# Patient Record
Sex: Female | Born: 1945
Health system: Southern US, Community
[De-identification: ages and names within clinical notes are randomized; demographics above are authoritative.]

## PROBLEM LIST (undated history)

## (undated) DIAGNOSIS — M1990 Unspecified osteoarthritis, unspecified site: Secondary | ICD-10-CM

## (undated) DIAGNOSIS — D649 Anemia, unspecified: Secondary | ICD-10-CM

## (undated) DIAGNOSIS — R05 Cough: Secondary | ICD-10-CM

## (undated) DIAGNOSIS — G629 Polyneuropathy, unspecified: Secondary | ICD-10-CM

## (undated) DIAGNOSIS — R51 Headache: Secondary | ICD-10-CM

## (undated) DIAGNOSIS — E119 Type 2 diabetes mellitus without complications: Secondary | ICD-10-CM

## (undated) DIAGNOSIS — K589 Irritable bowel syndrome without diarrhea: Secondary | ICD-10-CM

## (undated) DIAGNOSIS — E1140 Type 2 diabetes mellitus with diabetic neuropathy, unspecified: Secondary | ICD-10-CM

## (undated) DIAGNOSIS — I10 Essential (primary) hypertension: Secondary | ICD-10-CM

## (undated) DIAGNOSIS — E785 Hyperlipidemia, unspecified: Secondary | ICD-10-CM

## (undated) DIAGNOSIS — R059 Cough, unspecified: Secondary | ICD-10-CM

## (undated) DIAGNOSIS — R569 Unspecified convulsions: Secondary | ICD-10-CM

## (undated) DIAGNOSIS — J45909 Unspecified asthma, uncomplicated: Secondary | ICD-10-CM

## (undated) DIAGNOSIS — E781 Pure hyperglyceridemia: Secondary | ICD-10-CM

## (undated) DIAGNOSIS — E7800 Pure hypercholesterolemia, unspecified: Secondary | ICD-10-CM

## (undated) HISTORY — DX: Unspecified osteoarthritis, unspecified site: M19.90

## (undated) HISTORY — PX: APPENDECTOMY: SHX54

## (undated) HISTORY — DX: Anemia, unspecified: D64.9

## (undated) HISTORY — DX: Cough: R05

## (undated) HISTORY — DX: Cough, unspecified: R05.9

## (undated) HISTORY — DX: Irritable bowel syndrome, unspecified: K58.9

## (undated) HISTORY — DX: Pure hypercholesterolemia, unspecified: E78.00

## (undated) HISTORY — DX: Pure hyperglyceridemia: E78.1

## (undated) HISTORY — DX: Unspecified asthma, uncomplicated: J45.909

## (undated) HISTORY — DX: Type 2 diabetes mellitus with diabetic neuropathy, unspecified: E11.40

## (undated) HISTORY — DX: Type 2 diabetes mellitus without complications: E11.9

---

## 1992-03-02 HISTORY — PX: COLON SURGERY: SHX602

## 1997-03-02 HISTORY — PX: BREAST SURGERY: SHX581

## 2000-01-30 ENCOUNTER — Other Ambulatory Visit: Admission: RE | Admit: 2000-01-30 | Discharge: 2000-01-30 | Payer: Self-pay | Admitting: Gastroenterology

## 2000-03-02 DIAGNOSIS — E781 Pure hyperglyceridemia: Secondary | ICD-10-CM

## 2000-03-02 HISTORY — DX: Pure hyperglyceridemia: E78.1

## 2000-07-02 ENCOUNTER — Other Ambulatory Visit: Admission: RE | Admit: 2000-07-02 | Discharge: 2000-07-02 | Payer: Self-pay | Admitting: Obstetrics and Gynecology

## 2000-07-09 ENCOUNTER — Ambulatory Visit (HOSPITAL_COMMUNITY): Admission: RE | Admit: 2000-07-09 | Discharge: 2000-07-09 | Payer: Self-pay | Admitting: Obstetrics and Gynecology

## 2000-07-09 ENCOUNTER — Encounter: Payer: Self-pay | Admitting: Obstetrics and Gynecology

## 2001-07-15 ENCOUNTER — Encounter: Payer: Self-pay | Admitting: Obstetrics and Gynecology

## 2001-07-15 ENCOUNTER — Ambulatory Visit (HOSPITAL_COMMUNITY): Admission: RE | Admit: 2001-07-15 | Discharge: 2001-07-15 | Payer: Self-pay | Admitting: Obstetrics and Gynecology

## 2001-08-26 ENCOUNTER — Ambulatory Visit (HOSPITAL_BASED_OUTPATIENT_CLINIC_OR_DEPARTMENT_OTHER): Admission: RE | Admit: 2001-08-26 | Discharge: 2001-08-26 | Payer: Self-pay | Admitting: Family Medicine

## 2001-12-05 ENCOUNTER — Encounter: Payer: Self-pay | Admitting: Family Medicine

## 2001-12-05 ENCOUNTER — Ambulatory Visit (HOSPITAL_COMMUNITY): Admission: RE | Admit: 2001-12-05 | Discharge: 2001-12-05 | Payer: Self-pay | Admitting: Family Medicine

## 2001-12-28 ENCOUNTER — Encounter: Payer: Self-pay | Admitting: Neurosurgery

## 2001-12-28 ENCOUNTER — Ambulatory Visit (HOSPITAL_COMMUNITY): Admission: RE | Admit: 2001-12-28 | Discharge: 2001-12-28 | Payer: Self-pay | Admitting: Neurosurgery

## 2002-01-25 ENCOUNTER — Encounter: Payer: Self-pay | Admitting: Neurosurgery

## 2002-01-30 ENCOUNTER — Inpatient Hospital Stay (HOSPITAL_COMMUNITY): Admission: RE | Admit: 2002-01-30 | Discharge: 2002-01-31 | Payer: Self-pay | Admitting: Neurosurgery

## 2002-01-30 ENCOUNTER — Encounter: Payer: Self-pay | Admitting: Neurosurgery

## 2002-01-30 HISTORY — PX: CERVICAL SPINE SURGERY: SHX589

## 2002-06-26 ENCOUNTER — Inpatient Hospital Stay (HOSPITAL_COMMUNITY): Admission: AD | Admit: 2002-06-26 | Discharge: 2002-07-05 | Payer: Self-pay | Admitting: Family Medicine

## 2002-06-26 ENCOUNTER — Encounter: Payer: Self-pay | Admitting: Family Medicine

## 2002-06-27 ENCOUNTER — Encounter: Payer: Self-pay | Admitting: Family Medicine

## 2002-06-28 ENCOUNTER — Encounter: Payer: Self-pay | Admitting: Family Medicine

## 2002-07-04 ENCOUNTER — Encounter: Payer: Self-pay | Admitting: Family Medicine

## 2002-10-06 ENCOUNTER — Encounter (INDEPENDENT_AMBULATORY_CARE_PROVIDER_SITE_OTHER): Payer: Self-pay | Admitting: Internal Medicine

## 2002-10-06 ENCOUNTER — Ambulatory Visit (HOSPITAL_COMMUNITY): Admission: RE | Admit: 2002-10-06 | Discharge: 2002-10-06 | Payer: Self-pay | Admitting: Internal Medicine

## 2002-11-28 ENCOUNTER — Encounter: Payer: Self-pay | Admitting: Family Medicine

## 2002-11-28 ENCOUNTER — Encounter (HOSPITAL_COMMUNITY): Admission: RE | Admit: 2002-11-28 | Discharge: 2002-12-28 | Payer: Self-pay | Admitting: Family Medicine

## 2003-05-04 ENCOUNTER — Ambulatory Visit (HOSPITAL_COMMUNITY): Admission: RE | Admit: 2003-05-04 | Discharge: 2003-05-04 | Payer: Self-pay | Admitting: Family Medicine

## 2010-03-22 ENCOUNTER — Encounter: Payer: Self-pay | Admitting: Family Medicine

## 2010-08-12 ENCOUNTER — Other Ambulatory Visit: Payer: Self-pay | Admitting: Family Medicine

## 2010-08-12 DIAGNOSIS — Z139 Encounter for screening, unspecified: Secondary | ICD-10-CM

## 2010-08-18 ENCOUNTER — Ambulatory Visit (HOSPITAL_COMMUNITY): Payer: Medicare Other

## 2010-08-21 ENCOUNTER — Ambulatory Visit (HOSPITAL_COMMUNITY)
Admission: RE | Admit: 2010-08-21 | Discharge: 2010-08-21 | Disposition: A | Discharge status: Home / Self Care | Payer: Medicare Other | Source: Ambulatory Visit | Attending: Family Medicine | Admitting: Family Medicine

## 2010-08-21 DIAGNOSIS — Z1231 Encounter for screening mammogram for malignant neoplasm of breast: Secondary | ICD-10-CM | POA: Insufficient documentation

## 2010-08-21 DIAGNOSIS — Z139 Encounter for screening, unspecified: Secondary | ICD-10-CM

## 2010-10-27 ENCOUNTER — Ambulatory Visit (INDEPENDENT_AMBULATORY_CARE_PROVIDER_SITE_OTHER): Payer: Medicare Other | Admitting: Internal Medicine

## 2010-10-27 ENCOUNTER — Encounter (INDEPENDENT_AMBULATORY_CARE_PROVIDER_SITE_OTHER): Payer: Self-pay | Admitting: Internal Medicine

## 2010-10-27 ENCOUNTER — Telehealth (INDEPENDENT_AMBULATORY_CARE_PROVIDER_SITE_OTHER): Payer: Self-pay | Admitting: *Deleted

## 2010-10-27 VITALS — BP 144/72 | HR 72 | Temp 97.0°F | Ht 68.25 in | Wt 219.5 lb

## 2010-10-27 DIAGNOSIS — K625 Hemorrhage of anus and rectum: Secondary | ICD-10-CM

## 2010-10-27 MED ORDER — PEG-KCL-NACL-NASULF-NA ASC-C 100 G PO SOLR: 1.0000 | Freq: Once | ORAL | Status: DC

## 2010-10-27 NOTE — Telephone Encounter (Signed)
TCS sch'd 12/04/10 @ 3:00 (2:00), split movi prep given  ASA 2 days, hold evening dose of Metformin & glyburide evening before

## 2010-10-27 NOTE — Progress Notes (Signed)
Subjective:     Patient ID: Maria Esparza, female   DOB: 18-Jul-1945, 65 y.o.   MRN: [id number]  HPI  She was referred by [facility] for rectal bleeding. Her rectal bleeding started July 8 and has continued. She is not seeing rectal on a daily basis. She did have a small amount of blood today.   She see the blood when she wipes.  She also c/o diarrhea since July.  Her stool are soft, formed and then she will have diarrhea.   No acid reflux. She is having about 3 stools a day since July.  She tells me her stools are smaller. Her stools are brown in color. No abdominal pain. She says her abdomen rumbles.  Appetite good. Weight loss of 9-10 pounds which was intentional.. No melena.   Her last colonoscopy ? 2002.   08/18/2010 NA 140, K 4.9, Chloride 102, Glucose 168, BUN 13, Creatinine 0.85, ALP 75, AST 31, ALT 29, Total protein 7.3, albumin 4.3. Total bili 0.3  Review of Systems see hpi  Current Outpatient Prescriptions  Medication Sig Dispense Refill  . aspirin 81 MG tablet Take 81 mg by mouth daily.        . calcium carbonate (OS-CAL) 600 MG TABS Take 600 mg by mouth 2 (two) times daily before a meal. With Vitamin D       . fish oil-omega-3 fatty acids 1000 MG capsule Take 1 g by mouth daily.        Marland Kitchen glyBURIDE (DIABETA) 5 MG tablet Take 10 mg by mouth 2 (two) times daily before a meal.       . metFORMIN (GLUCOPHAGE) 500 MG tablet Take 500 mg by mouth 3 (three) times daily. Two tablet in am, one tablet at lunch and two tablets at night       . pravastatin (PRAVACHOL) 20 MG tablet Take 20 mg by mouth daily.         Past Medical History  Diagnosis Date  . Diabetes mellitus type 2, uncomplicated     x 10 yrs  . Hypercholesteremia    Past Surgical History  Procedure Date  . Colon surgery     for unknown reason.  They thought she had a mass.             Physical Exam Blood pressure 144/72, pulse 72, temperature 97 F (36.1 C), height 5' 8.25" (1.734 m), weight 219 lb 8 oz (99.565  kg). Alert and oriented. Skin warm and dry. Oral mucosa is moist. Upper and lower dentures.. Sclera anicteric, conjunctivae is pink. Thyroid not enlarged. No cervical lymphadenopathy. Lungs clear. Heart regular rate and rhythm.  Abdomen is soft. Bowel sounds are positive. Rt lower abdominal scar from previous colon surgery for a possible mass.  No hepatomegaly. No abdominal masses felt. No tenderness.  No edema to lower extremities. Patient is alert and oriented.      Assessment:   Rectal bleeding since July. Colonic neoplasm, AVM, carcinoma needs to be ruled out.   (Patient has  had 10 inches of her colon removed for possible  Mass yrs ago)     Plan:     Colonoscopy will be scheduled.The risks and benefits such as perforation, bleeding, and infection were reviewed with the patient and is agreeable.

## 2010-11-11 ENCOUNTER — Ambulatory Visit (INDEPENDENT_AMBULATORY_CARE_PROVIDER_SITE_OTHER): Payer: Medicare Other | Admitting: Internal Medicine

## 2010-12-03 MED ORDER — SODIUM CHLORIDE 0.45 % IV SOLN
Freq: Once | INTRAVENOUS | Status: AC
  Administered 2010-12-04: 14:00:00 via INTRAVENOUS

## 2010-12-04 ENCOUNTER — Ambulatory Visit (HOSPITAL_COMMUNITY)
Admission: RE | Admit: 2010-12-04 | Discharge: 2010-12-04 | Disposition: A | Discharge status: Home / Self Care | Payer: Medicare Other | Source: Ambulatory Visit | Attending: Internal Medicine | Admitting: Internal Medicine

## 2010-12-04 ENCOUNTER — Encounter (HOSPITAL_COMMUNITY)
Admission: RE | Disposition: A | Discharge status: Home / Self Care | Payer: Self-pay | Source: Ambulatory Visit | Attending: Internal Medicine

## 2010-12-04 ENCOUNTER — Encounter (HOSPITAL_COMMUNITY): Payer: Self-pay | Admitting: *Deleted

## 2010-12-04 DIAGNOSIS — K644 Residual hemorrhoidal skin tags: Secondary | ICD-10-CM

## 2010-12-04 DIAGNOSIS — K921 Melena: Secondary | ICD-10-CM | POA: Insufficient documentation

## 2010-12-04 DIAGNOSIS — K625 Hemorrhage of anus and rectum: Secondary | ICD-10-CM

## 2010-12-04 DIAGNOSIS — R197 Diarrhea, unspecified: Secondary | ICD-10-CM

## 2010-12-04 DIAGNOSIS — E119 Type 2 diabetes mellitus without complications: Secondary | ICD-10-CM | POA: Insufficient documentation

## 2010-12-04 DIAGNOSIS — E785 Hyperlipidemia, unspecified: Secondary | ICD-10-CM | POA: Insufficient documentation

## 2010-12-04 DIAGNOSIS — I10 Essential (primary) hypertension: Secondary | ICD-10-CM | POA: Insufficient documentation

## 2010-12-04 DIAGNOSIS — Z980 Intestinal bypass and anastomosis status: Secondary | ICD-10-CM | POA: Insufficient documentation

## 2010-12-04 DIAGNOSIS — Z7982 Long term (current) use of aspirin: Secondary | ICD-10-CM | POA: Insufficient documentation

## 2010-12-04 DIAGNOSIS — Z79899 Other long term (current) drug therapy: Secondary | ICD-10-CM | POA: Insufficient documentation

## 2010-12-04 HISTORY — DX: Essential (primary) hypertension: I10

## 2010-12-04 HISTORY — DX: Headache: R51

## 2010-12-04 HISTORY — DX: Hyperlipidemia, unspecified: E78.5

## 2010-12-04 HISTORY — PX: COLONOSCOPY: SHX5424

## 2010-12-04 SURGERY — COLONOSCOPY
Anesthesia: Moderate Sedation

## 2010-12-04 MED ORDER — MEPERIDINE HCL 50 MG/ML IJ SOLN
INTRAMUSCULAR | Status: AC
  Filled 2010-12-04: qty 1

## 2010-12-04 MED ORDER — MIDAZOLAM HCL 5 MG/5ML IJ SOLN
INTRAMUSCULAR | Status: DC | PRN
  Administered 2010-12-04: 1 mg via INTRAVENOUS
  Administered 2010-12-04 (×2): 2 mg via INTRAVENOUS

## 2010-12-04 MED ORDER — MIDAZOLAM HCL 5 MG/5ML IJ SOLN
INTRAMUSCULAR | Status: AC
  Filled 2010-12-04: qty 10

## 2010-12-04 MED ORDER — MEPERIDINE HCL 50 MG/ML IJ SOLN
INTRAMUSCULAR | Status: DC | PRN
  Administered 2010-12-04 (×2): 25 mg

## 2010-12-04 NOTE — OR Nursing (Signed)
At anastomosis at  1419. Per Dr. Karilyn Cota

## 2010-12-04 NOTE — H&P (Signed)
Maria Esparza is an 65 y.o. female.   Chief Complaint: Patient is here for colonoscopy HPI: Patient is 65 year old Caucasian female with intermittent rectal bleeding. Symptoms started about 3 months ago. She's also noted diarrhea for the same duration bowel frequency of once stool per day to many. He passes blood with her bowel movements and other times it does seem somewhat and she is using a pad. While her abdomen sometimes rumbles that she has not had any cramps or pain. She denies nausea vomiting fever or chills. she has not lost any weight since the symptoms began. No history of recent antibiotic use. Patient's loss colonoscopy was about 10 years ago and she believes she had few polyps removed.  Past Medical History  Diagnosis Date  . Diabetes mellitus type 2, uncomplicated     x 10 yrs  . Hypercholesteremia   . Hyperlipidemia   . Hypertension   . Diabetes mellitus   . Headache     Past Surgical History  Procedure Date  . Colon surgery 1994    for unknown reason.  They thought she had a mass.    Family History  Problem Relation Age of Onset  . Adopted: Yes   Social History:  reports that she has quit smoking. She does not have any smokeless tobacco history on file. She reports that she drinks alcohol. She reports that she does not use illicit drugs.  Allergies:  Allergies  Allergen Reactions  . Penicillins Other (See Comments)    Vomiting    Medications Prior to Admission  Medication Dose Route Frequency Provider Last Rate Last Dose  . 0.45 % sodium chloride infusion   Intravenous Once Malissa Hippo, MD 20 mL/hr at 12/04/10 1338    . meperidine (DEMEROL) 50 MG/ML injection           . midazolam (VERSED) 5 MG/5ML injection            Medications Prior to Admission  Medication Sig Dispense Refill  . aspirin EC 81 MG tablet Take 81 mg by mouth daily.        . Calcium Carbonate-Vitamin D (CALCIUM + D) 600-200 MG-UNIT TABS Take 1 tablet by mouth daily.          No  results found for this or any previous visit (from the past 48 hour(s)). No results found.  Review of Systems  Constitutional: Negative for weight loss.  Gastrointestinal: Positive for diarrhea and blood in stool. Negative for nausea, vomiting, abdominal pain, constipation and melena.    Blood pressure 139/96, pulse 76, temperature 97.6 F (36.4 C), temperature source Oral, resp. rate 20, height 5\' 8"  (1.727 m), weight 219 lb (99.338 kg), SpO2 98.00%. Physical Exam  Constitutional: She appears well-developed and well-nourished.  HENT:  Mouth/Throat: Oropharynx is clear and moist.  Eyes: Conjunctivae are normal. No scleral icterus.  Neck: No thyromegaly present.  Cardiovascular: Normal rate, regular rhythm and normal heart sounds.   No murmur heard. Respiratory: Effort normal and breath sounds normal.  GI: Soft. She exhibits no distension and no mass. There is no tenderness.  Musculoskeletal: She exhibits no edema.  Lymphadenopathy:    She has no cervical adenopathy.  Neurological: She is alert.  Skin: Skin is dry.     Assessment/Plan Rectal bleeding and diarrhea. Diagnostic colonoscopy  Myldred Raju U 12/04/2010, 1:53 PM

## 2010-12-04 NOTE — Op Note (Signed)
COLONOSCOPY PROCEDURE REPORT  PATIENT:  Maria Esparza [facility]  MR#:  [id number] Birthdate:  28-Nov-1945, 65 y.o., female Endoscopist:  Dr. Malissa Hippo, MD Referred By:  Dr. Merlyn Albert, MD Procedure Date: 12/04/2010  Procedure:   Colonoscopy  Indications:  Patient is 65 year old Caucasian female who's been experiencing diarrhea and rectal bleeding for the last couple of months. She had colonic surgery in 1994 details of which are not available.  Informed Consent: Procedure risks were reviewed the patient and informed consent was obtained.  Medications:  Demerol 50 mg IV Versed 5 mg IV  Description of procedure:  After a digital rectal exam was performed, that colonoscope was advanced from the anus through the rectum and colon to the area of ileocolonic anastomosis located in the region of hepatic flexure or a sending colon. Short segment of ileum was. The mucosal surfaces were carefully surveyed utilizing scope tip to flexion to facilitate fold flattening as needed. The scope was pulled down into the rectum where a thorough exam including retroflexion was performed.  Findings:   Prep excellent. Patent ileocolonic anastomosis located either in the region of hepatic flexure or ascending colon. Normal colonic mucosa without polyps or other abnormalities. Normal rectal mucosa. Hemorrhoids  below the dentate line along with thickening to anoderm.  Therapeutic/Diagnostic Maneuvers Performed:  None  Complications:  None  Cecal Withdrawal Time:  9 minutes  Impression:  Examination performed to ileocolonic anastomosis located either in the region of hepatic flexure ascending colon. Normal neoterminal ileum. Normal colonic mucosa. External hemorrhoids  Recommendations:  Anusol-HC suppository 1 per rectum at bedtime daily for 2 weeks. Imodium OTC 2 mg every morning. Office visit in 4 weeks.  [name] U  12/04/2010 2:28 PM  CC: Dr. Harlow Asa, MD, MD & Dr. Bonnetta Barry ref. provider  found

## 2010-12-08 ENCOUNTER — Encounter (INDEPENDENT_AMBULATORY_CARE_PROVIDER_SITE_OTHER): Payer: Self-pay | Admitting: *Deleted

## 2010-12-15 ENCOUNTER — Encounter (HOSPITAL_COMMUNITY): Payer: Self-pay | Admitting: Internal Medicine

## 2010-12-22 ENCOUNTER — Ambulatory Visit (INDEPENDENT_AMBULATORY_CARE_PROVIDER_SITE_OTHER): Payer: Medicare Other | Admitting: Internal Medicine

## 2010-12-23 ENCOUNTER — Ambulatory Visit (INDEPENDENT_AMBULATORY_CARE_PROVIDER_SITE_OTHER): Payer: Medicare Other | Admitting: Internal Medicine

## 2011-01-06 ENCOUNTER — Encounter (INDEPENDENT_AMBULATORY_CARE_PROVIDER_SITE_OTHER): Payer: Self-pay | Admitting: Internal Medicine

## 2011-01-06 ENCOUNTER — Ambulatory Visit (HOSPITAL_COMMUNITY)
Admission: RE | Admit: 2011-01-06 | Discharge: 2011-01-06 | Disposition: A | Discharge status: Home / Self Care | Payer: Medicare Other | Source: Ambulatory Visit | Attending: Family Medicine | Admitting: Family Medicine

## 2011-01-06 ENCOUNTER — Other Ambulatory Visit: Payer: Self-pay | Admitting: Family Medicine

## 2011-01-06 ENCOUNTER — Ambulatory Visit (INDEPENDENT_AMBULATORY_CARE_PROVIDER_SITE_OTHER): Payer: Medicare Other | Admitting: Internal Medicine

## 2011-01-06 VITALS — BP 162/90 | Temp 97.6°F | Ht 68.0 in | Wt 219.0 lb

## 2011-01-06 DIAGNOSIS — E7800 Pure hypercholesterolemia, unspecified: Secondary | ICD-10-CM | POA: Insufficient documentation

## 2011-01-06 DIAGNOSIS — E119 Type 2 diabetes mellitus without complications: Secondary | ICD-10-CM | POA: Insufficient documentation

## 2011-01-06 DIAGNOSIS — I10 Essential (primary) hypertension: Secondary | ICD-10-CM | POA: Insufficient documentation

## 2011-01-06 DIAGNOSIS — K625 Hemorrhage of anus and rectum: Secondary | ICD-10-CM

## 2011-01-06 DIAGNOSIS — R519 Headache, unspecified: Secondary | ICD-10-CM | POA: Insufficient documentation

## 2011-01-06 DIAGNOSIS — R51 Headache: Secondary | ICD-10-CM | POA: Insufficient documentation

## 2011-01-06 DIAGNOSIS — R059 Cough, unspecified: Secondary | ICD-10-CM

## 2011-01-06 DIAGNOSIS — R0789 Other chest pain: Secondary | ICD-10-CM | POA: Insufficient documentation

## 2011-01-06 DIAGNOSIS — R05 Cough: Secondary | ICD-10-CM | POA: Insufficient documentation

## 2011-01-06 DIAGNOSIS — K644 Residual hemorrhoidal skin tags: Secondary | ICD-10-CM

## 2011-01-06 DIAGNOSIS — E785 Hyperlipidemia, unspecified: Secondary | ICD-10-CM | POA: Insufficient documentation

## 2011-01-06 NOTE — Progress Notes (Signed)
Subjective:     Patient ID: Maria Esparza, female   DOB: December 26, 1945, 65 y.o.   MRN: [id number]   HPI  Here today for f/u after undergoing a colonoscopy 10/12. She underwent the colonoscopy for rectal bleeding.  She says she has had some rectal bleeding. Her stools are hard sometimes. Some are soft.  First  Stool is hard then the rest are liquidity. Her appetite is good. No weight loss. She denies melena or rectal bleeding.  Colonoscopy: 12/2010  Impression:  Examination performed to ileocolonic anastomosis located either in the region of hepatic flexure ascending colon.  Normal neoterminal ileum.  Normal colonic mucosa.  External hemorrhoids  Recommendations:  Anusol-HC suppository 1 per rectum at bedtime daily for 2 weeks.  Imodium OTC 2 mg every morning.  Office visit in 4 weeks.  REHMAN,NAJEEB U 12/04/2010 2:28 PM  Review of Systems Current Outpatient Prescriptions  Medication Sig Dispense Refill  . aspirin 81 MG tablet Take 81 mg by mouth daily.        . calcium carbonate (OS-CAL) 600 MG TABS Take 600 mg by mouth 2 (two) times daily before a meal. With Vitamin D       . Calcium Carbonate-Vitamin D (CALCIUM + D) 600-200 MG-UNIT TABS Take 1 tablet by mouth daily.        Marland Kitchen doxycycline (VIBRA-TABS) 100 MG tablet Take 100 mg by mouth 2 (two) times daily.        . fish oil-omega-3 fatty acids 1000 MG capsule Take 1 g by mouth daily.        Marland Kitchen glyBURIDE (DIABETA) 5 MG tablet Take 10 mg by mouth 2 (two) times daily before a meal.       . metFORMIN (GLUCOPHAGE) 500 MG tablet Take 500 mg by mouth 3 (three) times daily. Two tablet in am, one tablet at lunch and two tablets at night       . peg 3350 powder (MOVIPREP) 100 G SOLR Take 1 kit (100 g total) by mouth once.  1 kit  0  . pravastatin (PRAVACHOL) 20 MG tablet Take 20 mg by mouth daily.        Marland Kitchen aspirin EC 81 MG tablet Take 81 mg by mouth daily.         Past Surgical History  Procedure Date  . Colon surgery 1994    for unknown  reason.  They thought she had a mass.  . Colonoscopy 12/04/2010    Procedure: COLONOSCOPY;  Surgeon: Malissa Hippo, MD;  Location: [facility];  Service: Endoscopy;  Laterality: N/A;  3:00   Past Medical History  Diagnosis Date  . Diabetes mellitus type 2, uncomplicated     x 10 yrs  . Hypercholesteremia   . Hyperlipidemia   . Hypertension   . Diabetes mellitus   . Headache    History   Social History  . Marital Status: Married    Spouse Name: N/A    Number of Children: N/A  . Years of Education: N/A   Occupational History  . Not on file.   Social History Main Topics  . Smoking status: Former Smoker -- 3.0 packs/day for 20 years  . Smokeless tobacco: Not on file   Comment: Quit smoking in early 80s after smoking 20 yrs,.  . Alcohol Use: Yes     very rare occasion on a holiday  . Drug Use: No  . Sexually Active: Not on file   Other Topics Concern  . Not  on file   Social History Narrative  . No narrative on file   . Family Status  Relation Status Death Age  . Mother      Patient is adopted       Objective:   Physical Exam Filed Vitals:   01/06/11 1049  BP: 162/90  Temp: 97.6 F (36.4 C)  Height: 5\' 8"  (1.727 m)  Weight: 219 lb (99.338 kg)    Alert and oriented. Skin warm and dry. Oral mucosa is moist. Natural teeth in good condition. Sclera anicteric, conjunctivae is pink. Thyroid not enlarged. No cervical lymphadenopathy. Lungs clear. Heart regular rate and rhythm.  Abdomen is soft. Bowel sounds are positive. No hepatomegaly. No abdominal masses felt. No tenderness.  No edema to lower extremities. Patient is alert and oriented.      Assessment:   Rectal bleeding, probably hemorrhoidal.  Normal colonoscopy.     Plan:     Increase fiber in diet.Stool softner twice a day. She will follow up in one year. No straining.

## 2011-05-13 DIAGNOSIS — H6120 Impacted cerumen, unspecified ear: Secondary | ICD-10-CM | POA: Diagnosis not present

## 2011-06-10 DIAGNOSIS — E785 Hyperlipidemia, unspecified: Secondary | ICD-10-CM | POA: Diagnosis not present

## 2011-06-10 DIAGNOSIS — E119 Type 2 diabetes mellitus without complications: Secondary | ICD-10-CM | POA: Diagnosis not present

## 2011-06-17 DIAGNOSIS — E782 Mixed hyperlipidemia: Secondary | ICD-10-CM | POA: Diagnosis not present

## 2011-06-17 DIAGNOSIS — Z79899 Other long term (current) drug therapy: Secondary | ICD-10-CM | POA: Diagnosis not present

## 2011-06-17 DIAGNOSIS — E119 Type 2 diabetes mellitus without complications: Secondary | ICD-10-CM | POA: Diagnosis not present

## 2011-06-30 ENCOUNTER — Encounter (INDEPENDENT_AMBULATORY_CARE_PROVIDER_SITE_OTHER): Payer: Self-pay

## 2011-08-26 ENCOUNTER — Other Ambulatory Visit: Payer: Self-pay | Admitting: Family Medicine

## 2011-08-26 DIAGNOSIS — Z139 Encounter for screening, unspecified: Secondary | ICD-10-CM

## 2011-08-31 ENCOUNTER — Ambulatory Visit (HOSPITAL_COMMUNITY): Payer: Medicare Other

## 2011-09-08 ENCOUNTER — Ambulatory Visit (HOSPITAL_COMMUNITY)
Admission: RE | Admit: 2011-09-08 | Discharge: 2011-09-08 | Disposition: A | Discharge status: Home / Self Care | Payer: Medicare Other | Source: Ambulatory Visit | Attending: Family Medicine | Admitting: Family Medicine

## 2011-09-08 DIAGNOSIS — Z1231 Encounter for screening mammogram for malignant neoplasm of breast: Secondary | ICD-10-CM | POA: Insufficient documentation

## 2011-09-08 DIAGNOSIS — Z139 Encounter for screening, unspecified: Secondary | ICD-10-CM

## 2011-09-25 DIAGNOSIS — Z0000 Encounter for general adult medical examination without abnormal findings: Secondary | ICD-10-CM | POA: Diagnosis not present

## 2011-09-25 DIAGNOSIS — E119 Type 2 diabetes mellitus without complications: Secondary | ICD-10-CM | POA: Diagnosis not present

## 2011-09-25 DIAGNOSIS — Z9189 Other specified personal risk factors, not elsewhere classified: Secondary | ICD-10-CM | POA: Diagnosis not present

## 2011-11-11 DIAGNOSIS — H2510 Age-related nuclear cataract, unspecified eye: Secondary | ICD-10-CM | POA: Diagnosis not present

## 2011-12-09 DIAGNOSIS — L851 Acquired keratosis [keratoderma] palmaris et plantaris: Secondary | ICD-10-CM | POA: Diagnosis not present

## 2011-12-09 DIAGNOSIS — E1149 Type 2 diabetes mellitus with other diabetic neurological complication: Secondary | ICD-10-CM | POA: Diagnosis not present

## 2011-12-09 DIAGNOSIS — B351 Tinea unguium: Secondary | ICD-10-CM | POA: Diagnosis not present

## 2011-12-09 DIAGNOSIS — M2040 Other hammer toe(s) (acquired), unspecified foot: Secondary | ICD-10-CM | POA: Diagnosis not present

## 2011-12-21 DIAGNOSIS — E785 Hyperlipidemia, unspecified: Secondary | ICD-10-CM | POA: Diagnosis not present

## 2011-12-21 DIAGNOSIS — E119 Type 2 diabetes mellitus without complications: Secondary | ICD-10-CM | POA: Diagnosis not present

## 2011-12-21 DIAGNOSIS — S8490XA Injury of unspecified nerve at lower leg level, unspecified leg, initial encounter: Secondary | ICD-10-CM | POA: Diagnosis not present

## 2011-12-25 DIAGNOSIS — E782 Mixed hyperlipidemia: Secondary | ICD-10-CM | POA: Diagnosis not present

## 2011-12-25 DIAGNOSIS — Z79899 Other long term (current) drug therapy: Secondary | ICD-10-CM | POA: Diagnosis not present

## 2012-01-06 ENCOUNTER — Encounter (INDEPENDENT_AMBULATORY_CARE_PROVIDER_SITE_OTHER): Payer: Self-pay | Admitting: *Deleted

## 2012-01-26 ENCOUNTER — Ambulatory Visit (INDEPENDENT_AMBULATORY_CARE_PROVIDER_SITE_OTHER): Payer: Medicare Other | Admitting: Internal Medicine

## 2012-01-26 DIAGNOSIS — J42 Unspecified chronic bronchitis: Secondary | ICD-10-CM | POA: Diagnosis not present

## 2012-02-17 DIAGNOSIS — E1149 Type 2 diabetes mellitus with other diabetic neurological complication: Secondary | ICD-10-CM | POA: Diagnosis not present

## 2012-02-17 DIAGNOSIS — M2040 Other hammer toe(s) (acquired), unspecified foot: Secondary | ICD-10-CM | POA: Diagnosis not present

## 2012-02-17 DIAGNOSIS — B351 Tinea unguium: Secondary | ICD-10-CM | POA: Diagnosis not present

## 2012-02-17 DIAGNOSIS — L851 Acquired keratosis [keratoderma] palmaris et plantaris: Secondary | ICD-10-CM | POA: Diagnosis not present

## 2012-03-11 DIAGNOSIS — J019 Acute sinusitis, unspecified: Secondary | ICD-10-CM | POA: Diagnosis not present

## 2012-03-11 DIAGNOSIS — J111 Influenza due to unidentified influenza virus with other respiratory manifestations: Secondary | ICD-10-CM | POA: Diagnosis not present

## 2012-03-31 ENCOUNTER — Encounter (INDEPENDENT_AMBULATORY_CARE_PROVIDER_SITE_OTHER): Payer: Self-pay | Admitting: Internal Medicine

## 2012-03-31 ENCOUNTER — Ambulatory Visit (INDEPENDENT_AMBULATORY_CARE_PROVIDER_SITE_OTHER): Payer: Medicare Other | Admitting: Internal Medicine

## 2012-03-31 VITALS — BP 150/64 | HR 60 | Temp 98.1°F | Ht 68.0 in | Wt 219.9 lb

## 2012-03-31 DIAGNOSIS — R197 Diarrhea, unspecified: Secondary | ICD-10-CM | POA: Diagnosis not present

## 2012-03-31 NOTE — Patient Instructions (Addendum)
Follow up on a prn basis 

## 2012-03-31 NOTE — Progress Notes (Signed)
Subjective:     Patient ID: Maria Esparza, female   DOB: [birth year], 67 y.o.   MRN: [id number]  HPI Here today for f/u. She tells me she is doing good. She had the flu a couple of weeks. She had diarrhea associated with her symptoms. She also became very weak. She was seen by her PCP and was given an antibiotic and a cough medication Appetite is good. No dysphagia. No acid reflux. No abdominal pain. BMs are good. She usually has a BM one every 1-2 days. No melena or bright red rectal bleeding. She did see some bleeding when she had a bout of diarrhea but this cleared.  She had some type of colon mass years ago underwent surgery at [place]. Maria Esparza in [place], [place] in 1988.  She says this mass was not cancer   12/2010 Colonoscopy: Cecal Withdrawal Time: 9 minutes  Impression:  Examination performed to ileocolonic anastomosis located either in the region of hepatic flexure ascending colon.  Normal neoterminal ileum.  Normal colonic mucosa.  External hemorrhoids  Review of Systems see hpi Current Outpatient Prescriptions  Medication Sig Dispense Refill  . aspirin 81 MG tablet Take 81 mg by mouth daily.        . calcium carbonate (OS-CAL) 600 MG TABS Take 600 mg by mouth 2 (two) times daily before a meal. With Vitamin D       . fish oil-omega-3 fatty acids 1000 MG capsule Take 1 g by mouth daily.        Marland Kitchen glyBURIDE (DIABETA) 5 MG tablet Take 10 mg by mouth 2 (two) times daily before a meal.       . metFORMIN (GLUCOPHAGE) 500 MG tablet Take 500 mg by mouth 3 (three) times daily. Two tablet in am, one tablet at lunch and two tablets at night       . pravastatin (PRAVACHOL) 20 MG tablet Take 20 mg by mouth daily.        Marland Kitchen aspirin EC 81 MG tablet Take 81 mg by mouth daily.        . Calcium Carbonate-Vitamin D (CALCIUM + D) 600-200 MG-UNIT TABS Take 1 tablet by mouth daily.        Marland Kitchen doxycycline (VIBRA-TABS) 100 MG tablet Take 100 mg by mouth 2 (two) times daily.         Past Medical  History  Diagnosis Date  . Hypercholesteremia   . Hypertension   . Diabetes mellitus   . Headache   . Diabetes mellitus type 2, uncomplicated     x 10 yrs  . Hyperlipidemia    Past Surgical History  Procedure Date  . Colon surgery 1994    for unknown reason.  They thought she had a mass.  . Colonoscopy 12/04/2010    Procedure: COLONOSCOPY;  Surgeon: Malissa Hippo, MD;  Location: [facility];  Service: Endoscopy;  Laterality: N/A;  3:00   Allergies  Allergen Reactions  . Penicillins Other (See Comments)    Vomiting         Objective:   Physical Exam Filed Vitals:   03/31/12 1352  BP: 150/64  Pulse: 60  Temp: 98.1 F (36.7 C)  Height: 5\' 8"  (1.727 m)  Weight: 219 lb 14.4 oz (99.746 kg)  Alert and oriented. Skin warm and dry. Oral mucosa is moist.   . Sclera anicteric, conjunctivae is pink. Thyroid not enlarged. No cervical lymphadenopathy. Lungs clear. Heart regular rate and rhythm.  Abdomen is soft. Bowel sounds  are positive. No hepatomegaly. No abdominal masses felt. No tenderness.  No edema to lower extremities.       Assessment:    Normal exam. Diarrhea has now resolved.    Plan:    Will follow up on a prn basis.

## 2012-04-06 ENCOUNTER — Other Ambulatory Visit: Payer: Self-pay | Admitting: Family Medicine

## 2012-04-06 ENCOUNTER — Ambulatory Visit (HOSPITAL_COMMUNITY)
Admission: RE | Admit: 2012-04-06 | Discharge: 2012-04-06 | Disposition: A | Discharge status: Home / Self Care | Payer: Medicare Other | Source: Ambulatory Visit | Attending: Family Medicine | Admitting: Family Medicine

## 2012-04-06 DIAGNOSIS — M25579 Pain in unspecified ankle and joints of unspecified foot: Secondary | ICD-10-CM | POA: Diagnosis not present

## 2012-04-06 DIAGNOSIS — M79671 Pain in right foot: Secondary | ICD-10-CM

## 2012-04-06 DIAGNOSIS — R209 Unspecified disturbances of skin sensation: Secondary | ICD-10-CM | POA: Diagnosis not present

## 2012-04-06 DIAGNOSIS — G5790 Unspecified mononeuropathy of unspecified lower limb: Secondary | ICD-10-CM | POA: Diagnosis not present

## 2012-04-06 DIAGNOSIS — M7730 Calcaneal spur, unspecified foot: Secondary | ICD-10-CM | POA: Insufficient documentation

## 2012-04-06 DIAGNOSIS — M779 Enthesopathy, unspecified: Secondary | ICD-10-CM | POA: Diagnosis not present

## 2012-04-27 DIAGNOSIS — B351 Tinea unguium: Secondary | ICD-10-CM | POA: Diagnosis not present

## 2012-04-27 DIAGNOSIS — L851 Acquired keratosis [keratoderma] palmaris et plantaris: Secondary | ICD-10-CM | POA: Diagnosis not present

## 2012-04-27 DIAGNOSIS — M2040 Other hammer toe(s) (acquired), unspecified foot: Secondary | ICD-10-CM | POA: Diagnosis not present

## 2012-04-27 DIAGNOSIS — E1149 Type 2 diabetes mellitus with other diabetic neurological complication: Secondary | ICD-10-CM | POA: Diagnosis not present

## 2012-05-11 ENCOUNTER — Encounter: Payer: Self-pay | Admitting: *Deleted

## 2012-05-14 ENCOUNTER — Encounter: Payer: Self-pay | Admitting: *Deleted

## 2012-05-20 ENCOUNTER — Ambulatory Visit (INDEPENDENT_AMBULATORY_CARE_PROVIDER_SITE_OTHER): Payer: Medicare Other | Admitting: Family Medicine

## 2012-05-20 ENCOUNTER — Encounter: Payer: Self-pay | Admitting: Family Medicine

## 2012-05-20 VITALS — BP 130/88 | HR 70 | Ht 68.0 in | Wt 223.0 lb

## 2012-05-20 DIAGNOSIS — E119 Type 2 diabetes mellitus without complications: Secondary | ICD-10-CM

## 2012-05-20 DIAGNOSIS — E1140 Type 2 diabetes mellitus with diabetic neuropathy, unspecified: Secondary | ICD-10-CM

## 2012-05-20 DIAGNOSIS — E1142 Type 2 diabetes mellitus with diabetic polyneuropathy: Secondary | ICD-10-CM

## 2012-05-20 DIAGNOSIS — I10 Essential (primary) hypertension: Secondary | ICD-10-CM

## 2012-05-20 DIAGNOSIS — E1149 Type 2 diabetes mellitus with other diabetic neurological complication: Secondary | ICD-10-CM

## 2012-05-20 DIAGNOSIS — E7800 Pure hypercholesterolemia, unspecified: Secondary | ICD-10-CM

## 2012-05-20 MED ORDER — METFORMIN HCL 500 MG PO TABS: ORAL_TABLET | ORAL | Status: DC

## 2012-05-20 MED ORDER — DICLOFENAC SODIUM 75 MG PO TBEC: 75.0000 mg | DELAYED_RELEASE_TABLET | Freq: Two times a day (BID) | ORAL | Status: DC

## 2012-05-20 MED ORDER — GLYBURIDE 5 MG PO TABS: 10.0000 mg | ORAL_TABLET | Freq: Two times a day (BID) | ORAL | Status: DC

## 2012-05-20 MED ORDER — PRAVASTATIN SODIUM 20 MG PO TABS: 20.0000 mg | ORAL_TABLET | Freq: Every day | ORAL | Status: DC

## 2012-05-20 NOTE — Patient Instructions (Signed)
Be sure to exercise regularly. Take all meds as directed.

## 2012-05-20 NOTE — Progress Notes (Signed)
  Subjective:    Patient ID: Maria Esparza, female    DOB: February 06, 1946, 67 y.o.   MRN: [id number]  Toe Pain   Diabetes She has type 2 diabetes mellitus. Her disease course has been improving. There are no hypoglycemic associated symptoms. Associated symptoms include fatigue. Pertinent negatives for diabetes include no blurred vision. Symptoms are stable. Her weight is decreasing steadily. She is following a diabetic, high fat/cholesterol and low fat/cholesterol diet. Meal planning includes avoidance of concentrated sweets. She has not had a previous visit with a dietician. She participates in exercise intermittently. Her home blood glucose trend is fluctuating minimally. Her breakfast blood glucose is taken between 7-8 am. Her breakfast blood glucose range is generally 130-140 mg/dl. Eye exam is current.   Results for orders placed in visit on 05/20/12  POCT GLYCOSYLATED HEMOGLOBIN (HGB A1C)      Result Value Range   Hemoglobin A1C 6.3     Patient also notes right foot pain. Sharp in nature. Primarily great toe. Worse with certain motions. Does see a podiatrist  Review of Systems  Constitutional: Positive for fatigue. Negative for activity change.  HENT: Negative.   Eyes: Negative for blurred vision.  Respiratory: Negative for apnea.        Objective:   Physical Exam  Vitals reviewed. Constitutional: She appears well-developed and well-nourished.  HENT:  Head: Normocephalic and atraumatic.  Eyes: Conjunctivae are normal. Pupils are equal, round, and reactive to light.  Neck: Normal range of motion.  Cardiovascular: Normal rate and regular rhythm.   Pulmonary/Chest: Effort normal.  Neurological: She is alert.  Diabetic foot exam. Slight diminished sensation distally. Right greater than left. Pulses good. No significant edema. Some dorsal toe tenderness to palpation.  Skin: Skin is warm.          Assessment & Plan:  Impression #1 hypertension decent control. #2 type 2  diabetes. Good control. #3 diabetic neuropathy discussed. #4 painful toe likely secondary to neuropathy. #5 hyperlipidemia discussed. #6 mild asthma. Plan diet exercise discussed. Maintain same medications. Add Voltaren 75 twice a day when necessary for foot. Be sure to talk to podiatrist about concerns.

## 2012-05-22 ENCOUNTER — Encounter: Payer: Self-pay | Admitting: Family Medicine

## 2012-05-22 DIAGNOSIS — E1140 Type 2 diabetes mellitus with diabetic neuropathy, unspecified: Secondary | ICD-10-CM | POA: Insufficient documentation

## 2012-05-30 DIAGNOSIS — I10 Essential (primary) hypertension: Secondary | ICD-10-CM | POA: Diagnosis not present

## 2012-05-30 DIAGNOSIS — E119 Type 2 diabetes mellitus without complications: Secondary | ICD-10-CM | POA: Diagnosis not present

## 2012-05-30 DIAGNOSIS — E7800 Pure hypercholesterolemia, unspecified: Secondary | ICD-10-CM | POA: Diagnosis not present

## 2012-05-30 LAB — BASIC METABOLIC PANEL
BUN: 14 mg/dL (ref 6–23)
Calcium: 9.5 mg/dL (ref 8.4–10.5)
Glucose, Bld: 174 mg/dL — ABNORMAL HIGH (ref 70–99)
Sodium: 139 mEq/L (ref 135–145)

## 2012-05-30 LAB — LIPID PANEL
Cholesterol: 198 mg/dL (ref 0–200)
Triglycerides: 314 mg/dL — ABNORMAL HIGH (ref <150)
VLDL: 63 mg/dL — ABNORMAL HIGH (ref 0–40)

## 2012-05-30 LAB — HEPATIC FUNCTION PANEL
ALT: 20 U/L (ref 0–35)
Indirect Bilirubin: 0.3 mg/dL (ref 0.0–0.9)
Total Protein: 6.4 g/dL (ref 6.0–8.3)

## 2012-06-05 ENCOUNTER — Encounter: Payer: Self-pay | Admitting: Family Medicine

## 2012-07-05 DIAGNOSIS — M2040 Other hammer toe(s) (acquired), unspecified foot: Secondary | ICD-10-CM | POA: Diagnosis not present

## 2012-07-05 DIAGNOSIS — L851 Acquired keratosis [keratoderma] palmaris et plantaris: Secondary | ICD-10-CM | POA: Diagnosis not present

## 2012-07-05 DIAGNOSIS — B351 Tinea unguium: Secondary | ICD-10-CM | POA: Diagnosis not present

## 2012-07-05 DIAGNOSIS — E1149 Type 2 diabetes mellitus with other diabetic neurological complication: Secondary | ICD-10-CM | POA: Diagnosis not present

## 2012-07-11 ENCOUNTER — Other Ambulatory Visit: Payer: Self-pay | Admitting: Family Medicine

## 2012-09-13 DIAGNOSIS — M2040 Other hammer toe(s) (acquired), unspecified foot: Secondary | ICD-10-CM | POA: Diagnosis not present

## 2012-09-13 DIAGNOSIS — B351 Tinea unguium: Secondary | ICD-10-CM | POA: Diagnosis not present

## 2012-09-13 DIAGNOSIS — L851 Acquired keratosis [keratoderma] palmaris et plantaris: Secondary | ICD-10-CM | POA: Diagnosis not present

## 2012-09-13 DIAGNOSIS — E1149 Type 2 diabetes mellitus with other diabetic neurological complication: Secondary | ICD-10-CM | POA: Diagnosis not present

## 2012-10-06 ENCOUNTER — Other Ambulatory Visit: Payer: Self-pay | Admitting: Family Medicine

## 2012-10-06 DIAGNOSIS — Z139 Encounter for screening, unspecified: Secondary | ICD-10-CM

## 2012-10-20 ENCOUNTER — Ambulatory Visit (HOSPITAL_COMMUNITY)
Admission: RE | Admit: 2012-10-20 | Discharge: 2012-10-20 | Disposition: A | Discharge status: Home / Self Care | Payer: Medicare Other | Source: Ambulatory Visit | Attending: Family Medicine | Admitting: Family Medicine

## 2012-10-20 ENCOUNTER — Ambulatory Visit (HOSPITAL_COMMUNITY): Payer: Medicare Other

## 2012-10-20 DIAGNOSIS — Z1231 Encounter for screening mammogram for malignant neoplasm of breast: Secondary | ICD-10-CM | POA: Insufficient documentation

## 2012-10-20 DIAGNOSIS — Z139 Encounter for screening, unspecified: Secondary | ICD-10-CM

## 2012-11-15 ENCOUNTER — Encounter: Payer: Self-pay | Admitting: Family Medicine

## 2012-11-15 ENCOUNTER — Ambulatory Visit (INDEPENDENT_AMBULATORY_CARE_PROVIDER_SITE_OTHER): Payer: Medicare Other | Admitting: Family Medicine

## 2012-11-15 VITALS — BP 140/78 | Ht 68.5 in | Wt 222.0 lb

## 2012-11-15 DIAGNOSIS — R21 Rash and other nonspecific skin eruption: Secondary | ICD-10-CM | POA: Diagnosis not present

## 2012-11-15 MED ORDER — MOMETASONE FUROATE 0.1 % EX CREA: TOPICAL_CREAM | CUTANEOUS | Status: AC

## 2012-11-15 NOTE — Progress Notes (Signed)
  Subjective:    Patient ID: Maria Esparza, female    DOB: [birth year], 67 y.o.   MRN: [id number]  Rash This is a new problem. The current episode started 1 to 4 weeks ago. The problem is unchanged. The affected locations include the left hip, left arm and right arm. The rash is characterized by redness, swelling and itchiness. She was exposed to nothing. Past treatments include anti-itch cream. The treatment provided no relief.   Bad itch y rash. Started up three to four wks ago,  Used poison ivy stuff as needed  No theories, itches real bad.  Some bruising   Review of Systems  Skin: Positive for rash.   no cough no fever no rash elsewhere ROS otherwise negative.     Objective:   Physical Exam  Alert no acute distress. Left lateral hip erythematous patch several regions no blistering arms bilateral minimal similar patches lungs clear. Heart regular rate and rhythm vitals stable      Assessment & Plan:  Contact dermatitis plan Elocon cream twice a day to affected area expect slow resolution. WSL

## 2012-11-29 DIAGNOSIS — E1149 Type 2 diabetes mellitus with other diabetic neurological complication: Secondary | ICD-10-CM | POA: Diagnosis not present

## 2012-11-29 DIAGNOSIS — B351 Tinea unguium: Secondary | ICD-10-CM | POA: Diagnosis not present

## 2012-11-29 DIAGNOSIS — L851 Acquired keratosis [keratoderma] palmaris et plantaris: Secondary | ICD-10-CM | POA: Diagnosis not present

## 2012-12-13 ENCOUNTER — Ambulatory Visit (INDEPENDENT_AMBULATORY_CARE_PROVIDER_SITE_OTHER): Payer: Medicare Other | Admitting: Family Medicine

## 2012-12-13 ENCOUNTER — Encounter: Payer: Self-pay | Admitting: Family Medicine

## 2012-12-13 VITALS — BP 148/92 | Ht 68.0 in | Wt 221.4 lb

## 2012-12-13 DIAGNOSIS — Z79899 Other long term (current) drug therapy: Secondary | ICD-10-CM

## 2012-12-13 DIAGNOSIS — Z23 Encounter for immunization: Secondary | ICD-10-CM

## 2012-12-13 DIAGNOSIS — E1149 Type 2 diabetes mellitus with other diabetic neurological complication: Secondary | ICD-10-CM

## 2012-12-13 DIAGNOSIS — I10 Essential (primary) hypertension: Secondary | ICD-10-CM

## 2012-12-13 DIAGNOSIS — E785 Hyperlipidemia, unspecified: Secondary | ICD-10-CM | POA: Diagnosis not present

## 2012-12-13 DIAGNOSIS — Z0000 Encounter for general adult medical examination without abnormal findings: Secondary | ICD-10-CM

## 2012-12-13 DIAGNOSIS — E119 Type 2 diabetes mellitus without complications: Secondary | ICD-10-CM

## 2012-12-13 DIAGNOSIS — E1140 Type 2 diabetes mellitus with diabetic neuropathy, unspecified: Secondary | ICD-10-CM

## 2012-12-13 DIAGNOSIS — E1142 Type 2 diabetes mellitus with diabetic polyneuropathy: Secondary | ICD-10-CM

## 2012-12-13 LAB — POCT GLYCOSYLATED HEMOGLOBIN (HGB A1C): Hemoglobin A1C: 5.1

## 2012-12-13 MED ORDER — GLYBURIDE 5 MG PO TABS: 5.0000 mg | ORAL_TABLET | Freq: Two times a day (BID) | ORAL | Status: DC

## 2012-12-13 NOTE — Addendum Note (Signed)
Addended by: Donna Bernard on: 12/13/2012 02:05 PM   Modules accepted: Orders

## 2012-12-13 NOTE — Progress Notes (Signed)
  Subjective:    Patient ID: Maria Esparza, female    DOB: 11/12/1945, 67 y.o.   MRN: [id number]  Diabetes She has type 2 diabetes mellitus. Her disease course has been improving. Pertinent negatives for hypoglycemia include no confusion or dizziness. Pertinent negatives for hypoglycemia complications include no hospitalization. Symptoms are worsening. Diabetic complications include peripheral neuropathy. There are no known risk factors for coronary artery disease. When asked about current treatments, none were reported. She is compliant with treatment most of the time. Her weight is fluctuating minimally. She is following a diabetic and generally healthy diet. When asked about meal planning, she reported none. She participates in exercise three times a week. Her home blood glucose trend is fluctuating minimally. Her breakfast blood glucose is taken between 7-8 am. Her breakfast blood glucose range is generally 110-130 mg/dl. An ACE inhibitor/angiotensin II receptor blocker is not being taken.   Patient is here for check up. Blood sugars have ranged from 238 to 71.   Would like to be more educated on when to check her blood sugars. Sometimes checking a fasting sometimes after a meal.  No concerns.   Compliant with bp meds. Trying to watch salt intake. Walking some but not as much as she hoped.  Compliant with cholesterol medicine. Trying to watch fats in her diet. Doesn't always succeed.  Results for orders placed in visit on 12/13/12  POCT GLYCOSYLATED HEMOGLOBIN (HGB A1C)      Result Value Range   Hemoglobin A1C 5.1       Review of Systems  Neurological: Negative for dizziness.  Psychiatric/Behavioral: Negative for confusion.   no headache no chest pain no back pain ROS otherwise negative     Objective:   Physical Exam  Alert talkative no apparent distress. HEENT normal. Lungs clear. Heart regular rate and rhythm.  Feet exam see elsewhere      Assessment & Plan:   Impression #1 hypertension good control. On diet alone at this time. #2 hyperlipidemia status uncertain. #3 type 2 diabetes control 2 type rationale discussed with patient. #4 rash resolved see prior note. Plan diet exercise discussed. Appropriate blood work. Decrease glyburide to 1 tablet twice a day rationale discussed. Recheck as scheduled in several months. WSL

## 2012-12-13 NOTE — Patient Instructions (Signed)
Goal for morning fasting sugars is between 70 and 130  Please decrease your glyburide to one tablet twice per day

## 2012-12-23 DIAGNOSIS — Z79899 Other long term (current) drug therapy: Secondary | ICD-10-CM | POA: Diagnosis not present

## 2012-12-23 DIAGNOSIS — E785 Hyperlipidemia, unspecified: Secondary | ICD-10-CM | POA: Diagnosis not present

## 2012-12-23 DIAGNOSIS — E119 Type 2 diabetes mellitus without complications: Secondary | ICD-10-CM | POA: Diagnosis not present

## 2012-12-23 LAB — HEPATIC FUNCTION PANEL
AST: 22 U/L (ref 0–37)
Alkaline Phosphatase: 58 U/L (ref 39–117)
Bilirubin, Direct: 0.1 mg/dL (ref 0.0–0.3)
Indirect Bilirubin: 0.2 mg/dL (ref 0.0–0.9)
Total Bilirubin: 0.3 mg/dL (ref 0.3–1.2)

## 2012-12-23 LAB — LIPID PANEL: HDL: 48 mg/dL (ref >39)

## 2013-01-02 ENCOUNTER — Encounter: Payer: Self-pay | Admitting: Family Medicine

## 2013-01-16 ENCOUNTER — Encounter: Payer: Self-pay | Admitting: Family Medicine

## 2013-01-16 ENCOUNTER — Ambulatory Visit (INDEPENDENT_AMBULATORY_CARE_PROVIDER_SITE_OTHER): Payer: Medicare Other | Admitting: Family Medicine

## 2013-01-16 VITALS — BP 140/80 | Temp 97.8°F | Ht 68.0 in | Wt 223.8 lb

## 2013-01-16 DIAGNOSIS — M940 Chondrocostal junction syndrome [Tietze]: Secondary | ICD-10-CM | POA: Diagnosis not present

## 2013-01-16 MED ORDER — ETODOLAC 400 MG PO TABS: 400.0000 mg | ORAL_TABLET | Freq: Two times a day (BID) | ORAL | Status: DC

## 2013-01-16 MED ORDER — CHLORZOXAZONE 500 MG PO TABS: 500.0000 mg | ORAL_TABLET | Freq: Three times a day (TID) | ORAL | Status: DC

## 2013-01-16 NOTE — Progress Notes (Signed)
  Subjective:    Patient ID: Maria Esparza, female    DOB: Jul 04, 1945, 67 y.o.   MRN: [id number]  HPI  Patient arrives with complaint of right side pain for several week off and on. Worse with movement.  Last wk in oct bilat lumb painm worse with moions  Painful with movement shifts around  Pain sharp at times ,  Does not wake up umless moves.  Recall no injury or overuse, slight cough at most Review of Systems  Constitutional: Negative for activity change, appetite change and fatigue.  HENT: Negative for congestion, ear discharge and rhinorrhea.   Eyes: Negative for discharge.  Respiratory: Negative for cough, chest tightness and wheezing.   Cardiovascular: Negative for chest pain.  Gastrointestinal: Negative for vomiting and abdominal pain.  Genitourinary: Negative for frequency and difficulty urinating.  Musculoskeletal: Negative for neck pain.  Allergic/Immunologic: Negative for environmental allergies and food allergies.  Neurological: Negative for weakness and headaches.  Psychiatric/Behavioral: Negative for behavioral problems and agitation.       Objective:   Physical Exam  Alert lungs clear. Heart regular in rhythm. No spinal tenderness. Negative straight leg raise. No CVA tenderness. Lateral chest wall tender to deep palpation      Assessment & Plan:   Impression costochondritis plan diclofenac twice a day. Chlorzoxazone 3 times a day. Local measures discussed. WSL

## 2013-01-16 NOTE — Patient Instructions (Signed)
This is a thoracic strain--inflammation of the deep muscles surrounding the rib cage. Often has spasm with it, which is why both meds, anti inflam plus muscle spasm

## 2013-02-07 DIAGNOSIS — B351 Tinea unguium: Secondary | ICD-10-CM | POA: Diagnosis not present

## 2013-02-07 DIAGNOSIS — L851 Acquired keratosis [keratoderma] palmaris et plantaris: Secondary | ICD-10-CM | POA: Diagnosis not present

## 2013-02-07 DIAGNOSIS — E1149 Type 2 diabetes mellitus with other diabetic neurological complication: Secondary | ICD-10-CM | POA: Diagnosis not present

## 2013-02-14 ENCOUNTER — Encounter: Payer: Self-pay | Admitting: Family Medicine

## 2013-02-14 ENCOUNTER — Ambulatory Visit (INDEPENDENT_AMBULATORY_CARE_PROVIDER_SITE_OTHER): Payer: Medicare Other | Admitting: Family Medicine

## 2013-02-14 VITALS — BP 150/82 | Temp 98.3°F | Ht 68.0 in | Wt 222.2 lb

## 2013-02-14 DIAGNOSIS — J209 Acute bronchitis, unspecified: Secondary | ICD-10-CM | POA: Diagnosis not present

## 2013-02-14 MED ORDER — BENZONATATE 100 MG PO CAPS: 100.0000 mg | ORAL_CAPSULE | Freq: Four times a day (QID) | ORAL | Status: DC | PRN

## 2013-02-14 MED ORDER — SULFAMETHOXAZOLE-TMP DS 800-160 MG PO TABS: 1.0000 | ORAL_TABLET | Freq: Two times a day (BID) | ORAL | Status: DC

## 2013-02-14 MED ORDER — ALBUTEROL SULFATE HFA 108 (90 BASE) MCG/ACT IN AERS: 2.0000 | INHALATION_SPRAY | Freq: Four times a day (QID) | RESPIRATORY_TRACT | Status: DC | PRN

## 2013-02-14 NOTE — Progress Notes (Signed)
   Subjective:    Patient ID: Maria Esparza, female    DOB: 12/09/1945, 67 y.o.   MRN: [id number]  Cough This is a new problem. The current episode started 1 to 4 weeks ago. Associated symptoms include nasal congestion.   Persistent congested cough and maybe wheezy   Side pain from bad coug, pain right side somewhat on left. Sharp in nature worse with a deep breath.  Cough is productive of phlegm at times. No high fevers fair appetite   Review of Systems  Respiratory: Positive for cough.    no fever no chills no change in bowel habits no blood in stool ROS otherwise negative     Objective:   Physical Exam Alert mild malaise. Intermittent cough during exam. HEENT moderate nasal congestion pharynx normal neck supple. Lungs no crackles no tachypnea bronchial cough during exam heart rare rhythm.       Assessment & Plan:  Impression 1 acute bronchitis discussed plan symptomatic care discussed. Antibiotics prescribed. Symptomatic care discussed warning signs discussed. WSL

## 2013-02-18 ENCOUNTER — Emergency Department (HOSPITAL_COMMUNITY)
Admission: EM | Admit: 2013-02-18 | Discharge: 2013-02-18 | Disposition: A | Discharge status: Home / Self Care | Payer: Medicare Other | Attending: Emergency Medicine | Admitting: Emergency Medicine

## 2013-02-18 ENCOUNTER — Encounter (HOSPITAL_COMMUNITY): Payer: Self-pay | Admitting: Emergency Medicine

## 2013-02-18 ENCOUNTER — Emergency Department (HOSPITAL_COMMUNITY): Payer: Medicare Other

## 2013-02-18 DIAGNOSIS — E785 Hyperlipidemia, unspecified: Secondary | ICD-10-CM | POA: Diagnosis not present

## 2013-02-18 DIAGNOSIS — Z8719 Personal history of other diseases of the digestive system: Secondary | ICD-10-CM | POA: Diagnosis not present

## 2013-02-18 DIAGNOSIS — R509 Fever, unspecified: Secondary | ICD-10-CM | POA: Diagnosis not present

## 2013-02-18 DIAGNOSIS — I10 Essential (primary) hypertension: Secondary | ICD-10-CM | POA: Diagnosis not present

## 2013-02-18 DIAGNOSIS — Z862 Personal history of diseases of the blood and blood-forming organs and certain disorders involving the immune mechanism: Secondary | ICD-10-CM | POA: Diagnosis not present

## 2013-02-18 DIAGNOSIS — J45901 Unspecified asthma with (acute) exacerbation: Secondary | ICD-10-CM | POA: Diagnosis not present

## 2013-02-18 DIAGNOSIS — E7800 Pure hypercholesterolemia, unspecified: Secondary | ICD-10-CM | POA: Insufficient documentation

## 2013-02-18 DIAGNOSIS — Z880 Allergy status to penicillin: Secondary | ICD-10-CM | POA: Insufficient documentation

## 2013-02-18 DIAGNOSIS — Z7982 Long term (current) use of aspirin: Secondary | ICD-10-CM | POA: Insufficient documentation

## 2013-02-18 DIAGNOSIS — E1142 Type 2 diabetes mellitus with diabetic polyneuropathy: Secondary | ICD-10-CM | POA: Insufficient documentation

## 2013-02-18 DIAGNOSIS — E1149 Type 2 diabetes mellitus with other diabetic neurological complication: Secondary | ICD-10-CM | POA: Insufficient documentation

## 2013-02-18 DIAGNOSIS — J159 Unspecified bacterial pneumonia: Secondary | ICD-10-CM | POA: Diagnosis not present

## 2013-02-18 DIAGNOSIS — Z79899 Other long term (current) drug therapy: Secondary | ICD-10-CM | POA: Insufficient documentation

## 2013-02-18 DIAGNOSIS — IMO0002 Reserved for concepts with insufficient information to code with codable children: Secondary | ICD-10-CM | POA: Insufficient documentation

## 2013-02-18 DIAGNOSIS — R05 Cough: Secondary | ICD-10-CM | POA: Diagnosis not present

## 2013-02-18 DIAGNOSIS — J189 Pneumonia, unspecified organism: Secondary | ICD-10-CM

## 2013-02-18 DIAGNOSIS — Z87891 Personal history of nicotine dependence: Secondary | ICD-10-CM | POA: Diagnosis not present

## 2013-02-18 DIAGNOSIS — R059 Cough, unspecified: Secondary | ICD-10-CM | POA: Diagnosis not present

## 2013-02-18 MED ORDER — AZITHROMYCIN 250 MG PO TABS: ORAL_TABLET | ORAL | Status: DC

## 2013-02-18 MED ORDER — HYDROCOD POLST-CHLORPHEN POLST 10-8 MG/5ML PO LQCR
5.0000 mL | Freq: Once | ORAL | Status: AC
  Administered 2013-02-18: 5 mL via ORAL
  Filled 2013-02-18: qty 5

## 2013-02-18 MED ORDER — IPRATROPIUM BROMIDE 0.02 % IN SOLN
0.5000 mg | Freq: Once | RESPIRATORY_TRACT | Status: AC
  Administered 2013-02-18: 0.5 mg via RESPIRATORY_TRACT
  Filled 2013-02-18: qty 2.5

## 2013-02-18 MED ORDER — ALBUTEROL SULFATE (5 MG/ML) 0.5% IN NEBU
5.0000 mg | INHALATION_SOLUTION | Freq: Once | RESPIRATORY_TRACT | Status: AC
  Administered 2013-02-18: 5 mg via RESPIRATORY_TRACT
  Filled 2013-02-18: qty 1

## 2013-02-18 MED ORDER — HYDROCOD POLST-CHLORPHEN POLST 10-8 MG/5ML PO LQCR: 5.0000 mL | Freq: Two times a day (BID) | ORAL | Status: DC

## 2013-02-18 MED ORDER — PREDNISONE 50 MG PO TABS
60.0000 mg | ORAL_TABLET | Freq: Once | ORAL | Status: AC
  Administered 2013-02-18: 60 mg via ORAL
  Filled 2013-02-18 (×2): qty 1

## 2013-02-18 MED ORDER — PREDNISONE 20 MG PO TABS: ORAL_TABLET | ORAL | Status: DC

## 2013-02-18 NOTE — ED Provider Notes (Signed)
CSN: [id number]     Arrival date & time 02/18/13  1257 History   This chart was scribed for Donnetta Hutching, MD by Bennett Scrape, ED Scribe. This patient was seen in room APA01/APA01 and the patient's care was started at 1:14 PM.     Chief Complaint  Patient presents with  . Cough    The history is provided by the patient. No language interpreter was used.   HPI Comments: Maria Esparza is a 67 y.o. female who presents to the Emergency Department complaining of 2 weeks of persistent SOB with associated NP cough and fever. Temperature is 98.9 in the ED. She states that she has been seen multiple times by her PMD multiple times for the symptoms with no improvement.  She has tried Septra, Location manager and an albuterol inahler with no improvement. She denies having a CXR done or being on antibiotics currently. She denies smoking. She denies having a h/o pulmonary conditions or using inhalers daily at home. She states that she has a long h/o PNA and pleurisy having at least one diagnosis every year.   PCP is Dr. Lubertha South  Past Medical History  Diagnosis Date  . Hypercholesteremia   . Hypertension   . Diabetes mellitus   . Headache(784.0)   . Diabetes mellitus type 2, uncomplicated     x 10 yrs  . Hyperlipidemia   . Diabetic neuropathy rt foot  . IBS (irritable bowel syndrome)   . Asthma     mild  . Hypertriglyceridemia 2002  . Cough   . Anemia    Past Surgical History  Procedure Laterality Date  . Colon surgery  1994    for unknown reason.  They thought she had a mass.  . Colonoscopy  12/04/2010    Procedure: COLONOSCOPY;  Surgeon: Malissa Hippo, MD;  Location: [facility];  Service: Endoscopy;  Laterality: N/A;  3:00  . Appendectomy    . Breast surgery  1999    left breast biopsy  . Cervical spine surgery  december 2003   Family History  Problem Relation Age of Onset  . Adopted: Yes   History  Substance Use Topics  . Smoking status: Former Smoker -- 3.00  packs/day for 20 years  . Smokeless tobacco: Not on file     Comment: Quit smoking in early 80s after smoking 20 yrs,.  . Alcohol Use: Yes     Comment: very rare occasion on a holiday   No OB history provided.  Review of Systems  A complete 10 system review of systems was obtained and all systems are negative except as noted in the HPI and PMH.   Allergies  Penicillins and Prinivil  Home Medications   Current Outpatient Rx  Name  Route  Sig  Dispense  Refill  . albuterol (PROVENTIL HFA;VENTOLIN HFA) 108 (90 BASE) MCG/ACT inhaler   Inhalation   Inhale 2 puffs into the lungs every 6 (six) hours as needed for wheezing or shortness of breath.   18 g   5   . aspirin EC 81 MG tablet   Oral   Take 81 mg by mouth daily.           . benzonatate (TESSALON) 100 MG capsule   Oral   Take 1 capsule (100 mg total) by mouth every 6 (six) hours as needed for cough.   36 capsule   0   . calcium carbonate (OS-CAL) 600 MG TABS   Oral  Take 600 mg by mouth 2 (two) times daily before a meal. With Vitamin D          . chlorzoxazone (PARAFON) 500 MG tablet   Oral   Take 1 tablet (500 mg total) by mouth 3 (three) times daily.   30 tablet   0   . etodolac (LODINE) 400 MG tablet   Oral   Take 1 tablet (400 mg total) by mouth 2 (two) times daily.   28 tablet   1   . fish oil-omega-3 fatty acids 1000 MG capsule   Oral   Take 1 g by mouth daily.           Marland Kitchen glyBURIDE (DIABETA) 5 MG tablet   Oral   Take 1 tablet (5 mg total) by mouth 2 (two) times daily before a meal.   180 tablet   3   . metFORMIN (GLUCOPHAGE) 500 MG tablet      Two tablet in am, one tablet at lunch and two tablets at night   450 tablet   3   . mometasone (ELOCON) 0.1 % cream      Apply to affected area daily   45 g   1   . pravastatin (PRAVACHOL) 20 MG tablet      TAKE ONE TABLET BY MOUTH EVERY DAY   90 tablet   0   . sulfamethoxazole-trimethoprim (BACTRIM DS) 800-160 MG per tablet   Oral    Take 1 tablet by mouth 2 (two) times daily.   20 tablet   0    Triage Vitals: BP 136/55  Pulse 101  Temp(Src) 98.9 F (37.2 C) (Oral)  Resp 18  Ht 5' 8.25" (1.734 m)  Wt 218 lb (98.884 kg)  BMI 32.89 kg/m2  SpO2 100%  Physical Exam  Nursing note and vitals reviewed. Constitutional: She is oriented to person, place, and time. She appears well-developed and well-nourished.  HENT:  Head: Normocephalic and atraumatic.  Eyes: Conjunctivae and EOM are normal. Pupils are equal, round, and reactive to light.  Neck: Normal range of motion. Neck supple.  Cardiovascular: Normal rate, regular rhythm and normal heart sounds.   Pulmonary/Chest: Effort normal and breath sounds normal.  Tachypnea noted, NP coughing during exam  Abdominal: Soft. Bowel sounds are normal.  Musculoskeletal: Normal range of motion.  Neurological: She is alert and oriented to person, place, and time.  Skin: Skin is warm and dry.  Psychiatric: She has a normal mood and affect. Her behavior is normal.    ED Course  Procedures (including critical care time)  DIAGNOSTIC STUDIES: Oxygen Saturation is 100% on RA, normal by my interpretation.    COORDINATION OF CARE: 1:18 PM-Advised pt that her symptoms seem to be a prolonged chest cold that has inflamed her lungs and caused a persistent cough. Discussed treatment plan which includes CXR, cough syrup and breathing treatment with pt at bedside and pt agreed to plan.   Labs Review Labs Reviewed - No data to display Imaging Review Dg Chest 2 View  02/18/2013   CLINICAL DATA:  Cough, congestion and fever. History of recent fall complaining of bilateral rib pain.  EXAM: CHEST  2 VIEW  COMPARISON:  Chest x-ray 01/06/2011.  FINDINGS: Ill-defined opacity noted in the left lower lobe, concerning for pneumonia. Lungs otherwise appear clear. No pleural effusions. No evidence of pulmonary edema. Heart size and mediastinal contours are within normal limits. Atherosclerosis in  the thoracic aorta. Orthopedic fixation hardware in the lower cervical spine.  IMPRESSION: 1. Findings, as above, concerning for left lower lobe pneumonia. Repeat radiographs in 2-3 weeks after appropriate trial of antimicrobial therapy is recommended to ensure resolution of these findings. 2. Atherosclerosis.   Electronically Signed   By: Trudie Reed M.D.   On: 02/18/2013 14:25    EKG Interpretation   None       MDM  No diagnosis found. Patient is nontoxic. Chest x-ray reveals a left lower lobe pneumonia. Rx Zithromax, Tussionex, prednisone.  Patient will use her inhaler.  I personally performed the services described in this documentation, which was scribed in my presence. The recorded information has been reviewed and is accurate.    Donnetta Hutching, MD 02/18/13 [id number]

## 2013-02-18 NOTE — ED Notes (Signed)
Pt alert & oriented x4, stable gait. Patient given discharge instructions, paperwork & prescription(s). Patient  instructed to stop at the registration desk to finish any additional paperwork. Patient verbalized understanding. Pt left department w/ no further questions. 

## 2013-02-18 NOTE — ED Notes (Signed)
Complain of cough and fever. States she has been to her doctor and the meds he gave her is making her dizzy and she has been falling

## 2013-02-20 ENCOUNTER — Encounter: Payer: Self-pay | Admitting: Family Medicine

## 2013-02-20 ENCOUNTER — Telehealth: Payer: Self-pay | Admitting: Family Medicine

## 2013-02-20 ENCOUNTER — Ambulatory Visit (INDEPENDENT_AMBULATORY_CARE_PROVIDER_SITE_OTHER): Payer: Medicare Other | Admitting: Family Medicine

## 2013-02-20 VITALS — BP 134/80 | Temp 98.1°F | Ht 68.0 in | Wt 223.0 lb

## 2013-02-20 DIAGNOSIS — J189 Pneumonia, unspecified organism: Secondary | ICD-10-CM

## 2013-02-20 NOTE — Telephone Encounter (Signed)
Pt is wanting you to know she was in the ED, called the nurse hot line to which they told her to go to the ED.   She has Pneumonia now as well as fell two times since here last   Just FYI  (she says she is really frustrated at this point with it all)

## 2013-02-20 NOTE — Telephone Encounter (Signed)
Ok, will be happy to see if pt desires

## 2013-02-20 NOTE — Telephone Encounter (Signed)
Patient to call back and schedule office visit. 

## 2013-02-20 NOTE — Progress Notes (Signed)
   Subjective:    Patient ID: Maria Esparza, female    DOB: 07-Feb-1946, 67 y.o.   MRN: [id number]  [facility]. Diagnosed with pneumonia. Patient arrives extremely protracted history. She was in the emergency room. Just this Saturday. Had an x-ray. It revealed left lobar pneumonia. I had seen her the week prior with rhonchi is. We started her on Bactrim.  Patient reports diminished energy. Still has fairly severe cough at times.  Start on Zithromax in emergency room. The radiologist recommended repeating an x-ray in several weeks to assure clearance. Patient is a remote smoker.  Patient has diabetes sugars have been not in the best control lately.  Patient states that she was in twice prior for similar, but I reminded her was right sided chest wall pain tender to surface touch, and unrelated to left lobar pneumonia.  Entire emergency room records reviewed in the room    Review of Systems No chest pain no back pain no nausea no diaphoresis no shortness of breath. Patient notes she did fall twice and feeling very weak from her illness. She wonders if it was 2 to her Bactrim antibiotic.    Objective:   Physical Exam Alert slight malaise. H&T normal. Lungs clear except for left basilar crackles heart regular rate and rhythm.       Assessment & Plan:  Impression 1 pneumonia a least 10 questions answered regarding management. Plan maintain Zithromax. If on Christmas Day when Zithromax finishes start Levaquin 500 daily for 10 days if symptomatology persists. Albuterol regularly. Chest x-ray in 3 weeks. Followup middle For chronic concerns easily 25 minutes spent most in discussion.. WSL

## 2013-04-28 DIAGNOSIS — E1149 Type 2 diabetes mellitus with other diabetic neurological complication: Secondary | ICD-10-CM | POA: Diagnosis not present

## 2013-04-28 DIAGNOSIS — L851 Acquired keratosis [keratoderma] palmaris et plantaris: Secondary | ICD-10-CM | POA: Diagnosis not present

## 2013-04-28 DIAGNOSIS — B351 Tinea unguium: Secondary | ICD-10-CM | POA: Diagnosis not present

## 2013-05-21 ENCOUNTER — Other Ambulatory Visit: Payer: Self-pay | Admitting: Family Medicine

## 2013-06-22 ENCOUNTER — Ambulatory Visit (INDEPENDENT_AMBULATORY_CARE_PROVIDER_SITE_OTHER): Payer: Medicare Other | Admitting: Family Medicine

## 2013-06-22 ENCOUNTER — Encounter: Payer: Self-pay | Admitting: Family Medicine

## 2013-06-22 VITALS — BP 164/84 | Ht 68.0 in | Wt 220.0 lb

## 2013-06-22 DIAGNOSIS — E119 Type 2 diabetes mellitus without complications: Secondary | ICD-10-CM

## 2013-06-22 LAB — POCT GLYCOSYLATED HEMOGLOBIN (HGB A1C): Hemoglobin A1C: 5.3

## 2013-06-22 MED ORDER — GLYBURIDE 5 MG PO TABS: 5.0000 mg | ORAL_TABLET | Freq: Two times a day (BID) | ORAL | Status: DC

## 2013-06-22 NOTE — Progress Notes (Signed)
   Subjective:    Patient ID: Maria Esparza, female    DOB: Feb 19, 1946, 68 y.o.   MRN: [id number]  Diabetes She presents for her follow-up diabetic visit. She has type 2 diabetes mellitus. Hypoglycemia symptoms include dizziness. There are no diabetic associated symptoms. Current diabetic treatment includes oral agent (dual therapy). She is compliant with treatment all of the time. She participates in exercise intermittently. Her lunch blood glucose range is generally 130-140 mg/dl.   Notes sugars are overall ok, and between 120 and 140  occas low spells generally able to locate skipping a meal    Dieting well, Results for orders placed in visit on 06/22/13  POCT GLYCOSYLATED HEMOGLOBIN (HGB A1C)      Result Value Ref Range   Hemoglobin A1C 5.3     Patient claims compliance with her lipid medication. Trying to watch diet. Exercising some. Watch his salt intake. Watching fat intake.   Trying to watch fats, chol, and sugar  Review of Systems  Neurological: Positive for dizziness.   no chest pain no headache no back pain no change in bowel habits no blood in stool    Pt also said she has 2 spots on her face that she would like for you to check out. Slightly irritating,   Objective:   Physical Exam  Alert no apparent distress face couple small seborrheic keratoses couple small actinic keratoses neck supple. Lungs clear. Heart rare rhythm. Ankles without edema. Feet sensation intact. Pulses good.      Assessment & Plan:  Impression type 2 diabetes control tight discussed. 2 type. #2 hyperlipidemia and discuss. #3 skin lesions generic discussed plan no need for dermatologist at this time. Decrease glyburide dose. Symptomatic care discussed. Diet exercise discussed. WSL

## 2013-06-30 ENCOUNTER — Other Ambulatory Visit: Payer: Self-pay | Admitting: Family Medicine

## 2013-07-15 ENCOUNTER — Other Ambulatory Visit: Payer: Self-pay | Admitting: Family Medicine

## 2013-07-19 DIAGNOSIS — E119 Type 2 diabetes mellitus without complications: Secondary | ICD-10-CM | POA: Diagnosis not present

## 2013-07-20 LAB — HEPATIC FUNCTION PANEL
ALBUMIN: 4 g/dL (ref 3.5–5.2)
ALK PHOS: 57 U/L (ref 39–117)
ALT: 20 U/L (ref 0–35)
AST: 22 U/L (ref 0–37)
Bilirubin, Direct: 0.1 mg/dL (ref 0.0–0.3)
Indirect Bilirubin: 0.3 mg/dL (ref 0.2–1.2)
TOTAL PROTEIN: 6.9 g/dL (ref 6.0–8.3)
Total Bilirubin: 0.4 mg/dL (ref 0.2–1.2)

## 2013-07-20 LAB — LIPID PANEL
CHOLESTEROL: 184 mg/dL (ref 0–200)
HDL: 47 mg/dL (ref >39)
LDL CALC: 82 mg/dL (ref 0–99)
TRIGLYCERIDES: 273 mg/dL — AB (ref <150)
Total CHOL/HDL Ratio: 3.9 Ratio
VLDL: 55 mg/dL — ABNORMAL HIGH (ref 0–40)

## 2013-07-28 ENCOUNTER — Other Ambulatory Visit: Payer: Self-pay | Admitting: Family Medicine

## 2013-07-30 ENCOUNTER — Encounter: Payer: Self-pay | Admitting: Family Medicine

## 2013-08-26 ENCOUNTER — Other Ambulatory Visit: Payer: Self-pay | Admitting: Family Medicine

## 2013-08-29 DIAGNOSIS — E1139 Type 2 diabetes mellitus with other diabetic ophthalmic complication: Secondary | ICD-10-CM | POA: Diagnosis not present

## 2013-09-04 ENCOUNTER — Ambulatory Visit: Payer: Medicare Other | Admitting: Family Medicine

## 2013-09-28 ENCOUNTER — Ambulatory Visit (INDEPENDENT_AMBULATORY_CARE_PROVIDER_SITE_OTHER): Payer: Medicare Other | Admitting: Family Medicine

## 2013-09-28 ENCOUNTER — Encounter: Payer: Self-pay | Admitting: Family Medicine

## 2013-09-28 VITALS — BP 138/86 | Ht 68.0 in | Wt 221.0 lb

## 2013-09-28 DIAGNOSIS — Z0000 Encounter for general adult medical examination without abnormal findings: Secondary | ICD-10-CM | POA: Diagnosis not present

## 2013-09-28 NOTE — Progress Notes (Signed)
   Subjective:    Patient ID: Maria Esparza, female    DOB: 04-07-1945, 68 y.o.   MRN: [id number]  HPI AWV- Annual Wellness Visit  The patient was seen for their annual wellness visit. The patient's past medical history, surgical history, and family history were reviewed. Pertinent vaccines were reviewed ( tetanus, pneumonia, shingles, flu) The patient's medication list was reviewed and updated.  The height and weight were entered. The patient's current BMI is:33.6  Cognitive screening was completed. Outcome of Mini - Cog: pass  Falls within the past 6 months:no  Current tobacco usage: none (All patients who use tobacco were given written and verbal information on quitting)  Recent listing of emergency department/hospitalizations over the past year were reviewed.  current specialist the patient sees on a regular basis: none   Medicare annual wellness visit patient questionnaire was reviewed.  A written screening schedule for the patient for the next 5-10 years was given. Appropriate discussion of followup regarding next visit was discussed.  mammo up to date per pt,  Exercise half and half , hot and sweating less energy and so not  Mostly watching diet.  Patient concerned with ongoing intermittent right side pain, flares up when turn over, often worse for a monent   and pain in both feet with numbness, usually worse at night  Saw an eye doc, overall good  Oct 2012 colonoscopy given     Review of Systems  Constitutional: Negative for activity change, appetite change and fatigue.  HENT: Negative for congestion, ear discharge and rhinorrhea.   Eyes: Negative for discharge.  Respiratory: Negative for cough, chest tightness and wheezing.   Cardiovascular: Negative for chest pain.  Gastrointestinal: Negative for vomiting and abdominal pain.  Genitourinary: Negative for frequency and difficulty urinating.  Musculoskeletal: Negative for neck pain.    Allergic/Immunologic: Negative for environmental allergies and food allergies.  Neurological: Negative for weakness and headaches.  Psychiatric/Behavioral: Negative for behavioral problems and agitation.  All other systems reviewed and are negative.      Objective:   Physical Exam  Vitals reviewed. Constitutional: She is oriented to person, place, and time. She appears well-developed and well-nourished.  HENT:  Head: Normocephalic.  Right Ear: External ear normal.  Left Ear: External ear normal.  Eyes: Pupils are equal, round, and reactive to light.  Neck: Normal range of motion. No thyromegaly present.  Cardiovascular: Normal rate, regular rhythm, normal heart sounds and intact distal pulses.   No murmur heard. Pulmonary/Chest: Effort normal and breath sounds normal. No respiratory distress. She has no wheezes.  Abdominal: Soft. Bowel sounds are normal. She exhibits no distension and no mass. There is no tenderness.  Musculoskeletal: Normal range of motion. She exhibits no edema and no tenderness.  Lymphadenopathy:    She has no cervical adenopathy.  Neurological: She is alert and oriented to person, place, and time. She exhibits normal muscle tone.  Skin: Skin is warm and dry.  Psychiatric: She has a normal mood and affect. Her behavior is normal.          Assessment & Plan:  Impression #1 wellness exam plan patient up-to-date on colonoscopy. Given Hemoccult cards. Diet discussed. Exercise discussed. Up-to-date on pneumonia vaccinations. Encourage flu shot this fall. Anticipatory guidance given. Followup in several months as scheduled for diabetes visit. WSL

## 2013-09-30 ENCOUNTER — Other Ambulatory Visit: Payer: Self-pay | Admitting: Family Medicine

## 2013-10-04 ENCOUNTER — Other Ambulatory Visit: Payer: Self-pay | Admitting: *Deleted

## 2013-10-04 DIAGNOSIS — Z0000 Encounter for general adult medical examination without abnormal findings: Secondary | ICD-10-CM

## 2013-10-04 LAB — POC HEMOCCULT BLD/STL (HOME/3-CARD/SCREEN)
Card #2 Fecal Occult Blod, POC: NEGATIVE
FECAL OCCULT BLD: NEGATIVE
Fecal Occult Blood, POC: NEGATIVE

## 2013-10-05 NOTE — Progress Notes (Signed)
Patient notified and verbalized understanding of the test results. No further questions. 

## 2013-10-09 ENCOUNTER — Other Ambulatory Visit: Payer: Self-pay | Admitting: Family Medicine

## 2013-10-09 DIAGNOSIS — Z1231 Encounter for screening mammogram for malignant neoplasm of breast: Secondary | ICD-10-CM

## 2013-10-26 ENCOUNTER — Ambulatory Visit (HOSPITAL_COMMUNITY)
Admission: RE | Admit: 2013-10-26 | Discharge: 2013-10-26 | Disposition: A | Discharge status: Home / Self Care | Payer: Medicare Other | Source: Ambulatory Visit | Attending: Family Medicine | Admitting: Family Medicine

## 2013-10-26 DIAGNOSIS — Z1231 Encounter for screening mammogram for malignant neoplasm of breast: Secondary | ICD-10-CM

## 2013-12-12 DIAGNOSIS — L851 Acquired keratosis [keratoderma] palmaris et plantaris: Secondary | ICD-10-CM | POA: Diagnosis not present

## 2013-12-12 DIAGNOSIS — B351 Tinea unguium: Secondary | ICD-10-CM | POA: Diagnosis not present

## 2013-12-12 DIAGNOSIS — E1142 Type 2 diabetes mellitus with diabetic polyneuropathy: Secondary | ICD-10-CM | POA: Diagnosis not present

## 2013-12-19 ENCOUNTER — Ambulatory Visit: Payer: Medicare Other | Admitting: Family Medicine

## 2013-12-20 ENCOUNTER — Ambulatory Visit: Payer: Medicare Other | Admitting: Family Medicine

## 2013-12-27 ENCOUNTER — Ambulatory Visit (INDEPENDENT_AMBULATORY_CARE_PROVIDER_SITE_OTHER): Payer: Medicare Other | Admitting: Family Medicine

## 2013-12-27 ENCOUNTER — Ambulatory Visit: Payer: Medicare Other | Admitting: Family Medicine

## 2013-12-27 ENCOUNTER — Encounter: Payer: Self-pay | Admitting: Family Medicine

## 2013-12-27 VITALS — BP 112/80 | Temp 97.8°F | Ht 68.0 in | Wt 218.5 lb

## 2013-12-27 DIAGNOSIS — Z029 Encounter for administrative examinations, unspecified: Secondary | ICD-10-CM

## 2013-12-27 DIAGNOSIS — J208 Acute bronchitis due to other specified organisms: Secondary | ICD-10-CM | POA: Diagnosis not present

## 2013-12-27 DIAGNOSIS — H6123 Impacted cerumen, bilateral: Secondary | ICD-10-CM

## 2013-12-27 DIAGNOSIS — H9201 Otalgia, right ear: Secondary | ICD-10-CM | POA: Diagnosis not present

## 2013-12-27 MED ORDER — AZITHROMYCIN 250 MG PO TABS: ORAL_TABLET | ORAL | Status: DC

## 2013-12-27 MED ORDER — ALBUTEROL SULFATE HFA 108 (90 BASE) MCG/ACT IN AERS: 2.0000 | INHALATION_SPRAY | Freq: Four times a day (QID) | RESPIRATORY_TRACT | Status: DC | PRN

## 2013-12-27 NOTE — Progress Notes (Signed)
   Subjective:    Patient ID: Maria Esparza, female    DOB: 09-21-1945, 68 y.o.   MRN: [id number]  Cough This is a new problem. The current episode started in the past 7 days. The problem has been unchanged. The cough is non-productive. Associated symptoms include ear pain, rhinorrhea, shortness of breath and wheezing. Pertinent negatives include no chest pain or fever. Nothing aggravates the symptoms. She has tried OTC cough suppressant for the symptoms. The treatment provided no relief.   Patient states she has no other concerns at this time.   Reactive airway-PMH Review of Systems  Constitutional: Negative for fever and activity change.  HENT: Positive for congestion, ear pain and rhinorrhea.   Eyes: Negative for discharge.  Respiratory: Positive for cough, shortness of breath and wheezing.   Cardiovascular: Negative for chest pain.       Objective:   Physical Exam Lungs are clear cough is noted in the eardrums right eardrum with cerumen left ear drum not as bad       Assessment & Plan:  Viral syndrome secondary sinusitis antibodies prescribe warning signs discussed follow-up if problems referral to ENT for cerumen impaction and ear pain

## 2013-12-30 ENCOUNTER — Other Ambulatory Visit: Payer: Self-pay | Admitting: Family Medicine

## 2014-01-09 ENCOUNTER — Ambulatory Visit (INDEPENDENT_AMBULATORY_CARE_PROVIDER_SITE_OTHER): Payer: Medicare Other | Admitting: Family Medicine

## 2014-01-09 ENCOUNTER — Encounter: Payer: Self-pay | Admitting: Family Medicine

## 2014-01-09 VITALS — BP 138/86 | Ht 68.0 in | Wt 217.2 lb

## 2014-01-09 DIAGNOSIS — E785 Hyperlipidemia, unspecified: Secondary | ICD-10-CM | POA: Diagnosis not present

## 2014-01-09 DIAGNOSIS — Z79899 Other long term (current) drug therapy: Secondary | ICD-10-CM

## 2014-01-09 DIAGNOSIS — E119 Type 2 diabetes mellitus without complications: Secondary | ICD-10-CM | POA: Diagnosis not present

## 2014-01-09 DIAGNOSIS — I10 Essential (primary) hypertension: Secondary | ICD-10-CM

## 2014-01-09 LAB — POCT GLYCOSYLATED HEMOGLOBIN (HGB A1C): Hemoglobin A1C: 5.6

## 2014-01-09 MED ORDER — GLIPIZIDE 5 MG PO TABS: 5.0000 mg | ORAL_TABLET | Freq: Two times a day (BID) | ORAL | Status: DC

## 2014-01-09 NOTE — Progress Notes (Signed)
   Subjective:    Patient ID: Maria Esparza, female    DOB: 12-Apr-1945, 68 y.o.   MRN: [id number]  Diabetes She presents for her follow-up diabetic visit. She has type 2 diabetes mellitus. Risk factors for coronary artery disease include diabetes mellitus, obesity, post-menopausal and dyslipidemia. Current diabetic treatment includes oral agent (dual therapy). She is compliant with treatment all of the time. She is following a diabetic diet. She rarely participates in exercise. She sees a podiatrist.Eye exam is current.   Patient states she has continued cough.  Results for orders placed or performed in visit on 01/09/14  POCT glycosylated hemoglobin (Hb A1C)  Result Value Ref Range   Hemoglobin A1C 5.6    Cataracts but no diabetes changes   Patient compliant with lipid medications. Tried to cut down fats in diet with some success. No obvious side effects.  Some persistent cough no longer productive. See prior notes.  Painful great toes right more than left. Burning at times. Associated with chronic numbness.   No significant low sugar spells.  Review of Systems No chest pain no headache no back pain no abdominal pain no change in bowel habits no blood in stool    Objective:   Physical Exam  Alert HEENT normal. Lungs clear heart regular in rhythm ankles without edema distal feet sensation diminished pulses intact      Assessment & Plan:  Impression 1 type 2 diabetes tight control discussed #2 hyperlipidemia status uncertain. #3 diabetic neuropathy with secondary pain discussed. Patient not ready to try medicines yet. Plan exercise discussed encourage. Likewise diet. Appropriate blood work. Switch from glyburide to glipizide rationale discussed. Check in 6 months. WSL

## 2014-01-13 ENCOUNTER — Other Ambulatory Visit: Payer: Self-pay | Admitting: Family Medicine

## 2014-01-17 DIAGNOSIS — Z79899 Other long term (current) drug therapy: Secondary | ICD-10-CM | POA: Diagnosis not present

## 2014-01-17 DIAGNOSIS — E119 Type 2 diabetes mellitus without complications: Secondary | ICD-10-CM | POA: Diagnosis not present

## 2014-01-17 DIAGNOSIS — E785 Hyperlipidemia, unspecified: Secondary | ICD-10-CM | POA: Diagnosis not present

## 2014-01-18 LAB — BASIC METABOLIC PANEL
BUN: 12 mg/dL (ref 6–23)
CALCIUM: 9.8 mg/dL (ref 8.4–10.5)
CO2: 27 meq/L (ref 19–32)
Chloride: 98 mEq/L (ref 96–112)
Creat: 0.8 mg/dL (ref 0.50–1.10)
Glucose, Bld: 135 mg/dL — ABNORMAL HIGH (ref 70–99)
Potassium: 4.1 mEq/L (ref 3.5–5.3)
SODIUM: 135 meq/L (ref 135–145)

## 2014-01-18 LAB — LIPID PANEL
CHOL/HDL RATIO: 3.7 ratio
Cholesterol: 183 mg/dL (ref 0–200)
HDL: 50 mg/dL (ref >39)
LDL Cholesterol: 92 mg/dL (ref 0–99)
Triglycerides: 204 mg/dL — ABNORMAL HIGH (ref <150)
VLDL: 41 mg/dL — ABNORMAL HIGH (ref 0–40)

## 2014-01-18 LAB — HEPATIC FUNCTION PANEL
ALBUMIN: 4.1 g/dL (ref 3.5–5.2)
ALK PHOS: 64 U/L (ref 39–117)
ALT: 18 U/L (ref 0–35)
AST: 23 U/L (ref 0–37)
BILIRUBIN TOTAL: 0.4 mg/dL (ref 0.2–1.2)
Bilirubin, Direct: 0.1 mg/dL (ref 0.0–0.3)
Indirect Bilirubin: 0.3 mg/dL (ref 0.2–1.2)
TOTAL PROTEIN: 6.7 g/dL (ref 6.0–8.3)

## 2014-01-18 LAB — MICROALBUMIN, URINE: Microalb, Ur: 2.6 mg/dL — ABNORMAL HIGH (ref <2.0)

## 2014-01-23 ENCOUNTER — Encounter: Payer: Self-pay | Admitting: Family Medicine

## 2014-02-02 ENCOUNTER — Other Ambulatory Visit: Payer: Self-pay | Admitting: Family Medicine

## 2014-03-07 ENCOUNTER — Encounter: Payer: Self-pay | Admitting: Family Medicine

## 2014-03-07 ENCOUNTER — Ambulatory Visit (INDEPENDENT_AMBULATORY_CARE_PROVIDER_SITE_OTHER): Payer: Medicare HMO | Admitting: Family Medicine

## 2014-03-07 VITALS — BP 150/82 | Ht 68.0 in | Wt 218.2 lb

## 2014-03-07 DIAGNOSIS — I10 Essential (primary) hypertension: Secondary | ICD-10-CM

## 2014-03-07 MED ORDER — LOSARTAN POTASSIUM 50 MG PO TABS: 50.0000 mg | ORAL_TABLET | Freq: Every day | ORAL | Status: DC

## 2014-03-07 NOTE — Progress Notes (Signed)
   Subjective:    Patient ID: Maria Esparza, female    DOB: 02-01-1946, 69 y.o.   MRN: [id number]  HPI Patient arrives for elevated blood pressure and headache. Accompanied by husband. Very worried about her high blood pressure. Tried to give blood the day before and systolic was 856. At home top numbers have generally been over 150.  History of hypertension. Has been on medication in the past. Does not exercise. Tries to watch salt intake.  Patient notes diffuse mild headache. Intermittent.   Review of Systems No chest pain no back pain abdominal pain no shortness of breath    Objective:   Physical Exam  Alert somewhat anxious appearing blood pressure 152/88 on repeat lungs clear. Heart regular in rhythm.      Assessment & Plan:  Impression 1 hypertension suboptimal control with high family anxiety. Plan initiate Cozaar 50 mg daily at bedtime. Symptomatic care discussed. Follow-up as scheduled. WSL

## 2014-04-05 ENCOUNTER — Telehealth: Payer: Self-pay | Admitting: Family Medicine

## 2014-04-05 NOTE — Telephone Encounter (Signed)
losartan (COZAAR) 50 MG tablet   Pt states she was recently put on this med an now she is  Having issues with rashes. They are located on her side an  Under an on her breast.   Pt would like to be advised or the med changed?  [facility]   (new pts there )

## 2014-04-05 NOTE — Telephone Encounter (Signed)
Transferred pt up front to schedule appt with Dr Richardson Landry.

## 2014-04-06 ENCOUNTER — Ambulatory Visit (INDEPENDENT_AMBULATORY_CARE_PROVIDER_SITE_OTHER): Payer: Medicare HMO | Admitting: Family Medicine

## 2014-04-06 ENCOUNTER — Encounter: Payer: Self-pay | Admitting: Family Medicine

## 2014-04-06 VITALS — BP 150/88 | Ht 68.0 in | Wt 217.0 lb

## 2014-04-06 DIAGNOSIS — R21 Rash and other nonspecific skin eruption: Secondary | ICD-10-CM

## 2014-04-06 MED ORDER — CLOTRIMAZOLE-BETAMETHASONE 1-0.05 % EX CREA: TOPICAL_CREAM | CUTANEOUS | Status: AC

## 2014-04-06 NOTE — Progress Notes (Signed)
   Subjective:    Patient ID: Maria Esparza, female    DOB: Aug 01, 1945, 69 y.o.   MRN: [id number]  HPI  Patient arrives with rash on side and under breast for 3 weeks- itches very bad.  Patient states overall sugars good.  Itching bothering patient a lot at nighttime scratching and aching and. Claims no increased anxiety.   Trying topical agents on a without it helping any. Review of Systems No headache no chest pain no abdominal pain no change in bowel habits no blood in stool    Objective:   Physical Exam  Alert no apparent distress vital stable lungs clear heart rare rhythm scan intertrigo erythematous rash beneath each breast.      Assessment & Plan:  Impression intertrigo trigone rash with potential yeast component plan Lotrisone twice a day to affected areas symptomatic care discussed. WSL

## 2014-05-02 ENCOUNTER — Ambulatory Visit (INDEPENDENT_AMBULATORY_CARE_PROVIDER_SITE_OTHER): Payer: Medicare HMO | Admitting: Family Medicine

## 2014-05-02 ENCOUNTER — Telehealth: Payer: Self-pay | Admitting: *Deleted

## 2014-05-02 ENCOUNTER — Telehealth: Payer: Self-pay | Admitting: Family Medicine

## 2014-05-02 ENCOUNTER — Encounter: Payer: Self-pay | Admitting: Family Medicine

## 2014-05-02 VITALS — BP 140/82 | Ht 68.0 in | Wt 218.0 lb

## 2014-05-02 DIAGNOSIS — E1149 Type 2 diabetes mellitus with other diabetic neurological complication: Secondary | ICD-10-CM

## 2014-05-02 DIAGNOSIS — E1140 Type 2 diabetes mellitus with diabetic neuropathy, unspecified: Secondary | ICD-10-CM

## 2014-05-02 DIAGNOSIS — I10 Essential (primary) hypertension: Secondary | ICD-10-CM

## 2014-05-02 DIAGNOSIS — E119 Type 2 diabetes mellitus without complications: Secondary | ICD-10-CM

## 2014-05-02 DIAGNOSIS — E785 Hyperlipidemia, unspecified: Secondary | ICD-10-CM

## 2014-05-02 LAB — POCT GLYCOSYLATED HEMOGLOBIN (HGB A1C): Hemoglobin A1C: 5.2

## 2014-05-02 NOTE — Telephone Encounter (Signed)
ERROR

## 2014-05-02 NOTE — Progress Notes (Signed)
   Subjective:    Patient ID: Maria Esparza, female    DOB: 06/14/1945, 69 y.o.   MRN: [id number]  Diabetes She presents for her follow-up diabetic visit. She has type 2 diabetes mellitus. Risk factors for coronary artery disease include diabetes mellitus, dyslipidemia, hypertension, obesity and post-menopausal. Current diabetic treatment includes oral agent (dual therapy). She is following a diabetic diet. She has not had a previous visit with a dietitian. She rarely participates in exercise. She does not see a podiatrist.Eye exam is not current.   Patient arrives office for multiple concerns.claims compliance with blood pressure medicine. No obvious side effects. Handling it well. Watching salt intake. Not exercising much.  Struck right great toe last week and notes some pain. Also has chronic numbness in both feet distal feet.  States sugars generally been running overall good.  Asthma is generally stable Results for orders placed or performed in visit on 05/02/14  POCT glycosylated hemoglobin (Hb A1C)  Result Value Ref Range   Hemoglobin A1C 5.2      Review of Systems No headache no chest pain no back pain no abdominal pain no change    Objective:   Physical Exam Alert no apparent distress. Vitals stable. HEENT normal. Lungs clear. Heart regular in rhythm. Ankles without edema  Right great toe slight swelling good range of motion     Assessment & Plan:  Impression 1 hypertension good control #2 type 2 diabetes controlled tight no significant hypoglycemic episodes #3 diabetic neuropathy discussed #4 right great toe contusion highly doubt fractureplan medication clarified. Maintain glipizide. Warning signs discussed. Diet exercise discussed maintain other meds follow-up as scheduled. WSL

## 2014-05-02 NOTE — Telephone Encounter (Signed)
Ok since that's working lets do it,cancel other rx's

## 2014-05-02 NOTE — Telephone Encounter (Signed)
Patient notified and verbalized understanding. Med removed from list.

## 2014-05-02 NOTE — Telephone Encounter (Signed)
Pt seen today and was told to call back and clarify how she was taking metformin. Med list states 500mg  2 qam one noon, and 2 q pm. Pt called back and said she has not been taking metformin. She thought she was suppose to stop it when she started the glipizide. She is only taking glipizide 5mg  one bid.

## 2014-06-30 LAB — HM DIABETES EYE EXAM

## 2014-07-04 ENCOUNTER — Other Ambulatory Visit: Payer: Self-pay | Admitting: Family Medicine

## 2014-07-22 ENCOUNTER — Other Ambulatory Visit: Payer: Self-pay | Admitting: Family Medicine

## 2014-08-04 ENCOUNTER — Other Ambulatory Visit: Payer: Self-pay | Admitting: Family Medicine

## 2014-08-28 ENCOUNTER — Other Ambulatory Visit: Payer: Self-pay | Admitting: Family Medicine

## 2014-10-09 ENCOUNTER — Other Ambulatory Visit: Payer: Self-pay | Admitting: Family Medicine

## 2014-10-31 ENCOUNTER — Encounter: Payer: Self-pay | Admitting: Family Medicine

## 2014-10-31 ENCOUNTER — Ambulatory Visit (INDEPENDENT_AMBULATORY_CARE_PROVIDER_SITE_OTHER): Payer: Medicare HMO | Admitting: Family Medicine

## 2014-10-31 ENCOUNTER — Other Ambulatory Visit: Payer: Self-pay | Admitting: Family Medicine

## 2014-10-31 VITALS — BP 130/88 | Ht 68.0 in | Wt 223.0 lb

## 2014-10-31 DIAGNOSIS — I10 Essential (primary) hypertension: Secondary | ICD-10-CM | POA: Diagnosis not present

## 2014-10-31 DIAGNOSIS — Z79899 Other long term (current) drug therapy: Secondary | ICD-10-CM | POA: Diagnosis not present

## 2014-10-31 DIAGNOSIS — Z1231 Encounter for screening mammogram for malignant neoplasm of breast: Secondary | ICD-10-CM

## 2014-10-31 DIAGNOSIS — E785 Hyperlipidemia, unspecified: Secondary | ICD-10-CM

## 2014-10-31 DIAGNOSIS — E119 Type 2 diabetes mellitus without complications: Secondary | ICD-10-CM

## 2014-10-31 LAB — POCT GLYCOSYLATED HEMOGLOBIN (HGB A1C): Hemoglobin A1C: 5.4

## 2014-10-31 MED ORDER — LOSARTAN POTASSIUM 50 MG PO TABS: 50.0000 mg | ORAL_TABLET | Freq: Every day | ORAL | Status: DC

## 2014-10-31 MED ORDER — PRAVASTATIN SODIUM 20 MG PO TABS: 20.0000 mg | ORAL_TABLET | Freq: Every day | ORAL | Status: DC

## 2014-10-31 MED ORDER — GLIPIZIDE 5 MG PO TABS: ORAL_TABLET | ORAL | Status: DC

## 2014-10-31 NOTE — Progress Notes (Signed)
   Subjective:    Patient ID: Maria Esparza, female    DOB: Aug 23, 1945, 69 y.o.   MRN: [id number]  Diabetes She presents for her follow-up diabetic visit. She has type 2 diabetes mellitus. She is compliant with treatment all of the time. She is following a diabetic diet. She rarely participates in exercise. Her overall blood glucose range is >200 mg/dl. She sees a podiatrist.Eye exam is current (4 months ago).    Results for orders placed or performed in visit on 10/31/14  POCT glycosylated hemoglobin (Hb A1C)  Result Value Ref Range   Hemoglobin A1C 5.4   HM DIABETES EYE EXAM  Result Value Ref Range   HM Diabetic Eye Exam No Retinopathy No Retinopathy   Exercise not so good, no a c in the house, house like a oven and not exrcising   Patient states compliance with blood pressure medication. No obvious side effects. Watching salt intake. Does not miss a dose. Next  Compliant with lipid medicine. Has worked hard on her diet but not exercising very much. Next  Overall breathing is stable chronic cough is improved considerably   Review of Systems No headache no chest pain no back pain no abdominal pain no change in bowel habits    Objective:   Physical Exam  Alert vitals stable blood pressure good on repeat H&T normal. Lungs clear. Heart regular in rhythm. Ankles trace edema.      Assessment & Plan:  Impression 1 type 2 diabetes good control patient congratulated #2 hypertension good control discussed #3 hyperlipidemia status uncertain #4 wheezing clinically stable plan diet exercise discussed. Medications refilled. Appropriate blood work. Further recommendations based results WSL

## 2014-11-03 ENCOUNTER — Other Ambulatory Visit: Payer: Self-pay | Admitting: Family Medicine

## 2014-11-09 ENCOUNTER — Ambulatory Visit (HOSPITAL_COMMUNITY)
Admission: RE | Admit: 2014-11-09 | Discharge: 2014-11-09 | Disposition: A | Discharge status: Home / Self Care | Payer: Medicare HMO | Source: Ambulatory Visit | Attending: Family Medicine | Admitting: Family Medicine

## 2014-11-09 DIAGNOSIS — Z1231 Encounter for screening mammogram for malignant neoplasm of breast: Secondary | ICD-10-CM | POA: Diagnosis present

## 2014-11-10 LAB — LIPID PANEL
Chol/HDL Ratio: 4.2 ratio units (ref 0.0–4.4)
Cholesterol, Total: 232 mg/dL — ABNORMAL HIGH (ref 100–199)
HDL: 55 mg/dL (ref >39)
LDL Calculated: 106 mg/dL — ABNORMAL HIGH (ref 0–99)
TRIGLYCERIDES: 356 mg/dL — AB (ref 0–149)
VLDL Cholesterol Cal: 71 mg/dL — ABNORMAL HIGH (ref 5–40)

## 2014-11-10 LAB — HEPATIC FUNCTION PANEL
ALT: 45 IU/L — AB (ref 0–32)
AST: 46 IU/L — AB (ref 0–40)
Albumin: 4.3 g/dL (ref 3.5–4.8)
Alkaline Phosphatase: 74 IU/L (ref 39–117)
BILIRUBIN, DIRECT: 0.12 mg/dL (ref 0.00–0.40)
Bilirubin Total: 0.4 mg/dL (ref 0.0–1.2)
Total Protein: 6.9 g/dL (ref 6.0–8.5)

## 2014-11-26 ENCOUNTER — Encounter: Payer: Self-pay | Admitting: Family Medicine

## 2014-12-05 DIAGNOSIS — R69 Illness, unspecified: Secondary | ICD-10-CM | POA: Diagnosis not present

## 2014-12-20 ENCOUNTER — Encounter: Payer: Self-pay | Admitting: Family Medicine

## 2014-12-20 ENCOUNTER — Ambulatory Visit (HOSPITAL_COMMUNITY)
Admission: RE | Admit: 2014-12-20 | Discharge: 2014-12-20 | Disposition: A | Discharge status: Home / Self Care | Payer: Medicare HMO | Source: Ambulatory Visit | Attending: Family Medicine | Admitting: Family Medicine

## 2014-12-20 ENCOUNTER — Ambulatory Visit (INDEPENDENT_AMBULATORY_CARE_PROVIDER_SITE_OTHER): Payer: Medicare HMO | Admitting: Family Medicine

## 2014-12-20 VITALS — BP 138/86 | Ht 68.0 in | Wt 222.0 lb

## 2014-12-20 DIAGNOSIS — K921 Melena: Secondary | ICD-10-CM | POA: Diagnosis not present

## 2014-12-20 DIAGNOSIS — M79645 Pain in left finger(s): Secondary | ICD-10-CM | POA: Diagnosis not present

## 2014-12-20 DIAGNOSIS — E7800 Pure hypercholesterolemia, unspecified: Secondary | ICD-10-CM | POA: Diagnosis not present

## 2014-12-20 DIAGNOSIS — M542 Cervicalgia: Secondary | ICD-10-CM | POA: Diagnosis not present

## 2014-12-20 DIAGNOSIS — E0811 Diabetes mellitus due to underlying condition with ketoacidosis with coma: Secondary | ICD-10-CM

## 2014-12-20 DIAGNOSIS — M7989 Other specified soft tissue disorders: Secondary | ICD-10-CM | POA: Diagnosis not present

## 2014-12-20 DIAGNOSIS — Z124 Encounter for screening for malignant neoplasm of cervix: Secondary | ICD-10-CM

## 2014-12-20 DIAGNOSIS — M79642 Pain in left hand: Secondary | ICD-10-CM | POA: Insufficient documentation

## 2014-12-20 DIAGNOSIS — Z0000 Encounter for general adult medical examination without abnormal findings: Secondary | ICD-10-CM | POA: Diagnosis not present

## 2014-12-20 DIAGNOSIS — M47892 Other spondylosis, cervical region: Secondary | ICD-10-CM | POA: Diagnosis not present

## 2014-12-20 DIAGNOSIS — M4322 Fusion of spine, cervical region: Secondary | ICD-10-CM | POA: Diagnosis not present

## 2014-12-20 DIAGNOSIS — I10 Essential (primary) hypertension: Secondary | ICD-10-CM

## 2014-12-20 DIAGNOSIS — M4602 Spinal enthesopathy, cervical region: Secondary | ICD-10-CM | POA: Diagnosis not present

## 2014-12-20 DIAGNOSIS — M4802 Spinal stenosis, cervical region: Secondary | ICD-10-CM | POA: Diagnosis not present

## 2014-12-20 LAB — POCT HEMOGLOBIN: HEMOGLOBIN: 14.5 g/dL (ref 12.2–16.2)

## 2014-12-20 NOTE — Progress Notes (Signed)
Subjective:    Patient ID: Maria Esparza, female    DOB: [birth year], 69 y.o.   MRN: [id number]  HPI Patient presents supposedly for a wellness exam, but very quickly this expanded into numerous other concerns. In addition to the wellness exam.   AWV- Annual Wellness Visit  The patient was seen for their annual wellness visit. The patient's past medical history, surgical history, and family history were reviewed. Pertinent vaccines were reviewed ( tetanus, pneumonia, shingles, flu) The patient's medication list was reviewed and updated.  The height and weight were entered. The patient's current BMI is: 33.75 in   Cognitive screening was completed. Outcome of Mini - Cog: pass  Falls within the past 6 months:none  Current tobacco usage: none (All patients who use tobacco were given written and verbal information on quitting)  Recent listing of emergency department/hospitalizations over the past year were reviewed.  Camping and alking and hiking at one point, but not now  Colonoscopy oct 2012   current specialist the patient sees on a regular basis: Dr Zachery Dakins annual wellness visit patient questionnaire was reviewed.  A written screening schedule for the patient for the next 5-10 years was given. Appropriate discussion of followup regarding next visit was discussed.   Patient concerned about swelling and pain in left wrist Painful at times.dorsal wrist pain and swelling, took otc cream to see if it would help. Recalls no injury  and pain and swelling in right shoulder and neck. Very concerned about perceived swelling and even perceived mass in right supraclavicular space. Also right lateral neck pain worse with certain motions. Has been taking more aspirin for this lately.  Remarkably when I was asking her about colonoscopy patient suddenly remembered that she is had several days of passing gross amount of blood. At one point even the commode was completely  read with blood. No major abdominal or rectal pain.  Last colonoscopy 12/04/2010  Results for orders placed or performed in visit on 12/20/14  POCT hemoglobin  Result Value Ref Range   Hemoglobin 14.5 12.2 - 16.2 g/dL     Review of Systems  Constitutional: Negative for activity change, appetite change and fatigue.  HENT: Negative for congestion, ear discharge and rhinorrhea.   Eyes: Negative for discharge.  Respiratory: Negative for cough, chest tightness and wheezing.   Cardiovascular: Negative for chest pain.  Gastrointestinal: Negative for vomiting and abdominal pain.  Genitourinary: Negative for frequency and difficulty urinating.  Musculoskeletal: Negative for neck pain.  Allergic/Immunologic: Negative for environmental allergies and food allergies.  Neurological: Negative for weakness and headaches.  Psychiatric/Behavioral: Negative for behavioral problems and agitation.  All other systems reviewed and are negative.      Objective:   Physical Exam  Constitutional: She is oriented to person, place, and time. She appears well-developed and well-nourished.  Substantial obesity  HENT:  Head: Normocephalic.  Right Ear: External ear normal.  Left Ear: External ear normal.  Eyes: Pupils are equal, round, and reactive to light.  Neck: Normal range of motion. No thyromegaly present.  Cardiovascular: Normal rate, regular rhythm, normal heart sounds and intact distal pulses.   No murmur heard. Pulmonary/Chest: Effort normal and breath sounds normal. No respiratory distress. She has no wheezes.  Right supraclavicular normal fat pad  Abdominal: Soft. Bowel sounds are normal. She exhibits no distension and no mass. There is no tenderness.  Genitourinary: Vagina normal.  Pap smear obtained heme-negative stool currently  Musculoskeletal: Normal range of motion. She  exhibits no edema or tenderness.  Right lateral neck pain to deep palpation. Some pain with rotation left mid and  swelling tenderness to deep palpation pulses sensation all intact  Lymphadenopathy:    She has no cervical adenopathy.  Neurological: She is alert and oriented to person, place, and time. She exhibits normal muscle tone.  Skin: Skin is warm and dry.  Psychiatric: She has a normal mood and affect. Her behavior is normal.  Vitals reviewed.         Assessment & Plan:  Impression 1 wellness exam up-to-date on mammogram and colonoscopy #2 GI bleed. Remarkably forgotten by the patient until he started talking see above. Hemoglobin decent. Will need a rereferral to GI folks discussed #3 left mid dorsal hand pain arthritis versus tendinitis discussed #4 normal supraclavicular fat pad patient reassured #5 muscle skeletal right lateral neck pain. Plan hold off on anti-inflammatory medicine for now. GI referral. Diet exercise discussed. Appropriate blood work. Vaccines discussed. Follow-up as scheduled. WSL

## 2014-12-20 NOTE — Patient Instructions (Signed)
Two regular strength tyleno 325 mg up to four times per dat

## 2014-12-21 DIAGNOSIS — E1142 Type 2 diabetes mellitus with diabetic polyneuropathy: Secondary | ICD-10-CM | POA: Diagnosis not present

## 2014-12-21 DIAGNOSIS — L851 Acquired keratosis [keratoderma] palmaris et plantaris: Secondary | ICD-10-CM | POA: Diagnosis not present

## 2014-12-21 DIAGNOSIS — B351 Tinea unguium: Secondary | ICD-10-CM | POA: Diagnosis not present

## 2014-12-25 LAB — PAP IG W/ RFLX HPV ASCU: PAP Smear Comment: 0

## 2015-01-07 ENCOUNTER — Encounter (INDEPENDENT_AMBULATORY_CARE_PROVIDER_SITE_OTHER): Payer: Self-pay | Admitting: Internal Medicine

## 2015-01-07 ENCOUNTER — Telehealth (INDEPENDENT_AMBULATORY_CARE_PROVIDER_SITE_OTHER): Payer: Self-pay | Admitting: *Deleted

## 2015-01-07 ENCOUNTER — Other Ambulatory Visit (INDEPENDENT_AMBULATORY_CARE_PROVIDER_SITE_OTHER): Payer: Self-pay | Admitting: Internal Medicine

## 2015-01-07 ENCOUNTER — Ambulatory Visit (INDEPENDENT_AMBULATORY_CARE_PROVIDER_SITE_OTHER): Payer: Medicare HMO | Admitting: Internal Medicine

## 2015-01-07 VITALS — BP 132/70 | HR 64 | Temp 97.6°F | Ht 68.0 in | Wt 218.2 lb

## 2015-01-07 DIAGNOSIS — K625 Hemorrhage of anus and rectum: Secondary | ICD-10-CM | POA: Diagnosis not present

## 2015-01-07 DIAGNOSIS — M545 Low back pain: Secondary | ICD-10-CM | POA: Diagnosis not present

## 2015-01-07 DIAGNOSIS — M9901 Segmental and somatic dysfunction of cervical region: Secondary | ICD-10-CM | POA: Diagnosis not present

## 2015-01-07 DIAGNOSIS — M9903 Segmental and somatic dysfunction of lumbar region: Secondary | ICD-10-CM | POA: Diagnosis not present

## 2015-01-07 DIAGNOSIS — Z1211 Encounter for screening for malignant neoplasm of colon: Secondary | ICD-10-CM

## 2015-01-07 DIAGNOSIS — M542 Cervicalgia: Secondary | ICD-10-CM | POA: Diagnosis not present

## 2015-01-07 NOTE — Progress Notes (Signed)
Subjective:    Patient ID: Maria Esparza, female    DOB: September 02, 1945, 69 y.o.   MRN: [id number]  HPIAdvised to follow up with our office by Dr. Lurena Joiner. She tells me she had rectal bleeding in October. She had rectal bleeding x 2. She describes as bright red in color. She says she saw blood in the toilet x 1 and then blood on the tissue. She says was not constipated when she saw the blood.  She saw Dr Wolfgang Phoenix 12/20/2014 and advised to follow up with our office. Hemoglobin in office was 14.5.  Her appetite is good. No weight. There is no abdominal pain. No dysphagia. She has a BM daily.   She had some type of colon mass years ago underwent surgery at [facility] in [place], [place] in 1988.  She says this mass was not cancer   12/2010 Colonoscopy: Cecal Withdrawal Time: 9 minutes  Impression:  Examination performed to ileocolonic anastomosis located either in the region of hepatic flexure ascending colon.  Normal neoterminal ileum.  Normal colonic mucosa.  External hemorrhoids  Review of Systems Past Medical History  Diagnosis Date  . Hypercholesteremia   . Hypertension   . Diabetes mellitus   . Headache(784.0)   . Diabetes mellitus type 2, uncomplicated (HCC)     x 10 yrs  . Hyperlipidemia   . Diabetic neuropathy ([place]) rt foot  . IBS (irritable bowel syndrome)   . Asthma     mild  . Hypertriglyceridemia 2002  . Cough   . Anemia     Past Surgical History  Procedure Laterality Date  . Colon surgery  1994    for unknown reason.  They thought she had a mass.  . Colonoscopy  12/04/2010    Procedure: COLONOSCOPY;  Surgeon: Rogene Houston, MD;  Location: [facility];  Service: Endoscopy;  Laterality: N/A;  3:00  . Appendectomy    . Breast surgery  1999    left breast biopsy  . Cervical spine surgery  december 2003    Allergies  Allergen Reactions  . Penicillins Other (See Comments)    Vomiting  . Prinivil [Lisinopril] Cough    Current Outpatient  Prescriptions on File Prior to Visit  Medication Sig Dispense Refill  . albuterol (PROVENTIL HFA;VENTOLIN HFA) 108 (90 BASE) MCG/ACT inhaler Inhale 2 puffs into the lungs every 6 (six) hours as needed for wheezing or shortness of breath. 1 Inhaler 6  . aspirin EC 81 MG tablet Take 81 mg by mouth daily.      . calcium carbonate (OS-CAL) 600 MG TABS Take 600 mg by mouth 2 (two) times daily before a meal. With Vitamin D     . clotrimazole-betamethasone (LOTRISONE) cream Apply to affected area 2 times daily (Patient taking differently: as needed. Apply to affected area 2 times daily) 45 g 3  . fish oil-omega-3 fatty acids 1000 MG capsule Take 1 g by mouth daily.      Marland Kitchen glipiZIDE (GLUCOTROL) 5 MG tablet TAKE 1 TABLET BY MOUTH TWICE DAILY BEFORE A MEAL 180 tablet 1  . losartan (COZAAR) 50 MG tablet Take 1 tablet (50 mg total) by mouth daily. 90 tablet 1  . pravastatin (PRAVACHOL) 20 MG tablet Take 1 tablet (20 mg total) by mouth daily. 90 tablet 1   No current facility-administered medications on file prior to visit.        Objective:   Physical Exam Blood pressure 132/70, pulse 64, temperature 97.6 F (36.4 C),  height 5\' 8"  (1.727 m), weight 218 lb 3.2 oz (98.975 kg). Alert and oriented. Skin warm and dry. Oral mucosa is moist.   . Sclera anicteric, conjunctivae is pink. Thyroid not enlarged. No cervical lymphadenopathy. Lungs clear. Heart regular rate and rhythm.  Abdomen is soft. Bowel sounds are positive. No hepatomegaly. No abdominal masses felt. No tenderness.  No edema to lower extremities.  Rectal exam negative for blood.     Lot [id number] Ex 9/17     Assessment & Plan:  Rectal bleeding. Colonic neoplasm needs to be ruled out. Colonic polyp, diveticular bleed in the differential. Colonoscopy.The risks and benefits such as perforation, bleeding, and infection were reviewed with the patient and is agreeable.

## 2015-01-07 NOTE — Patient Instructions (Signed)
Colonscopy. The risks and benefits such as perforation, bleeding, and infection were reviewed with the patient and is agreeable.

## 2015-01-07 NOTE — Telephone Encounter (Signed)
Patient needs trilyte 

## 2015-01-09 DIAGNOSIS — M542 Cervicalgia: Secondary | ICD-10-CM | POA: Diagnosis not present

## 2015-01-09 DIAGNOSIS — M9901 Segmental and somatic dysfunction of cervical region: Secondary | ICD-10-CM | POA: Diagnosis not present

## 2015-01-09 DIAGNOSIS — M9903 Segmental and somatic dysfunction of lumbar region: Secondary | ICD-10-CM | POA: Diagnosis not present

## 2015-01-09 DIAGNOSIS — M545 Low back pain: Secondary | ICD-10-CM | POA: Diagnosis not present

## 2015-01-11 DIAGNOSIS — M545 Low back pain: Secondary | ICD-10-CM | POA: Diagnosis not present

## 2015-01-11 DIAGNOSIS — M9903 Segmental and somatic dysfunction of lumbar region: Secondary | ICD-10-CM | POA: Diagnosis not present

## 2015-01-11 DIAGNOSIS — M9901 Segmental and somatic dysfunction of cervical region: Secondary | ICD-10-CM | POA: Diagnosis not present

## 2015-01-11 DIAGNOSIS — M542 Cervicalgia: Secondary | ICD-10-CM | POA: Diagnosis not present

## 2015-01-11 MED ORDER — PEG 3350-KCL-NA BICARB-NACL 420 G PO SOLR: 4000.0000 mL | Freq: Once | ORAL | Status: DC

## 2015-01-14 ENCOUNTER — Ambulatory Visit (INDEPENDENT_AMBULATORY_CARE_PROVIDER_SITE_OTHER): Payer: Medicare HMO | Admitting: Internal Medicine

## 2015-01-14 ENCOUNTER — Ambulatory Visit (INDEPENDENT_AMBULATORY_CARE_PROVIDER_SITE_OTHER): Payer: Medicare HMO | Admitting: Family Medicine

## 2015-01-14 ENCOUNTER — Encounter: Payer: Self-pay | Admitting: Family Medicine

## 2015-01-14 VITALS — Temp 97.5°F | Ht 68.0 in | Wt 218.0 lb

## 2015-01-14 DIAGNOSIS — R21 Rash and other nonspecific skin eruption: Secondary | ICD-10-CM | POA: Diagnosis not present

## 2015-01-14 MED ORDER — CEPHALEXIN 500 MG PO CAPS: ORAL_CAPSULE | ORAL | Status: DC

## 2015-01-14 MED ORDER — MUPIROCIN 2 % EX OINT: TOPICAL_OINTMENT | CUTANEOUS | Status: DC

## 2015-01-14 NOTE — Progress Notes (Signed)
   Subjective:    Patient ID: Maria Esparza, female    DOB: 1945/12/26, 69 y.o.   MRN: [id number]  HPI Patient notes right ear pain. Was wearing glasses with a sharp edge. Received an injury. Since and tenderness pain and swelling.  Also would like to further discuss cervical spine x-rays see prior notes   Review of Systems No headache no chest pain no cough no sore throat    Objective:   Physical Exam  Alert vitals stable also treated region full dose of right ear slight erythema some tenderness TMs normal lungs clear heart regular in rhythm. Normal      Assessment & Plan:  Impression rash with secondary cellulitis plan antibiotics prescribed. Symptom care discussed warning signs discussed Bactroban Keflex, x-rays discussed with chronic degenerative changes WSL

## 2015-01-16 DIAGNOSIS — M9903 Segmental and somatic dysfunction of lumbar region: Secondary | ICD-10-CM | POA: Diagnosis not present

## 2015-01-16 DIAGNOSIS — M545 Low back pain: Secondary | ICD-10-CM | POA: Diagnosis not present

## 2015-01-16 DIAGNOSIS — M542 Cervicalgia: Secondary | ICD-10-CM | POA: Diagnosis not present

## 2015-01-16 DIAGNOSIS — M9901 Segmental and somatic dysfunction of cervical region: Secondary | ICD-10-CM | POA: Diagnosis not present

## 2015-01-18 ENCOUNTER — Encounter (HOSPITAL_COMMUNITY): Payer: Self-pay | Admitting: *Deleted

## 2015-01-18 ENCOUNTER — Ambulatory Visit (HOSPITAL_COMMUNITY)
Admission: RE | Admit: 2015-01-18 | Discharge: 2015-01-18 | Disposition: A | Discharge status: Home / Self Care | Payer: Medicare HMO | Source: Ambulatory Visit | Attending: Internal Medicine | Admitting: Internal Medicine

## 2015-01-18 ENCOUNTER — Encounter (HOSPITAL_COMMUNITY)
Admission: RE | Disposition: A | Discharge status: Home / Self Care | Payer: Self-pay | Source: Ambulatory Visit | Attending: Internal Medicine

## 2015-01-18 DIAGNOSIS — K921 Melena: Secondary | ICD-10-CM | POA: Diagnosis not present

## 2015-01-18 DIAGNOSIS — K648 Other hemorrhoids: Secondary | ICD-10-CM | POA: Insufficient documentation

## 2015-01-18 DIAGNOSIS — E7800 Pure hypercholesterolemia, unspecified: Secondary | ICD-10-CM | POA: Insufficient documentation

## 2015-01-18 DIAGNOSIS — I10 Essential (primary) hypertension: Secondary | ICD-10-CM | POA: Insufficient documentation

## 2015-01-18 DIAGNOSIS — K644 Residual hemorrhoidal skin tags: Secondary | ICD-10-CM | POA: Diagnosis not present

## 2015-01-18 DIAGNOSIS — Z87891 Personal history of nicotine dependence: Secondary | ICD-10-CM | POA: Insufficient documentation

## 2015-01-18 DIAGNOSIS — Z7984 Long term (current) use of oral hypoglycemic drugs: Secondary | ICD-10-CM | POA: Diagnosis not present

## 2015-01-18 DIAGNOSIS — K625 Hemorrhage of anus and rectum: Secondary | ICD-10-CM | POA: Diagnosis not present

## 2015-01-18 DIAGNOSIS — E1140 Type 2 diabetes mellitus with diabetic neuropathy, unspecified: Secondary | ICD-10-CM | POA: Insufficient documentation

## 2015-01-18 DIAGNOSIS — Z79899 Other long term (current) drug therapy: Secondary | ICD-10-CM | POA: Insufficient documentation

## 2015-01-18 DIAGNOSIS — E785 Hyperlipidemia, unspecified: Secondary | ICD-10-CM | POA: Insufficient documentation

## 2015-01-18 DIAGNOSIS — D123 Benign neoplasm of transverse colon: Secondary | ICD-10-CM | POA: Diagnosis not present

## 2015-01-18 DIAGNOSIS — Z9049 Acquired absence of other specified parts of digestive tract: Secondary | ICD-10-CM | POA: Insufficient documentation

## 2015-01-18 DIAGNOSIS — J45909 Unspecified asthma, uncomplicated: Secondary | ICD-10-CM | POA: Insufficient documentation

## 2015-01-18 DIAGNOSIS — Z7982 Long term (current) use of aspirin: Secondary | ICD-10-CM | POA: Insufficient documentation

## 2015-01-18 HISTORY — PX: COLONOSCOPY: SHX5424

## 2015-01-18 LAB — GLUCOSE, CAPILLARY: Glucose-Capillary: 218 mg/dL — ABNORMAL HIGH (ref 65–99)

## 2015-01-18 SURGERY — COLONOSCOPY
Anesthesia: Moderate Sedation

## 2015-01-18 MED ORDER — MEPERIDINE HCL 50 MG/ML IJ SOLN
INTRAMUSCULAR | Status: DC | PRN
  Administered 2015-01-18 (×2): 25 mg via INTRAVENOUS

## 2015-01-18 MED ORDER — MEPERIDINE HCL 50 MG/ML IJ SOLN
INTRAMUSCULAR | Status: DC
Start: 2015-01-18 — End: 2015-01-18
  Filled 2015-01-18: qty 1

## 2015-01-18 MED ORDER — SODIUM CHLORIDE 0.9 % IV SOLN: INTRAVENOUS | Status: DC

## 2015-01-18 MED ORDER — MIDAZOLAM HCL 5 MG/5ML IJ SOLN
INTRAMUSCULAR | Status: AC
  Filled 2015-01-18: qty 10

## 2015-01-18 MED ORDER — MIDAZOLAM HCL 5 MG/5ML IJ SOLN
INTRAMUSCULAR | Status: DC | PRN
Start: 2015-01-18 — End: 2015-01-18
  Administered 2015-01-18: 1 mg via INTRAVENOUS
  Administered 2015-01-18 (×2): 2 mg via INTRAVENOUS

## 2015-01-18 MED ORDER — STERILE WATER FOR IRRIGATION IR SOLN
Status: DC | PRN
  Administered 2015-01-18: 13:00:00

## 2015-01-18 NOTE — Op Note (Signed)
COLONOSCOPY PROCEDURE REPORT  PATIENT:  Maria Esparza  MR#:  [id number] Birthdate:  1946-01-21, 69 y.o., female Endoscopist:  Dr. Rogene Houston, MD Referred By:  Dr. Kelle Darting, MD Procedure Date: 01/18/2015  Procedure:   Colonoscopy  Indications:  Patient is 69 year old Caucasian female who presents with history of rectal bleeding. She had one episode where she passed large blood. She's had a few more episodes and she passed small amount of blood. She had right hemicolectomy years ago for unknown diagnosis but was not malignancy. Last colonoscopy was in October 2012.  Informed Consent:  The procedure and risks were reviewed with the patient and informed consent was obtained.  Medications:  Demerol 50 mg IV Versed 5 mg IV  Description of procedure:  After a digital rectal exam was performed, that colonoscope was advanced from the anus through the rectum and colon to the area ileocolonic anastomosis. Anastomosis was felt to be proximal to hepatic flexure.These structures were well-seen and photographed for the record. From this level scope was slowly and cautiously withdrawn. The mucosal surfaces were carefully surveyed utilizing scope tip to flexion to facilitate fold flattening as needed. The scope was pulled down into the rectum where a thorough exam including retroflexion was performed.  Findings:   Prep excellent. Normal mucosa of distal small bowel. Normal appearing ileocolonic anastomosis. Small polyp ablated via cold biopsy from distal transverse colon. Normal rectal mucosa. Hemorrhoids noted above and below the dentate line.   Therapeutic/Diagnostic Maneuvers Performed:  See above  Complications:  None  EBL: None  Cecal Withdrawal Time:  17 minutes  Impression:  Examination performed to ileocolonic anastomosis located in the vicinity of ascending colon. Normal mucosa of distal small bowel. Small polyp ablated via cold biopsy from transverse  colon. Normal rectal mucosa. Internal and external hemorrhoids felt to be source of rectal bleeding.  Recommendations:  Standard instructions given. I will contact patient with biopsy results and further recommendations.  Nicci Vaughan U  01/18/2015 1:18 PM  CC: Dr. Mickie Hillier, MD & Dr. Rayne Du ref. provider found

## 2015-01-18 NOTE — Discharge Instructions (Signed)
Resume usual medications and diet. °No driving for 24 hours. °Physician will call with biopsy results. ° ° °Colonoscopy, Care After °These instructions give you information on caring for yourself after your procedure. Your doctor may also give you more specific instructions. Call your doctor if you have any problems or questions after your procedure. °HOME CARE °· Do not drive for 24 hours. °· Do not sign important papers or use machinery for 24 hours. °· You may shower. °· You may go back to your usual activities, but go slower for the first 24 hours. °· Take rest breaks often during the first 24 hours. °· Walk around or use warm packs on your belly (abdomen) if you have belly cramping or gas. °· Drink enough fluids to keep your pee (urine) clear or pale yellow. °· Resume your normal diet. Avoid heavy or fried foods. °· Avoid drinking alcohol for 24 hours or as told by your doctor. °· Only take medicines as told by your doctor. °If a tissue sample (biopsy) was taken during the procedure:  °· Do not take aspirin or blood thinners for 7 days, or as told by your doctor. °· Do not drink alcohol for 7 days, or as told by your doctor. °· Eat soft foods for the first 24 hours. °GET HELP IF: °You still have a small amount of blood in your poop (stool) 2-3 days after the procedure. °GET HELP RIGHT AWAY IF: °· You have more than a small amount of blood in your poop. °· You see clumps of tissue (blood clots) in your poop. °· Your belly is puffy (swollen). °· You feel sick to your stomach (nauseous) or throw up (vomit). °· You have a fever. °· You have belly pain that gets worse and medicine does not help. °MAKE SURE YOU: °· Understand these instructions. °· Will watch your condition. °· Will get help right away if you are not doing well or get worse. °  °This information is not intended to replace advice given to you by your health care provider. Make sure you discuss any questions you have with your health care provider. °    °Document Released: 03/21/2010 Document Revised: 02/21/2013 Document Reviewed: 10/24/2012 °Elsevier Interactive Patient Education ©2016 [employer] Inc[employer] ° °

## 2015-01-18 NOTE — H&P (Signed)
Maria Esparza is an 69 y.o. female.   Chief Complaint: Patient is here for colonoscopy. HPI: She was 69 year old Caucasian female who is here for diagnostic colonoscopy. She reports large-volume hematochezia few weeks ago. She does not remember that she was constipated or had diarrhea. She also has noted blood or tissue at other times. There is no history of abdominal pain anorexia weight loss. Last colonoscopy was in October 2012. She is status post right hemicolectomy several years ago for acute abdomen. Exact diagnosis is not known. She takes low-dose aspirin and has taken full dose aspirin in addition to her usual dose for headache or muscle skeletal pain. She is not taking edition aspirin anymore. Family history is negative for CRC.  Past Medical History  Diagnosis Date  . Hypercholesteremia   . Hypertension   . Diabetes mellitus   . Headache(784.0)   . Diabetes mellitus type 2, uncomplicated (HCC)     x 10 yrs  . Hyperlipidemia   . Diabetic neuropathy ([place]) rt foot  . IBS (irritable bowel syndrome)   . Asthma     mild  . Hypertriglyceridemia 2002  . Cough   . Anemia     Past Surgical History  Procedure Laterality Date  . Colon surgery  1994    for unknown reason.  They thought she had a mass.  . Colonoscopy  12/04/2010    Procedure: COLONOSCOPY;  Surgeon: Rogene Houston, MD;  Location: [facility];  Service: Endoscopy;  Laterality: N/A;  3:00  . Appendectomy    . Breast surgery  1999    left breast biopsy  . Cervical spine surgery  december 2003    Family History  Problem Relation Age of Onset  . Adopted: Yes   Social History:  reports that she has quit smoking. She does not have any smokeless tobacco history on file. She reports that she drinks alcohol. She reports that she does not use illicit drugs.  Allergies:  Allergies  Allergen Reactions  . Penicillins Other (See Comments)    Vomiting  . Prinivil [Lisinopril] Cough    Medications Prior to  Admission  Medication Sig Dispense Refill  . aspirin EC 81 MG tablet Take 81 mg by mouth daily.      . calcium carbonate (OS-CAL) 600 MG TABS Take 600 mg by mouth 2 (two) times daily before a meal. With Vitamin D     . clotrimazole-betamethasone (LOTRISONE) cream Apply to affected area 2 times daily (Patient taking differently: as needed. Apply to affected area 2 times daily) 45 g 3  . fish oil-omega-3 fatty acids 1000 MG capsule Take 1 g by mouth daily.      Marland Kitchen glipiZIDE (GLUCOTROL) 5 MG tablet TAKE 1 TABLET BY MOUTH TWICE DAILY BEFORE A MEAL 180 tablet 1  . losartan (COZAAR) 50 MG tablet Take 1 tablet (50 mg total) by mouth daily. 90 tablet 1  . polyethylene glycol-electrolytes (NULYTELY/GOLYTELY) 420 G solution Take 4,000 mLs by mouth once. 4000 mL 0  . pravastatin (PRAVACHOL) 20 MG tablet Take 1 tablet (20 mg total) by mouth daily. 90 tablet 1  . albuterol (PROVENTIL HFA;VENTOLIN HFA) 108 (90 BASE) MCG/ACT inhaler Inhale 2 puffs into the lungs every 6 (six) hours as needed for wheezing or shortness of breath. 1 Inhaler 6  . cephALEXin (KEFLEX) 500 MG capsule One tid for seven d 21 capsule 0  . mupirocin ointment (BACTROBAN) 2 % Apply to affected area 3 times daily 22 g 0  Results for orders placed or performed during the hospital encounter of 01/18/15 (from the past 48 hour(s))  Glucose, capillary     Status: Abnormal   Collection Time: 01/18/15 10:28 AM  Result Value Ref Range   Glucose-Capillary 218 (H) 65 - 99 mg/dL   No results found.  ROS  Blood pressure 158/74, pulse 79, temperature 97.9 F (36.6 C), temperature source Oral, resp. rate 13, height 5\' 8"  (1.727 m), weight 218 lb (98.884 kg), SpO2 98 %. Physical Exam  Constitutional: She appears well-developed and well-nourished.  HENT:  Mouth/Throat: Oropharynx is clear and moist.  Eyes: Conjunctivae are normal. No scleral icterus.  Neck: No thyromegaly present.  Cardiovascular: Normal rate, regular rhythm and normal heart  sounds.   No murmur heard. Respiratory: Effort normal and breath sounds normal.  GI: Soft. She exhibits no distension and no mass. There is no tenderness.  Musculoskeletal: She exhibits no edema.  Lymphadenopathy:    She has no cervical adenopathy.  Neurological: She is alert.  Skin: Skin is warm.     Assessment/Plan Rectal bleeding. Diagnostic colonoscopy.  REHMAN,NAJEEB U 01/18/2015, 12:43 PM

## 2015-01-18 NOTE — OR Nursing (Signed)
At anastomosis at 1305

## 2015-01-21 DIAGNOSIS — M9903 Segmental and somatic dysfunction of lumbar region: Secondary | ICD-10-CM | POA: Diagnosis not present

## 2015-01-21 DIAGNOSIS — M545 Low back pain: Secondary | ICD-10-CM | POA: Diagnosis not present

## 2015-01-21 DIAGNOSIS — M542 Cervicalgia: Secondary | ICD-10-CM | POA: Diagnosis not present

## 2015-01-21 DIAGNOSIS — M9901 Segmental and somatic dysfunction of cervical region: Secondary | ICD-10-CM | POA: Diagnosis not present

## 2015-01-23 ENCOUNTER — Encounter (HOSPITAL_COMMUNITY): Payer: Self-pay | Admitting: Internal Medicine

## 2015-01-23 DIAGNOSIS — M545 Low back pain: Secondary | ICD-10-CM | POA: Diagnosis not present

## 2015-01-23 DIAGNOSIS — M9903 Segmental and somatic dysfunction of lumbar region: Secondary | ICD-10-CM | POA: Diagnosis not present

## 2015-01-23 DIAGNOSIS — M9901 Segmental and somatic dysfunction of cervical region: Secondary | ICD-10-CM | POA: Diagnosis not present

## 2015-01-23 DIAGNOSIS — M542 Cervicalgia: Secondary | ICD-10-CM | POA: Diagnosis not present

## 2015-01-28 DIAGNOSIS — M545 Low back pain: Secondary | ICD-10-CM | POA: Diagnosis not present

## 2015-01-28 DIAGNOSIS — M542 Cervicalgia: Secondary | ICD-10-CM | POA: Diagnosis not present

## 2015-01-28 DIAGNOSIS — M9903 Segmental and somatic dysfunction of lumbar region: Secondary | ICD-10-CM | POA: Diagnosis not present

## 2015-01-28 DIAGNOSIS — M9901 Segmental and somatic dysfunction of cervical region: Secondary | ICD-10-CM | POA: Diagnosis not present

## 2015-02-01 DIAGNOSIS — M9901 Segmental and somatic dysfunction of cervical region: Secondary | ICD-10-CM | POA: Diagnosis not present

## 2015-02-01 DIAGNOSIS — M542 Cervicalgia: Secondary | ICD-10-CM | POA: Diagnosis not present

## 2015-02-01 DIAGNOSIS — M545 Low back pain: Secondary | ICD-10-CM | POA: Diagnosis not present

## 2015-02-01 DIAGNOSIS — M9903 Segmental and somatic dysfunction of lumbar region: Secondary | ICD-10-CM | POA: Diagnosis not present

## 2015-02-13 ENCOUNTER — Telehealth: Payer: Self-pay | Admitting: Family Medicine

## 2015-02-13 DIAGNOSIS — R69 Illness, unspecified: Secondary | ICD-10-CM | POA: Diagnosis not present

## 2015-02-13 NOTE — Telephone Encounter (Signed)
Received form from [place] for patient diabetic  testing supplies. See Form.

## 2015-03-25 ENCOUNTER — Encounter: Payer: Self-pay | Admitting: Family Medicine

## 2015-03-25 ENCOUNTER — Ambulatory Visit (INDEPENDENT_AMBULATORY_CARE_PROVIDER_SITE_OTHER): Payer: Medicare HMO | Admitting: Family Medicine

## 2015-03-25 VITALS — BP 126/72 | Ht 68.0 in | Wt 221.5 lb

## 2015-03-25 DIAGNOSIS — Z79899 Other long term (current) drug therapy: Secondary | ICD-10-CM | POA: Diagnosis not present

## 2015-03-25 DIAGNOSIS — E119 Type 2 diabetes mellitus without complications: Secondary | ICD-10-CM

## 2015-03-25 DIAGNOSIS — E785 Hyperlipidemia, unspecified: Secondary | ICD-10-CM

## 2015-03-25 DIAGNOSIS — I10 Essential (primary) hypertension: Secondary | ICD-10-CM

## 2015-03-25 LAB — POCT GLYCOSYLATED HEMOGLOBIN (HGB A1C): Hemoglobin A1C: 6.2

## 2015-03-25 NOTE — Progress Notes (Signed)
   Subjective:    Patient ID: Maria Esparza, female    DOB: 07-08-1945, 70 y.o.   MRN: [id number]  Diabetes She presents for her follow-up diabetic visit. She has type 2 diabetes mellitus. There are no hypoglycemic associated symptoms. There are no diabetic associated symptoms. There are no hypoglycemic complications. There are no diabetic complications. There are no known risk factors for coronary artery disease. Current diabetic treatment includes oral agent (monotherapy). She is compliant with treatment all of the time.   Patient has a knot her left hand she would like the doctor to take a look at. Pain is noted. Worse with certain motions recalls no injury.  Compliant with blood pressure medicine. No obvious side effects. Meds reviewed today. Watching salt intake.  Compliant with lipid medicine. Does not miss a dose. No obvious side effects. Working hard on her fat diet. Prior lipid numbers reviewed with patient today  Compliant with meds,  Not exerfcising much these days, but working on it  Results for orders placed or performed in visit on 03/25/15  POCT glycosylated hemoglobin (Hb A1C)  Result Value Ref Range   Hemoglobin A1C 6.2    BP meds mostly compliant.  Review of Systems No headache no chest pain no back pain abdominal pain no change in bowel habits no blood in stool ROS otherwise negative    Objective:   Physical Exam  Alert vitals stable. Blood pressure good on repeat HEENT normal. Lungs clear. Heart regular rhythm ankles without edema C diabetic foot exam left dorsal hand soft tissue region ganglion cyst versus lipoma x-ray reviewed with patient      Assessment & Plan:  Impression 1 type 2 diabetes good control. Patient brings in fasting sugars quite elevated. Needs to check her machine and consider getting a new one. Or compared to our's #2 hypertension good control measure due to maintain same #3 left dorsal hand ganglion ounces versus lipoma discuss #4  hyperlipidemia. Current status uncertain. Old numbers reviewed. Plan diet exercise discussed. Meds reviewed. Appropriate blood work. Other orders as noted above WSL

## 2015-03-26 DIAGNOSIS — B351 Tinea unguium: Secondary | ICD-10-CM | POA: Diagnosis not present

## 2015-03-26 DIAGNOSIS — E1142 Type 2 diabetes mellitus with diabetic polyneuropathy: Secondary | ICD-10-CM | POA: Diagnosis not present

## 2015-03-26 DIAGNOSIS — L851 Acquired keratosis [keratoderma] palmaris et plantaris: Secondary | ICD-10-CM | POA: Diagnosis not present

## 2015-04-23 DIAGNOSIS — R69 Illness, unspecified: Secondary | ICD-10-CM | POA: Diagnosis not present

## 2015-04-26 ENCOUNTER — Encounter: Payer: Self-pay | Admitting: Family Medicine

## 2015-04-26 ENCOUNTER — Ambulatory Visit (INDEPENDENT_AMBULATORY_CARE_PROVIDER_SITE_OTHER): Payer: Medicare HMO | Admitting: Family Medicine

## 2015-04-26 VITALS — BP 134/80 | Temp 97.9°F | Ht 68.0 in | Wt 216.5 lb

## 2015-04-26 DIAGNOSIS — R55 Syncope and collapse: Secondary | ICD-10-CM

## 2015-04-26 LAB — POCT HEMOGLOBIN: HEMOGLOBIN: 13.7 g/dL (ref 12.2–16.2)

## 2015-04-26 NOTE — Progress Notes (Signed)
   Subjective:    Patient ID: Maria Esparza, female    DOB: [birth year], 70 y.o.   MRN: [id number]  HPI Patient states that on Monday she started feeling dizzy and she passed out. Headache was noted also. Pt Happened only 1 time.   Pt blacked out, thought she could hear someone talking. Pt shook "kind f like an epileptic fit" according to granddaughter.     Since then patient has felt fine. No chest pain no palpitations. No headache no abdominal pain. No lightheadedness.   Pt blacked out into the chair, out of it, not sure for how long., But thinks that it was relatively brief  Pt feels unsteady at times, but has not completely passed out or fell before.     Patient has no other concerns at this time.      Review of Systems See above    Objective:   Physical Exam Alert vitals stable. HEENT normal. Lungs clear. Heart regular rate and rhythm neuro exam intact no significant orthostatic changes next  EKG normal sinus rhythm with partial bundle branch block       Assessment & Plan:  Impression syncopal event etiology unclear. Patient does have numerous cardiovascular risk factors. Discussed plan cardiology referral warning signs discussed WSL

## 2015-05-03 ENCOUNTER — Encounter: Payer: Self-pay | Admitting: Family Medicine

## 2015-05-06 ENCOUNTER — Telehealth: Payer: Self-pay | Admitting: Family Medicine

## 2015-05-06 NOTE — Telephone Encounter (Signed)
Called and informed patient per Dr.Steve Luking-Office visit  later this week with carolyn bring machine in at the time. Patient verbalized understanding and was transferred to the front desk to schedule appointment.

## 2015-05-06 NOTE — Telephone Encounter (Signed)
Pt took your advice and got a new glucose machine She states that since that time her sugars have been running  In the 300 close to 400, she states she has been light headed at times And sleepy/weak feeling at times but not all the time.   Wants to know if she needs to be seen or does the machine need to  Be looked at, she is unsure here sugars are really that high.  Please  Advise   Between 12-4 call the cell number [phone]  Other wise call the home number

## 2015-05-06 NOTE — Telephone Encounter (Signed)
Ov later this wk with carolyn bring [id number] in at the time

## 2015-05-08 ENCOUNTER — Ambulatory Visit (INDEPENDENT_AMBULATORY_CARE_PROVIDER_SITE_OTHER): Payer: Medicare HMO | Admitting: Family Medicine

## 2015-05-08 ENCOUNTER — Encounter: Payer: Self-pay | Admitting: Family Medicine

## 2015-05-08 VITALS — BP 124/80 | Temp 97.9°F | Ht 68.0 in | Wt 215.5 lb

## 2015-05-08 DIAGNOSIS — J209 Acute bronchitis, unspecified: Secondary | ICD-10-CM | POA: Diagnosis not present

## 2015-05-08 DIAGNOSIS — J019 Acute sinusitis, unspecified: Secondary | ICD-10-CM | POA: Diagnosis not present

## 2015-05-08 DIAGNOSIS — B9689 Other specified bacterial agents as the cause of diseases classified elsewhere: Secondary | ICD-10-CM

## 2015-05-08 MED ORDER — AZITHROMYCIN 250 MG PO TABS: ORAL_TABLET | ORAL | Status: DC

## 2015-05-08 NOTE — Progress Notes (Signed)
   Subjective:    Patient ID: Maria Esparza, female    DOB: 11-Apr-1945, 70 y.o.   MRN: [id number]  Cough This is a new problem. The current episode started in the past 7 days. The problem has been unchanged. Pertinent negatives include no chest pain, ear pain, fever, rhinorrhea, shortness of breath or wheezing. Associated symptoms comments: congestion. Nothing aggravates the symptoms. She has tried OTC cough suppressant for the symptoms. The treatment provided no relief.   Patient with some head congestion and drainage   Review of Systems  Constitutional: Negative for fever and activity change.  HENT: Positive for congestion. Negative for ear pain and rhinorrhea.   Eyes: Negative for discharge.  Respiratory: Positive for cough. Negative for shortness of breath and wheezing.   Cardiovascular: Negative for chest pain.       Objective:   Physical Exam  Constitutional: She appears well-developed.  HENT:  Head: Normocephalic.  Nose: Nose normal.  Mouth/Throat: Oropharynx is clear and moist. No oropharyngeal exudate.  Neck: Neck supple.  Cardiovascular: Normal rate and normal heart sounds.   No murmur heard. Pulmonary/Chest: Effort normal and breath sounds normal. She has no wheezes.  Bronchial breath/noises with coughing  Lymphadenopathy:    She has no cervical adenopathy.  Skin: Skin is warm and dry.  Nursing note and vitals reviewed.         Assessment & Plan:  Significant viral illness with secondary bacterial infection of sinuses and bronchioles. Should gradually get better with medications. Antibiotics prescribed warning signs discussed follow-up if problems

## 2015-05-10 ENCOUNTER — Encounter: Payer: Self-pay | Admitting: Nurse Practitioner

## 2015-05-10 ENCOUNTER — Ambulatory Visit (INDEPENDENT_AMBULATORY_CARE_PROVIDER_SITE_OTHER): Payer: Medicare HMO | Admitting: Nurse Practitioner

## 2015-05-10 VITALS — Ht 68.0 in | Wt 215.0 lb

## 2015-05-10 DIAGNOSIS — E119 Type 2 diabetes mellitus without complications: Secondary | ICD-10-CM | POA: Diagnosis not present

## 2015-05-10 NOTE — Patient Instructions (Signed)
Increase Glipizide 5 mg to 2 pills (total 10 mg) twice a day Check sugars twice a day over the weekend and call back results Monday

## 2015-05-10 NOTE — Progress Notes (Signed)
Subjective:  Presents for complaints of increased blood sugar. Has a recording with her today from 3/1-3/10. Numbers have ranged from 296-373. Began treatment for bronchitis on 3/8, began having symptoms around 3/1. Overall the symptoms have improved. Has had increased thirst and urination but states this is normal for her. Minimal headache. Generalized fatigue. Has an appointment with cardiology in a few days for workup see previous notes. No fevers. No abdominal pain. No changes in her diet. No changes in her stress level.  Objective:   Ht 5\' 8"  (1.727 m)  Wt 215 lb (97.523 kg)  BMI 32.70 kg/m2 NAD. Alert, oriented. Lungs clear. Heart regular rate rhythm. Occasional congested cough noted. Abdomen soft nondistended nontender. Random blood sugar 309.   Assessment:  Problem List Items Addressed This Visit      Endocrine   Diabetes ([place]) - Primary     Plan: Increase Glipizide 5 mg to 2 pills (total 10 mg) twice a day Check sugars twice a day over the weekend and call back results Monday Question whether the recent increase in blood sugars is related to her current illness. Further intervention based on blood sugar results this weekend.

## 2015-05-13 ENCOUNTER — Other Ambulatory Visit: Payer: Self-pay | Admitting: Nurse Practitioner

## 2015-05-13 ENCOUNTER — Telehealth: Payer: Self-pay | Admitting: Family Medicine

## 2015-05-13 MED ORDER — METFORMIN HCL 500 MG PO TABS: ORAL_TABLET | ORAL | Status: DC

## 2015-05-13 NOTE — Telephone Encounter (Signed)
Sugars are improving and hopefully will continue to improve; will send in Rx now for Metformin; start one with supper for at least 2 weeks then one twice a day with meals if tolerated. Most common side effects are gas, bloating and diarrhea. If any major problems, stop med and let us know.

## 2015-05-13 NOTE — Telephone Encounter (Signed)
Pt seen by Hoyle Sauer 05/10/15 was told to call back today to give Maria Esparza her sugar Readings for the time frame from that appt due to change in med        AM       PM   3/10  343 3/11 256 3/11  257 3/12 259 3/12  223 3/13 234

## 2015-05-13 NOTE — Telephone Encounter (Signed)
Discussed with pt. Pt verbalized understanding.  °

## 2015-05-14 ENCOUNTER — Other Ambulatory Visit: Payer: Self-pay | Admitting: Family Medicine

## 2015-05-15 ENCOUNTER — Encounter: Payer: Self-pay | Admitting: Cardiology

## 2015-05-15 ENCOUNTER — Ambulatory Visit (INDEPENDENT_AMBULATORY_CARE_PROVIDER_SITE_OTHER): Payer: Medicare HMO | Admitting: Cardiology

## 2015-05-15 VITALS — BP 141/80 | HR 62 | Ht 68.0 in | Wt 217.4 lb

## 2015-05-15 DIAGNOSIS — R55 Syncope and collapse: Secondary | ICD-10-CM | POA: Diagnosis not present

## 2015-05-15 NOTE — Progress Notes (Signed)
Patient ID: Lorrinda Mazer [facility], female   DOB: January 08, 1946, 70 y.o.   MRN: [id number]     Clinical Summary Ms. Aybar is a 70 y.o.female seen today as a new patient, she is referred by Dr Sallee Lange  1. Syncope - episode occurred about 3 weeks ago. Occurred after walking back from mailbox, sat down with magazine to read. Felt dizzy, mild headache. No palpitations, no chest pain. Fell back into chair. +LOC few seconds. Family reported body jerking. No bowel or bladder or incontience.  - can have some orthostatic dizziness. Most recently this AM after standing.  - reports blood sugars at homes have been elevated over the last 2 months.  - water bottles x 2-3, glass of mild at lunch. 1 pot of coffee throughout the day, no tea, no sodas, no energy drinks, no EtoH - DOE at about 200 feet which is stable.     Past Medical History  Diagnosis Date  . Hypercholesteremia   . Hypertension   . Diabetes mellitus   . Headache(784.0)   . Diabetes mellitus type 2, uncomplicated (HCC)     x 10 yrs  . Hyperlipidemia   . Diabetic neuropathy ([place]) rt foot  . IBS (irritable bowel syndrome)   . Asthma     mild  . Hypertriglyceridemia 2002  . Cough   . Anemia      Allergies  Allergen Reactions  . Penicillins Other (See Comments)    Vomiting Can take cephal   . Prinivil [Lisinopril] Cough     Current Outpatient Prescriptions  Medication Sig Dispense Refill  . albuterol (PROVENTIL HFA;VENTOLIN HFA) 108 (90 BASE) MCG/ACT inhaler Inhale 2 puffs into the lungs every 6 (six) hours as needed for wheezing or shortness of breath. 1 Inhaler 6  . aspirin EC 81 MG tablet Take 81 mg by mouth daily.      Marland Kitchen azithromycin (ZITHROMAX Z-PAK) 250 MG tablet Take 2 tablets (500 mg) on  Day 1,  followed by 1 tablet (250 mg) once daily on Days 2 through 5. 6 each 0  . calcium carbonate (OS-CAL) 600 MG TABS Take 600 mg by mouth 2 (two) times daily before a meal. With Vitamin D     . fish oil-omega-3 fatty  acids 1000 MG capsule Take 1 g by mouth daily.      Marland Kitchen glipiZIDE (GLUCOTROL) 5 MG tablet TAKE 1 TABLET BY MOUTH TWICE DAILY BEFORE A MEAL 180 tablet 1  . losartan (COZAAR) 50 MG tablet TAKE 1 TABLET(50 MG) BY MOUTH DAILY 90 tablet 1  . metFORMIN (GLUCOPHAGE) 500 MG tablet Start with one with supper for 2 weeks, then if tolerated one po BID with meals 60 tablet 2  . pravastatin (PRAVACHOL) 20 MG tablet Take 1 tablet (20 mg total) by mouth daily. 90 tablet 1   No current facility-administered medications for this visit.     Past Surgical History  Procedure Laterality Date  . Colon surgery  1994    for unknown reason.  They thought she had a mass.  . Colonoscopy  12/04/2010    Procedure: COLONOSCOPY;  Surgeon: Rogene Houston, MD;  [facility];  Service: Endoscopy;  Laterality: N/A;  3:00  . Appendectomy    . Breast surgery  1999    left breast biopsy  . Cervical spine surgery  december 2003  . Colonoscopy N/A 01/18/2015    Procedure: COLONOSCOPY;  Surgeon: Rogene Houston, MD;  [facility]: [facility];  Service: Endoscopy;  Laterality: N/A;  955 - moved to 10:40 - Ann notified pt     Allergies  Allergen Reactions  . Penicillins Other (See Comments)    Vomiting Can take cephal   . Prinivil [Lisinopril] Cough      Family History  Problem Relation Age of Onset  . Adopted: Yes     Social History Ms. Klaas reports that she has quit smoking. She does not have any smokeless tobacco history on file. Ms. Stetzel reports that she drinks alcohol.   Review of Systems CONSTITUTIONAL: No weight loss, fever, chills, weakness or fatigue.  HEENT: Eyes: No visual loss, blurred vision, double vision or yellow sclerae.No hearing loss, sneezing, congestion, runny nose or sore throat.  SKIN: No rash or itching.  CARDIOVASCULAR: no chest pain, no palpitations RESPIRATORY: No shortness of breath, cough or sputum.  GASTROINTESTINAL: No anorexia, nausea, vomiting or diarrhea.  No abdominal pain or blood.  GENITOURINARY: No burning on urination, no polyuria NEUROLOGICAL:+dizziness MUSCULOSKELETAL: No muscle, back pain, joint pain or stiffness.  LYMPHATICS: No enlarged nodes. No history of splenectomy.  PSYCHIATRIC: No history of depression or anxiety.  ENDOCRINOLOGIC: No reports of sweating, cold or heat intolerance. No polyuria or polydipsia.  Marland Kitchen   Physical Examination Filed Vitals:   05/15/15 0915  BP: 141/80  Pulse: 62   Filed Vitals:   05/15/15 0915  Height: 5\' 8"  (1.727 m)  Weight: 217 lb 6.4 oz (98.612 kg)    Gen: resting comfortably, no acute distress HEENT: no scleral icterus, pupils equal round and reactive, no palptable cervical adenopathy,  CV: RRR, no m/r/g, no jvd Resp: Clear to auscultation bilaterally GI: abdomen is soft, non-tender, non-distended, normal bowel sounds, no hepatosplenomegaly MSK: extremities are warm, no edema.  Skin: warm, no rash Neuro:  no focal deficits Psych: appropriate affect      Assessment and Plan  1. Syncope - unclear etiology. - EKG reviewed and shows SR with nonspecific conduction delayed - orthostatics in clinic are mixed. BP increases with sitting and decreases with standing, nonspecific pattern. Overall poor oral intake, have encouraged increased oral hydration - will obtain echo and 7 day event monitor   F/u 1 month    [name], M.D.

## 2015-05-15 NOTE — Patient Instructions (Signed)
Your physician wants you to follow-up in 4-6 [place]. [place]  Your physician recommends that you continue on your current medications as directed. Please refer to the Current Medication list given to you today.  Your physician has requested that you have an echocardiogram. Echocardiography is a painless test that uses sound waves to create images of your heart. It provides your doctor with information about the size and shape of your heart and how well your heart's chambers and valves are working. This procedure takes approximately one hour. There are no restrictions for this procedure.  Your physician has recommended that you wear an event monitor FOR 7 DAYS . Event monitors are medical devices that record the heart's electrical activity. Doctors most often [place] these monitors to diagnose arrhythmias. Arrhythmias are problems with the speed or rhythm of the heartbeat. The monitor is a small, portable device. You can wear one while you do your normal daily activities. This is usually used to diagnose what is causing palpitations/syncope (passing out).  Thank you for choosing [facility]!!

## 2015-05-17 ENCOUNTER — Ambulatory Visit: Payer: Medicare HMO

## 2015-05-17 DIAGNOSIS — R55 Syncope and collapse: Secondary | ICD-10-CM | POA: Diagnosis not present

## 2015-05-21 ENCOUNTER — Ambulatory Visit (HOSPITAL_COMMUNITY)
Admission: RE | Admit: 2015-05-21 | Discharge: 2015-05-21 | Disposition: A | Discharge status: Home / Self Care | Payer: Medicare HMO | Source: Ambulatory Visit | Attending: Cardiology | Admitting: Cardiology

## 2015-05-21 DIAGNOSIS — R55 Syncope and collapse: Secondary | ICD-10-CM | POA: Insufficient documentation

## 2015-05-21 DIAGNOSIS — I10 Essential (primary) hypertension: Secondary | ICD-10-CM | POA: Diagnosis not present

## 2015-05-21 DIAGNOSIS — E785 Hyperlipidemia, unspecified: Secondary | ICD-10-CM | POA: Diagnosis not present

## 2015-05-21 DIAGNOSIS — E119 Type 2 diabetes mellitus without complications: Secondary | ICD-10-CM | POA: Insufficient documentation

## 2015-05-27 ENCOUNTER — Telehealth: Payer: Self-pay | Admitting: Nurse Practitioner

## 2015-05-27 ENCOUNTER — Telehealth: Payer: Self-pay | Admitting: Family Medicine

## 2015-05-27 DIAGNOSIS — M25532 Pain in left wrist: Secondary | ICD-10-CM

## 2015-05-27 NOTE — Telephone Encounter (Signed)
Give sugar two more wks, ortho surg ref for cyst

## 2015-05-27 NOTE — Telephone Encounter (Signed)
05/10/15 Glipizide was increased to Glipizide 5 mg 2 tabs twice a day and then metformin 500 was added one tablet twice a day on 05/13/15

## 2015-05-27 NOTE — Telephone Encounter (Signed)
Pt calling back as directed that if her left  wrist cyst does not get any better  To call back and get a referral to have it evaluated with a specialist

## 2015-05-27 NOTE — Telephone Encounter (Signed)
Pt states she is calling back to let you know her sugar levels are in the 200's currently  And wants to know if you thought that was ok or does she need to adjust again?  219 - 250 readings   Please advise

## 2015-05-27 NOTE — Telephone Encounter (Signed)
Referral ordered in EPIC. Patient notified to give sugar a few more weeks-continue same meds and call with readings in 2 weeks. Patient verbalized understanding.

## 2015-05-29 ENCOUNTER — Encounter: Payer: Self-pay | Admitting: Family Medicine

## 2015-06-06 ENCOUNTER — Ambulatory Visit: Payer: Medicare HMO | Admitting: Orthopaedic Surgery

## 2015-06-11 ENCOUNTER — Telehealth: Payer: Self-pay | Admitting: *Deleted

## 2015-06-11 NOTE — Telephone Encounter (Signed)
Pt aware, EOS scanned into chart, pt confirmed appt 4/12 in [place]

## 2015-06-11 NOTE — Telephone Encounter (Signed)
-----   Message from [name], MD sent at 06/07/2015  1:51 PM EDT ----- Can we rescan this patient's monitor. While it appears as scanned into epic it never entered the list to generate a report on it. Let her know that no significant arrhythmias were noted. We will discus at f/u  Maria Abts MD

## 2015-06-12 ENCOUNTER — Ambulatory Visit (INDEPENDENT_AMBULATORY_CARE_PROVIDER_SITE_OTHER): Payer: Medicare HMO | Admitting: Cardiology

## 2015-06-12 ENCOUNTER — Ambulatory Visit: Payer: Medicare HMO | Admitting: Cardiology

## 2015-06-12 ENCOUNTER — Encounter: Payer: Self-pay | Admitting: Cardiology

## 2015-06-12 VITALS — BP 136/80 | HR 81 | Ht 68.0 in | Wt 216.0 lb

## 2015-06-12 DIAGNOSIS — R55 Syncope and collapse: Secondary | ICD-10-CM

## 2015-06-12 NOTE — Patient Instructions (Signed)

## 2015-06-12 NOTE — Progress Notes (Signed)
Patient ID: Maria Esparza [facility], female   DOB: [birth year], 70 y.o.   MRN: [id number]     Clinical Summary Maria Esparza is a 70 y.o.female seen today for follow up of the following medical problems.   1. Syncope - last visit she described an episode of syncopeOccurred after walking back from mailbox, sat down with magazine to read. Felt dizzy, mild headache. No palpitations, no chest pain. Fell back into chair. +LOC few seconds. Family reported body jerking. No bowel or bladder or incontience.  - can have some orthostatic dizziness. Most recently this AM after standing.  - water bottles x 2-3, glass of milk at lunch. 1 pot of coffee throughout the day, no tea, no sodas, no energy drinks, no EtoH - DOE at about 200 feet which is stable.   - since last visit she completed an echo and event monitor that were both benign.  - no recurrent symptoms since last visit. Orthostatic symptoms improved since cutting down on caffeine   Past Medical History  Diagnosis Date  . Hypercholesteremia   . Hypertension   . Diabetes mellitus   . Headache(784.0)   . Diabetes mellitus type 2, uncomplicated (HCC)     x 10 yrs  . Hyperlipidemia   . Diabetic neuropathy ([place]) rt foot  . IBS (irritable bowel syndrome)   . Asthma     mild  . Hypertriglyceridemia 2002  . Cough   . Anemia      Allergies  Allergen Reactions  . Penicillins Other (See Comments)    Vomiting Can take cephal   . Prinivil [Lisinopril] Cough     Current Outpatient Prescriptions  Medication Sig Dispense Refill  . aspirin EC 81 MG tablet Take 81 mg by mouth daily.      . calcium carbonate (OS-CAL) 600 MG TABS Take 600 mg by mouth 2 (two) times daily before a meal. With Vitamin D     . fish oil-omega-3 fatty acids 1000 MG capsule Take 1 g by mouth daily.      Marland Kitchen glipiZIDE (GLUCOTROL) 5 MG tablet Take 10 mg by mouth 2 (two) times daily.    Marland Kitchen losartan (COZAAR) 50 MG tablet TAKE 1 TABLET(50 MG) BY MOUTH DAILY 90 tablet 1  .  metFORMIN (GLUCOPHAGE) 500 MG tablet Start with one with supper for 2 weeks, then if tolerated one po BID with meals 60 tablet 2  . pravastatin (PRAVACHOL) 20 MG tablet Take 1 tablet (20 mg total) by mouth daily. 90 tablet 1   No current facility-administered medications for this visit.     Past Surgical History  Procedure Laterality Date  . Colon surgery  1994    for unknown reason.  They thought she had a mass.  . Colonoscopy  12/04/2010    Procedure: COLONOSCOPY;  Surgeon: Rogene Houston, MD;  [facility];  Service: Endoscopy;  Laterality: N/A;  3:00  . Appendectomy    . Breast surgery  1999    left breast biopsy  . Cervical spine surgery  december 2003  . Colonoscopy N/A 01/18/2015    Procedure: COLONOSCOPY;  Surgeon: Rogene Houston, MD;  [facility];  Service: Endoscopy;  Laterality: N/A;  955 - moved to 10:40 - Ann notified pt     Allergies  Allergen Reactions  . Penicillins Other (See Comments)    Vomiting Can take cephal   . Prinivil [Lisinopril] Cough      Family History  Problem Relation Age of Onset  .  Adopted: Yes     Social History Maria Esparza reports that she has quit smoking. She does not have any smokeless tobacco history on file. Maria Esparza reports that she drinks alcohol.   Review of Systems CONSTITUTIONAL: No weight loss, fever, chills, weakness or fatigue.  HEENT: Eyes: No visual loss, blurred vision, double vision or yellow sclerae.No hearing loss, sneezing, congestion, runny nose or sore throat.  SKIN: No rash or itching.  CARDIOVASCULAR: no chest pain, no palpitations.  RESPIRATORY: No shortness of breath, cough or sputum.  GASTROINTESTINAL: No anorexia, nausea, vomiting or diarrhea. No abdominal pain or blood.  GENITOURINARY: No burning on urination, no polyuria NEUROLOGICAL: No headache, dizziness, syncope, paralysis, ataxia, numbness or tingling in the extremities. No change in bowel or bladder control.    MUSCULOSKELETAL: No muscle, back pain, joint pain or stiffness.  LYMPHATICS: No enlarged nodes. No history of splenectomy.  PSYCHIATRIC: No history of depression or anxiety.  ENDOCRINOLOGIC: No reports of sweating, cold or heat intolerance. No polyuria or polydipsia.  Marland Kitchen   Physical Examination Filed Vitals:   06/12/15 1253  BP: 136/80  Pulse: 81   Filed Vitals:   06/12/15 1253  Height: 5\' 8"  (1.727 m)  Weight: 216 lb (97.977 kg)    Gen: resting comfortably, no acute distress HEENT: no scleral icterus, pupils equal round and reactive, no palptable cervical adenopathy,  CV: RRR, no m/r/g, no jvd Resp: Clear to auscultation bilaterally GI: abdomen is soft, non-tender, non-distended, normal bowel sounds, no hepatosplenomegaly MSK: extremities are warm, no edema.  Skin: warm, no rash Neuro:  no focal deficits Psych: appropriate affect   Diagnostic Studies 05/2015 echo Study Conclusions  - Left ventricle: The cavity size was normal. Wall thickness was  increased in a pattern of mild LVH. Systolic function was normal.  The estimated ejection fraction was in the range of 55% to 60%.  Doppler parameters are consistent with abnormal left ventricular  relaxation (grade 1 diastolic dysfunction). - Regional wall motion abnormality: Hypokinesis of the basal-mid  anteroseptal myocardium. - Aortic valve: Mildly calcified annulus. Trileaflet; mildly  thickened leaflets. There was mild regurgitation. Valve area  (VTI): 2.04 cm^2. Valve area (Vmax): 2.11 cm^2. Valve area  (Vmean): 2.12 cm^2. Regurgitation pressure half-time: 697 ms. - Mitral valve: Mildly calcified annulus. Mildly thickened leaflets  . - Technically adequate study.  05/2015 Event monitor - no arrhythmias  Assessment and Plan  1. Syncope - orthostatics were mixed at last visit, with increase in bp with sitting up but decrease with sitting. Orthostatics today show 18 point drop in SBP with standing.  -  event monitor and echo unremarkable. -no recurrent episodes, previous orthostatic symptoms improved with increased oral intake - probable orthostatic syncope, no further cardiac workup at this time. Continue to encourage increased oral hydration and less caffeine intake. If recurrent symptoms consider compression stockings and/or florinef, could have some component of autonomic insufficiency given her DM2 history.   F/u 6 months     [name], M.D.

## 2015-06-17 ENCOUNTER — Telehealth: Payer: Self-pay | Admitting: Family Medicine

## 2015-06-17 NOTE — Telephone Encounter (Signed)
Patient wants to know if she is supposed to fast when she has her blood work done?

## 2015-06-17 NOTE — Telephone Encounter (Signed)
Discussed with pt that she does need to fast since lipid was ordered. Pt verbalized understanding.

## 2015-06-20 ENCOUNTER — Encounter: Payer: Self-pay | Admitting: Orthopaedic Surgery

## 2015-06-20 ENCOUNTER — Ambulatory Visit (INDEPENDENT_AMBULATORY_CARE_PROVIDER_SITE_OTHER): Payer: Medicare HMO | Admitting: Orthopaedic Surgery

## 2015-06-20 VITALS — BP 176/74 | HR 64 | Temp 97.5°F | Ht 68.0 in | Wt 216.0 lb

## 2015-06-20 DIAGNOSIS — E1149 Type 2 diabetes mellitus with other diabetic neurological complication: Secondary | ICD-10-CM

## 2015-06-20 DIAGNOSIS — Z79899 Other long term (current) drug therapy: Secondary | ICD-10-CM | POA: Diagnosis not present

## 2015-06-20 DIAGNOSIS — M67432 Ganglion, left wrist: Secondary | ICD-10-CM

## 2015-06-20 DIAGNOSIS — E785 Hyperlipidemia, unspecified: Secondary | ICD-10-CM | POA: Diagnosis not present

## 2015-06-20 DIAGNOSIS — E119 Type 2 diabetes mellitus without complications: Secondary | ICD-10-CM | POA: Diagnosis not present

## 2015-06-20 MED ORDER — NAPROXEN 500 MG PO TABS: 500.0000 mg | ORAL_TABLET | Freq: Two times a day (BID) | ORAL | Status: DC

## 2015-06-20 NOTE — Progress Notes (Signed)
Subjective: I have a cyst on my left wrist    Patient ID: Maria Esparza, female    DOB: September 06, 1945, 70 y.o.   MRN: [id number]  Wrist Pain  The pain is present in the left wrist. This is a chronic problem. The current episode started more than 1 month ago. There has been no history of extremity trauma. The problem occurs daily. The problem has been gradually worsening. The quality of the pain is described as aching. The pain is at a severity of 3/10. The pain is mild. The symptoms are aggravated by activity. She has tried OTC pain meds for the symptoms. The treatment provided mild relief. Her past medical history is significant for diabetes and osteoarthritis.   She has had a cyst on the dorsum of the left wrist for several months.  She had x-rays done 12/20/14 of the left hand which was negative.  I have reviewed the x-rays and the report.  She said the cyst has not bothered her until about three weeks ago.  It is larger.  She has some pain of the fingers in the morning. She has no redness, no trauma, no numbness.   She is taking 2 Bayer aspirin three times a day with little help.  She has diabetes.  Review of Systems  HENT: Negative for congestion.   Respiratory: Negative for cough and shortness of breath.   Cardiovascular: Negative for chest pain and leg swelling.  Endocrine: Positive for cold intolerance.  Musculoskeletal: Positive for arthralgias.  Allergic/Immunologic: Positive for environmental allergies.   Past Medical History  Diagnosis Date  . Hypercholesteremia   . Hypertension   . Diabetes mellitus   . Headache(784.0)   . Diabetes mellitus type 2, uncomplicated (HCC)     x 10 yrs  . Hyperlipidemia   . Diabetic neuropathy ([place]) rt foot  . IBS (irritable bowel syndrome)   . Asthma     mild  . Hypertriglyceridemia 2002  . Cough   . Anemia     Past Surgical History  Procedure Laterality Date  . Colon surgery  1994    for unknown reason.  They thought she had a  mass.  . Colonoscopy  12/04/2010    Procedure: COLONOSCOPY;  Surgeon: Rogene Houston, MD;  [facility];  Service: Endoscopy;  Laterality: N/A;  3:00  . Appendectomy    . Breast surgery  1999    left breast biopsy  . Cervical spine surgery  december 2003  . Colonoscopy N/A 01/18/2015    Procedure: COLONOSCOPY;  Surgeon: Rogene Houston, MD;  [facility];  Service: Endoscopy;  Laterality: N/A;  955 - moved to 10:40 - Ann notified pt    Current Outpatient Prescriptions on File Prior to Visit  Medication Sig Dispense Refill  . aspirin EC 81 MG tablet Take 81 mg by mouth daily.      . calcium carbonate (OS-CAL) 600 MG TABS Take 600 mg by mouth 2 (two) times daily before a meal. With Vitamin D     . fish oil-omega-3 fatty acids 1000 MG capsule Take 1 g by mouth daily.      Marland Kitchen glipiZIDE (GLUCOTROL) 5 MG tablet Take 10 mg by mouth 2 (two) times daily.    Marland Kitchen losartan (COZAAR) 50 MG tablet TAKE 1 TABLET(50 MG) BY MOUTH DAILY 90 tablet 1  . metFORMIN (GLUCOPHAGE) 500 MG tablet Start with one with supper for 2 weeks, then if tolerated one po BID with  meals 60 tablet 2  . pravastatin (PRAVACHOL) 20 MG tablet Take 1 tablet (20 mg total) by mouth daily. 90 tablet 1   No current facility-administered medications on file prior to visit.    Social History   Social History  . Marital Status: Married    Spouse Name: N/A  . Number of Children: N/A  . Years of Education: N/A   Occupational History  . Not on file.   Social History Main Topics  . Smoking status: Former Smoker -- 3.00 packs/day for 20 years  . Smokeless tobacco: Not on file     Comment: Quit smoking in early 80s after smoking 20 yrs,.  . Alcohol Use: 0.0 oz/week    0 Standard drinks or equivalent per week     Comment: very rare occasion on a holiday  . Drug Use: No  . Sexual Activity: Not on file   Other Topics Concern  . Not on file   Social History Narrative    BP 176/74 mmHg  Pulse 64  Temp(Src)  97.5 F (36.4 C)  Ht 5\' 8"  (1.727 m)  Wt 216 lb (97.977 kg)  BMI 32.85 kg/m2     Objective:   Physical Exam  Constitutional: She is oriented to person, place, and time. She appears well-developed and well-nourished.  HENT:  Head: Normocephalic and atraumatic.  Eyes: Conjunctivae and EOM are normal. Pupils are equal, round, and reactive to light.  Neck: Normal range of motion. Neck supple.  Cardiovascular: Normal rate, regular rhythm and intact distal pulses.   Pulmonary/Chest: Effort normal.  Abdominal: Soft.  Musculoskeletal: She exhibits tenderness (left dorsal wrist with diffuse ganglion mid wrist, full ROM, normal sensation and pulses, no redness.).       Left hand: She exhibits tenderness.       Hands: Neurological: She is alert and oriented to person, place, and time. She displays normal reflexes. No cranial nerve deficit. She exhibits normal muscle tone. Coordination normal.  Skin: Skin is warm and dry.  Psychiatric: She has a normal mood and affect. Her behavior is normal. Judgment and thought content normal.   Right hand and wrist normal.  She is right hand dominant.     Assessment & Plan:   Encounter Diagnoses  Name Primary?  . Ganglion cyst of wrist, left Yes  . Other diabetic neurological complication associated with type 2 diabetes mellitus ([place])    I have discussed with her aspiration of the left ganglion cyst. I told her the swelling could return and she might need surgery for excision later.  She agrees.  PROCEDURE NOTE  I prepped the left dorsal wrist.  Ethyl chloride was used.  A 16 gauge needle was used and I aspirated about 3 1/2 cc of clear gelatinous fluid from the cyst area.  She tolerated it well.  A band-aid dressing applied.  A cock-up splint is given.  I will give Naprosyn bid pc.  She is not to use aspirin while on the Naprosyn.  Return in two weeks.  Call if any problem.

## 2015-06-21 LAB — BASIC METABOLIC PANEL
BUN / CREAT RATIO: 12 (ref 12–28)
BUN: 10 mg/dL (ref 8–27)
CO2: 25 mmol/L (ref 18–29)
CREATININE: 0.83 mg/dL (ref 0.57–1.00)
Calcium: 9 mg/dL (ref 8.7–10.3)
Chloride: 95 mmol/L — ABNORMAL LOW (ref 96–106)
GFR calc Af Amer: 83 mL/min/{1.73_m2} (ref >59)
GFR calc non Af Amer: 72 mL/min/{1.73_m2} (ref >59)
GLUCOSE: 197 mg/dL — AB (ref 65–99)
Potassium: 4.4 mmol/L (ref 3.5–5.2)
SODIUM: 134 mmol/L (ref 134–144)

## 2015-06-21 LAB — HEPATIC FUNCTION PANEL
ALK PHOS: 66 IU/L (ref 39–117)
ALT: 36 IU/L — AB (ref 0–32)
AST: 40 IU/L (ref 0–40)
Albumin: 4.2 g/dL (ref 3.5–4.8)
BILIRUBIN, DIRECT: 0.08 mg/dL (ref 0.00–0.40)
Bilirubin Total: 0.2 mg/dL (ref 0.0–1.2)
TOTAL PROTEIN: 6.7 g/dL (ref 6.0–8.5)

## 2015-06-21 LAB — LIPID PANEL
CHOLESTEROL TOTAL: 212 mg/dL — AB (ref 100–199)
Chol/HDL Ratio: 4.2 ratio units (ref 0.0–4.4)
HDL: 50 mg/dL (ref >39)
LDL Calculated: 96 mg/dL (ref 0–99)
TRIGLYCERIDES: 330 mg/dL — AB (ref 0–149)
VLDL Cholesterol Cal: 66 mg/dL — ABNORMAL HIGH (ref 5–40)

## 2015-06-21 LAB — MICROALBUMIN / CREATININE URINE RATIO
CREATININE, UR: 61.7 mg/dL
MICROALB/CREAT RATIO: 27.6 mg/g creat (ref 0.0–30.0)
Microalbumin, Urine: 17 ug/mL

## 2015-06-24 ENCOUNTER — Ambulatory Visit (INDEPENDENT_AMBULATORY_CARE_PROVIDER_SITE_OTHER): Payer: Medicare HMO | Admitting: Family Medicine

## 2015-06-24 ENCOUNTER — Encounter: Payer: Self-pay | Admitting: Family Medicine

## 2015-06-24 VITALS — BP 136/82 | Ht 68.0 in | Wt 220.4 lb

## 2015-06-24 DIAGNOSIS — E7800 Pure hypercholesterolemia, unspecified: Secondary | ICD-10-CM

## 2015-06-24 DIAGNOSIS — I10 Essential (primary) hypertension: Secondary | ICD-10-CM

## 2015-06-24 DIAGNOSIS — R350 Frequency of micturition: Secondary | ICD-10-CM

## 2015-06-24 DIAGNOSIS — E119 Type 2 diabetes mellitus without complications: Secondary | ICD-10-CM

## 2015-06-24 LAB — POCT URINALYSIS DIPSTICK
PH UA: 5
SPEC GRAV UA: 1.015

## 2015-06-24 LAB — POCT GLYCOSYLATED HEMOGLOBIN (HGB A1C): Hemoglobin A1C: 5.7

## 2015-06-24 MED ORDER — LOSARTAN POTASSIUM 50 MG PO TABS: ORAL_TABLET | ORAL | Status: DC

## 2015-06-24 MED ORDER — PRAVASTATIN SODIUM 20 MG PO TABS: 20.0000 mg | ORAL_TABLET | Freq: Every day | ORAL | Status: DC

## 2015-06-24 MED ORDER — CIPROFLOXACIN HCL 250 MG PO TABS: ORAL_TABLET | ORAL | Status: DC

## 2015-06-24 MED ORDER — METFORMIN HCL 500 MG PO TABS: ORAL_TABLET | ORAL | Status: DC

## 2015-06-24 NOTE — Progress Notes (Signed)
   Subjective:    Patient ID: Maria Esparza, female    DOB: 1945-05-17, 70 y.o.   MRN: [id number]  Diabetes She presents for her follow-up diabetic visit. She has type 2 diabetes mellitus. No MedicAlert identification noted. She sees a podiatrist.Eye exam is current (06/30/2014).   Patient has concerns of discoloration to right side of face, going on quite some time. Slightly scaly patch. No bleeding no pain   and urinary frequency. Worse past couple weeks. No obvious dysuria just substantial increase frequency  Last A1c: 04/26/15 Results for orders placed or performed in visit on 06/24/15  POCT urinalysis dipstick  Result Value Ref Range   Color, UA Yellow    Clarity, UA Cloudy    Glucose, UA     Bilirubin, UA     Ketones, UA     Spec Grav, UA 1.015    Blood, UA Trace    pH, UA 5.0    Protein, UA     Urobilinogen, UA     Nitrite, UA     Leukocytes, UA large (3+) (A) Negative     Results for orders placed or performed in visit on 06/24/15  POCT urinalysis dipstick  Result Value Ref Range   Color, UA Yellow    Clarity, UA Cloudy    Glucose, UA     Bilirubin, UA     Ketones, UA     Spec Grav, UA 1.015    Blood, UA Trace    pH, UA 5.0    Protein, UA     Urobilinogen, UA     Nitrite, UA     Leukocytes, UA large (3+) (A) Negative   Compliant with blood pressure medication. No obvious side effects watching diet Review of Systems No headache, no major weight loss or weight gain, no chest pain no back pain abdominal pain no change in bowel habits complete ROS otherwise negative     Objective:   Physical Exam Alert no acute distress blood pressure improved on repeat HEENT normal right face seborrheic keratosis noted lungs clear. Heart regular in rhythm ankles trace edema feet not examined this time due to see a podiatrist shortly       Assessment & Plan:  Impression 1 hypertension good control discussed maintain same meds #2 type 2 diabetes though fasting sugars  often still up A1c excellent at 5.7 discussed maintain same meds. No obvious low sugar spells #3 hyperlipidemia discussed, LDL now well controlled to continue same medications #4 seborrheic keratosis face reassured cosmetic only plan as noted above medication refill diet exercise discussed recheck in 6 months WSL #5 urinary tract infection antibiotics prescribed

## 2015-06-26 ENCOUNTER — Other Ambulatory Visit: Payer: Self-pay

## 2015-06-26 LAB — URINE CULTURE

## 2015-06-26 MED ORDER — CEPHALEXIN 500 MG PO CAPS: 500.0000 mg | ORAL_CAPSULE | Freq: Three times a day (TID) | ORAL | Status: DC

## 2015-06-28 DIAGNOSIS — L851 Acquired keratosis [keratoderma] palmaris et plantaris: Secondary | ICD-10-CM | POA: Diagnosis not present

## 2015-06-28 DIAGNOSIS — B351 Tinea unguium: Secondary | ICD-10-CM | POA: Diagnosis not present

## 2015-06-28 DIAGNOSIS — E1142 Type 2 diabetes mellitus with diabetic polyneuropathy: Secondary | ICD-10-CM | POA: Diagnosis not present

## 2015-07-04 ENCOUNTER — Ambulatory Visit (INDEPENDENT_AMBULATORY_CARE_PROVIDER_SITE_OTHER): Payer: Medicare HMO | Admitting: Orthopaedic Surgery

## 2015-07-04 ENCOUNTER — Encounter: Payer: Self-pay | Admitting: Orthopaedic Surgery

## 2015-07-04 VITALS — BP 162/87 | HR 77 | Temp 97.2°F | Ht 68.0 in | Wt 219.6 lb

## 2015-07-04 DIAGNOSIS — E1149 Type 2 diabetes mellitus with other diabetic neurological complication: Secondary | ICD-10-CM

## 2015-07-04 DIAGNOSIS — M67432 Ganglion, left wrist: Secondary | ICD-10-CM | POA: Diagnosis not present

## 2015-07-04 NOTE — Progress Notes (Signed)
Patient Maria Esparza P Bucholz, female DOB:21-Aug-1945, 70 y.o. [id number]  Chief Complaint  Patient presents with  . Follow-up    Left wrist    HPI  Maria Esparza is a 70 y.o. female who has a ganglion cyst of the left dorsal wrist that I aspirated last visit. She has been using her cock-up splint.  She has continued her regular activities.  The cyst is present but much smaller than before aspiration.  It is a little tender at times.  She has some days when the cyst is larger but it is varying in size now which it did not do before.  I have told her that if the cyst gets bigger and bothers her, then she can have surgery.  It is an elective outpatient procedure.  She can call and make appointment to see Dr. Aline Brochure.  Otherwise, just watch it.  She can use the splint as needed.  HPI  Body mass index is 33.4 kg/(m^2).   Review of Systems  HENT: Negative for congestion.   Respiratory: Negative for cough and shortness of breath.   Cardiovascular: Negative for chest pain and leg swelling.  Endocrine: Positive for cold intolerance.  Musculoskeletal: Positive for arthralgias.  Allergic/Immunologic: Positive for environmental allergies.    Past Medical History  Diagnosis Date  . Hypercholesteremia   . Hypertension   . Diabetes mellitus   . Headache(784.0)   . Diabetes mellitus type 2, uncomplicated (HCC)     x 10 yrs  . Hyperlipidemia   . Diabetic neuropathy ([place]) rt foot  . IBS (irritable bowel syndrome)   . Asthma     mild  . Hypertriglyceridemia 2002  . Cough   . Anemia     Past Surgical History  Procedure Laterality Date  . Colon surgery  1994    for unknown reason.  They thought she had a mass.  . Colonoscopy  12/04/2010    Procedure: COLONOSCOPY;  Surgeon: Rogene Houston, MD;  Location: [facility];  Service: Endoscopy;  Laterality: N/A;  3:00  . Appendectomy    . Breast surgery  1999    left breast biopsy  . Cervical spine surgery  december 2003  .  Colonoscopy N/A 01/18/2015    Procedure: COLONOSCOPY;  Surgeon: Rogene Houston, MD;  Location: [facility];  Service: Endoscopy;  Laterality: N/A;  955 - moved to 10:40 - Ann notified pt    Family History  Problem Relation Age of Onset  . Adopted: Yes    Social History Social History  Substance Use Topics  . Smoking status: Former Smoker -- 3.00 packs/day for 20 years  . Smokeless tobacco: None     Comment: Quit smoking in early 80s after smoking 20 yrs,.  . Alcohol Use: 0.0 oz/week    0 Standard drinks or equivalent per week     Comment: very rare occasion on a holiday    Allergies  Allergen Reactions  . Penicillins Other (See Comments)    Vomiting Can take cephal   . Prinivil [Lisinopril] Cough    Current Outpatient Prescriptions  Medication Sig Dispense Refill  . aspirin EC 81 MG tablet Take 81 mg by mouth daily.      . calcium carbonate (OS-CAL) 600 MG TABS Take 600 mg by mouth 2 (two) times daily before a meal. With Vitamin D     . cephALEXin (KEFLEX) 500 MG capsule Take 1 capsule (500 mg total) by mouth 3 (three) times daily. X 7 days  21 capsule 0  . fish oil-omega-3 fatty acids 1000 MG capsule Take 1 g by mouth daily.      Marland Kitchen glipiZIDE (GLUCOTROL) 5 MG tablet Take 10 mg by mouth 2 (two) times daily.    Marland Kitchen losartan (COZAAR) 50 MG tablet TAKE 1 TABLET(50 MG) BY MOUTH DAILY 90 tablet 1  . metFORMIN (GLUCOPHAGE) 500 MG tablet Start with one with supper for 2 weeks, then if tolerated one po BID with meals 60 tablet 2  . naproxen (NAPROSYN) 500 MG tablet Take 1 tablet (500 mg total) by mouth 2 (two) times daily with a meal. 60 tablet 5  . pravastatin (PRAVACHOL) 20 MG tablet Take 1 tablet (20 mg total) by mouth daily. 90 tablet 1   No current facility-administered medications for this visit.     Physical Exam  Blood pressure 162/87, pulse 77, temperature 97.2 F (36.2 C), height 5\' 8"  (1.727 m), weight 219 lb 9.6 oz (99.61 kg).  Constitutional: overall normal  hygiene, normal nutrition, well developed, normal grooming, normal body habitus. Assistive device:cock up splint left  Musculoskeletal: gait and station Limp none, muscle tone and strength are normal, no tremors or atrophy is present.  .  Neurological: coordination overall normal.  Deep tendon reflex/nerve stretch intact.  Sensation normal.  Cranial nerves II-XII intact.   Skin:   normal overall no scars, lesions, ulcers or rashes. No psoriasis.  Psychiatric: Alert and oriented x 3.  Recent memory intact, remote memory unclear.  Normal mood and affect. Well groomed.  Good eye contact.  Cardiovascular: overall no swelling, no varicosities, no edema bilaterally, normal temperatures of the legs and arms, no clubbing, cyanosis and good capillary refill.  Lymphatic: palpation is normal.   Extremities:she has a very small ganglion of the left mid dorsal wrist, about size of two grape seeds.  It is not red.  NV is intact.  Right wrist (dominant) normal. Inspection small left dorsal ganglion cyst Strength and tone normal Range of motion full both wrists and hands  The patient has been educated about the nature of the problem(s) and counseled on treatment options.  The patient appeared to understand what I have discussed and is in agreement with it.  Encounter Diagnoses  Name Primary?  . Ganglion cyst of wrist, left Yes  . Other diabetic neurological complication associated with type 2 diabetes mellitus ([place])     PLAN Call if any problems.  Precautions discussed.  Continue current medications.   Return to clinic as needed  Call if cyst gets worse.  Schedule to see Dr. Aline Brochure if she wants surgery.

## 2015-07-05 DIAGNOSIS — R69 Illness, unspecified: Secondary | ICD-10-CM | POA: Diagnosis not present

## 2015-07-11 ENCOUNTER — Other Ambulatory Visit: Payer: Self-pay

## 2015-07-11 ENCOUNTER — Other Ambulatory Visit: Payer: Self-pay | Admitting: Nurse Practitioner

## 2015-07-11 ENCOUNTER — Other Ambulatory Visit: Payer: Self-pay | Admitting: Family Medicine

## 2015-07-11 MED ORDER — GLIPIZIDE 5 MG PO TABS: 10.0000 mg | ORAL_TABLET | Freq: Two times a day (BID) | ORAL | Status: DC

## 2015-07-22 ENCOUNTER — Other Ambulatory Visit: Payer: Self-pay | Admitting: Family Medicine

## 2015-08-06 DIAGNOSIS — Z0100 Encounter for examination of eyes and vision without abnormal findings: Secondary | ICD-10-CM | POA: Diagnosis not present

## 2015-08-06 DIAGNOSIS — H524 Presbyopia: Secondary | ICD-10-CM | POA: Diagnosis not present

## 2015-08-06 DIAGNOSIS — H25013 Cortical age-related cataract, bilateral: Secondary | ICD-10-CM | POA: Diagnosis not present

## 2015-08-14 ENCOUNTER — Other Ambulatory Visit: Payer: Self-pay | Admitting: Nurse Practitioner

## 2015-08-20 DIAGNOSIS — M79671 Pain in right foot: Secondary | ICD-10-CM | POA: Diagnosis not present

## 2015-08-20 DIAGNOSIS — E1142 Type 2 diabetes mellitus with diabetic polyneuropathy: Secondary | ICD-10-CM | POA: Diagnosis not present

## 2015-09-04 DIAGNOSIS — E1142 Type 2 diabetes mellitus with diabetic polyneuropathy: Secondary | ICD-10-CM | POA: Diagnosis not present

## 2015-09-04 DIAGNOSIS — I739 Peripheral vascular disease, unspecified: Secondary | ICD-10-CM | POA: Diagnosis not present

## 2015-09-06 DIAGNOSIS — L851 Acquired keratosis [keratoderma] palmaris et plantaris: Secondary | ICD-10-CM | POA: Diagnosis not present

## 2015-09-06 DIAGNOSIS — B351 Tinea unguium: Secondary | ICD-10-CM | POA: Diagnosis not present

## 2015-09-06 DIAGNOSIS — E1142 Type 2 diabetes mellitus with diabetic polyneuropathy: Secondary | ICD-10-CM | POA: Diagnosis not present

## 2015-09-30 DIAGNOSIS — R69 Illness, unspecified: Secondary | ICD-10-CM | POA: Diagnosis not present

## 2015-10-22 ENCOUNTER — Ambulatory Visit: Payer: Medicare HMO | Admitting: Family Medicine

## 2015-11-26 ENCOUNTER — Other Ambulatory Visit: Payer: Self-pay | Admitting: Family Medicine

## 2015-11-26 DIAGNOSIS — Z1231 Encounter for screening mammogram for malignant neoplasm of breast: Secondary | ICD-10-CM

## 2015-12-03 DIAGNOSIS — E1142 Type 2 diabetes mellitus with diabetic polyneuropathy: Secondary | ICD-10-CM | POA: Diagnosis not present

## 2015-12-03 DIAGNOSIS — L851 Acquired keratosis [keratoderma] palmaris et plantaris: Secondary | ICD-10-CM | POA: Diagnosis not present

## 2015-12-03 DIAGNOSIS — B351 Tinea unguium: Secondary | ICD-10-CM | POA: Diagnosis not present

## 2015-12-06 ENCOUNTER — Ambulatory Visit (HOSPITAL_COMMUNITY): Payer: Medicare HMO

## 2015-12-09 DIAGNOSIS — R69 Illness, unspecified: Secondary | ICD-10-CM | POA: Diagnosis not present

## 2015-12-13 ENCOUNTER — Ambulatory Visit (HOSPITAL_COMMUNITY)
Admission: RE | Admit: 2015-12-13 | Discharge: 2015-12-13 | Disposition: A | Discharge status: Home / Self Care | Payer: Medicare HMO | Source: Ambulatory Visit | Attending: Family Medicine | Admitting: Family Medicine

## 2015-12-13 DIAGNOSIS — Z1231 Encounter for screening mammogram for malignant neoplasm of breast: Secondary | ICD-10-CM | POA: Diagnosis not present

## 2015-12-28 ENCOUNTER — Telehealth: Payer: Self-pay | Admitting: Orthopaedic Surgery

## 2016-01-01 ENCOUNTER — Encounter: Payer: Self-pay | Admitting: Family Medicine

## 2016-01-01 ENCOUNTER — Ambulatory Visit (INDEPENDENT_AMBULATORY_CARE_PROVIDER_SITE_OTHER): Payer: Medicare HMO | Admitting: Family Medicine

## 2016-01-01 VITALS — BP 140/80 | Ht 67.0 in | Wt 224.2 lb

## 2016-01-01 DIAGNOSIS — E785 Hyperlipidemia, unspecified: Secondary | ICD-10-CM | POA: Diagnosis not present

## 2016-01-01 DIAGNOSIS — Z79899 Other long term (current) drug therapy: Secondary | ICD-10-CM | POA: Diagnosis not present

## 2016-01-01 DIAGNOSIS — E0811 Diabetes mellitus due to underlying condition with ketoacidosis with coma: Secondary | ICD-10-CM

## 2016-01-01 DIAGNOSIS — Z0000 Encounter for general adult medical examination without abnormal findings: Secondary | ICD-10-CM | POA: Diagnosis not present

## 2016-01-01 DIAGNOSIS — E119 Type 2 diabetes mellitus without complications: Secondary | ICD-10-CM

## 2016-01-01 DIAGNOSIS — I10 Essential (primary) hypertension: Secondary | ICD-10-CM

## 2016-01-01 LAB — POCT GLYCOSYLATED HEMOGLOBIN (HGB A1C): Hemoglobin A1C: 8.1

## 2016-01-01 NOTE — Progress Notes (Signed)
Subjective:    Patient ID: Maria Esparza, female    DOB: 1946-01-10, 70 y.o.   MRN: [id number]  HPI AWV- Annual Wellness Visit  The patient was seen for their annual wellness visit. The patient's past medical history, surgical history, and family history were reviewed. Pertinent vaccines were reviewed ( tetanus, pneumonia, shingles, flu) The patient's medication list was reviewed and updated.  The height and weight were entered. The patient's current BMI is:35.12  Cognitive screening was completed. Outcome of Mini - Cog: passed  Falls within the past 6 months:none  Current tobacco usage: non smoker (All patients who use tobacco were given written and verbal information on quitting)  Recent listing of emergency department/hospitalizations over the past year were reviewed.  current specialist the patient sees on a regular basis:    Medicare annual wellness visit patient questionnaire was reviewed.  A written screening schedule for the patient for the next 5-10 years was given. Appropriate discussion of followup regarding next visit was discussed.   Patient states her blood sugar has been running high.   Results for orders placed or performed in visit on 06/24/15  Urine Culture  Result Value Ref Range   Urine Culture, Routine Final report (A)    Urine Culture result 1 Comment (A)   POCT urinalysis dipstick  Result Value Ref Range   Color, UA Yellow    Clarity, UA Cloudy    Glucose, UA     Bilirubin, UA     Ketones, UA     Spec Grav, UA 1.015    Blood, UA Trace    pH, UA 5.0    Protein, UA     Urobilinogen, UA     Nitrite, UA     Leukocytes, UA large (3+) (A) Negative  POCT HgB A1C  Result Value Ref Range   Hemoglobin A1C 5.7    Results for orders placed or performed in visit on 01/01/16  POCT glycosylated hemoglobin (Hb A1C)  Result Value Ref Range   Hemoglobin A1C 8.1     Dr Caprice Beaver frequently seeing qmonthly   Review of Systems  Constitutional:  Negative for activity change, appetite change and fatigue.  HENT: Negative for congestion, ear discharge and rhinorrhea.   Eyes: Negative for discharge.  Respiratory: Negative for cough, chest tightness and wheezing.   Cardiovascular: Negative for chest pain.  Gastrointestinal: Negative for abdominal pain and vomiting.  Genitourinary: Negative for difficulty urinating and frequency.  Musculoskeletal: Negative for neck pain.  Allergic/Immunologic: Negative for environmental allergies and food allergies.  Neurological: Negative for weakness and headaches.  Psychiatric/Behavioral: Negative for agitation and behavioral problems.  All other systems reviewed and are negative.      Objective:   Physical Exam  Constitutional: She is oriented to person, place, and time. She appears well-developed and well-nourished.  Substantial obesity present  HENT:  Head: Normocephalic.  Right Ear: External ear normal.  Left Ear: External ear normal.  Eyes: Pupils are equal, round, and reactive to light.  Neck: Normal range of motion. No thyromegaly present.  Cardiovascular: Normal rate, regular rhythm, normal heart sounds and intact distal pulses.   No murmur heard. Pulmonary/Chest: Effort normal and breath sounds normal. No respiratory distress. She has no wheezes.  Abdominal: Soft. Bowel sounds are normal. She exhibits no distension and no mass. There is no tenderness.  Musculoskeletal: Normal range of motion. She exhibits no edema or tenderness.  Lymphadenopathy:    She has no cervical adenopathy.  Neurological: She is  alert and oriented to person, place, and time. She exhibits normal muscle tone.  Skin: Skin is warm and dry.  Psychiatric: She has a normal mood and affect. Her behavior is normal.  Vitals reviewed.         Assessment & Plan:  Impression 1 wellness exam up-to-date on mammogram and colonoscopy. #2 type 2 diabetes worsening control discussed at length. Patient very frustrated by  this. #3 hyperlipidemia status uncertain #4 hypertension good control discussed plan appropriate blood work. Diet exercise discussed. Recheck for further discussion WSL

## 2016-01-06 DIAGNOSIS — E119 Type 2 diabetes mellitus without complications: Secondary | ICD-10-CM | POA: Diagnosis not present

## 2016-01-06 DIAGNOSIS — Z79899 Other long term (current) drug therapy: Secondary | ICD-10-CM | POA: Diagnosis not present

## 2016-01-06 DIAGNOSIS — Z0000 Encounter for general adult medical examination without abnormal findings: Secondary | ICD-10-CM | POA: Diagnosis not present

## 2016-01-06 DIAGNOSIS — E785 Hyperlipidemia, unspecified: Secondary | ICD-10-CM | POA: Diagnosis not present

## 2016-01-07 LAB — BASIC METABOLIC PANEL
BUN/Creatinine Ratio: 15 (ref 12–28)
BUN: 12 mg/dL (ref 8–27)
CALCIUM: 9.1 mg/dL (ref 8.7–10.3)
CO2: 26 mmol/L (ref 18–29)
Chloride: 96 mmol/L (ref 96–106)
Creatinine, Ser: 0.79 mg/dL (ref 0.57–1.00)
GFR calc Af Amer: 87 mL/min/{1.73_m2} (ref >59)
GFR, EST NON AFRICAN AMERICAN: 76 mL/min/{1.73_m2} (ref >59)
Glucose: 240 mg/dL — ABNORMAL HIGH (ref 65–99)
POTASSIUM: 5 mmol/L (ref 3.5–5.2)
Sodium: 137 mmol/L (ref 134–144)

## 2016-01-07 LAB — HEPATIC FUNCTION PANEL
ALBUMIN: 4.1 g/dL (ref 3.5–4.8)
ALT: 68 IU/L — ABNORMAL HIGH (ref 0–32)
AST: 59 IU/L — AB (ref 0–40)
Alkaline Phosphatase: 76 IU/L (ref 39–117)
Bilirubin Total: 0.3 mg/dL (ref 0.0–1.2)
Bilirubin, Direct: 0.08 mg/dL (ref 0.00–0.40)
TOTAL PROTEIN: 6.5 g/dL (ref 6.0–8.5)

## 2016-01-07 LAB — MICROALBUMIN / CREATININE URINE RATIO
CREATININE, UR: 52.9 mg/dL
MICROALBUM., U, RANDOM: 24.9 ug/mL
Microalb/Creat Ratio: 47.1 mg/g creat — ABNORMAL HIGH (ref 0.0–30.0)

## 2016-01-07 LAB — LIPID PANEL
CHOL/HDL RATIO: 4.4 ratio (ref 0.0–4.4)
CHOLESTEROL TOTAL: 231 mg/dL — AB (ref 100–199)
HDL: 53 mg/dL (ref >39)
LDL CALC: 108 mg/dL — AB (ref 0–99)
Triglycerides: 352 mg/dL — ABNORMAL HIGH (ref 0–149)
VLDL CHOLESTEROL CAL: 70 mg/dL — AB (ref 5–40)

## 2016-01-09 ENCOUNTER — Encounter: Payer: Self-pay | Admitting: Family Medicine

## 2016-01-09 ENCOUNTER — Ambulatory Visit (INDEPENDENT_AMBULATORY_CARE_PROVIDER_SITE_OTHER): Payer: Medicare HMO | Admitting: Family Medicine

## 2016-01-09 VITALS — BP 122/66 | Ht 67.0 in | Wt 224.4 lb

## 2016-01-09 DIAGNOSIS — E119 Type 2 diabetes mellitus without complications: Secondary | ICD-10-CM | POA: Diagnosis not present

## 2016-01-09 MED ORDER — CANAGLIFLOZIN 100 MG PO TABS: 100.0000 mg | ORAL_TABLET | Freq: Every day | ORAL | 5 refills | Status: DC

## 2016-01-09 NOTE — Patient Instructions (Signed)
Please call Maria Esparza in several weeks with your sugar levels

## 2016-01-09 NOTE — Progress Notes (Signed)
   Subjective:    Patient ID: Maria Esparza, female    DOB: February 09, 1946, 70 y.o.   MRN: [id number]  Diabetes  She presents for her follow-up diabetic visit. She has type 2 diabetes mellitus.  no low sugar spells  Not exrcising  Overall diet is pretty good   Patient in today to discuss blood sugars running in the upper 200's and 300's.  States no other concerns this visit.   Review of Systems No headache, no major weight loss or weight gain, no chest pain no back pain abdominal pain no change in bowel habits complete ROS otherwise negative     Objective:   Physical Exam Alert vitals stable, NAD. Blood pressure good on repeat. HEENT normal. Lungs clear. Heart regular rate and rhythm.        Assessment & Plan:  Impression 1 type 2 diabetes suboptimum control. On maximum doses of current meds. Plan add invokana Side effects benefits discussed. Call us in several weeks. Recheck in 3 months. May need to increase Invega, strength based on results WSL

## 2016-01-11 ENCOUNTER — Other Ambulatory Visit: Payer: Self-pay | Admitting: Nurse Practitioner

## 2016-02-06 ENCOUNTER — Telehealth: Payer: Self-pay | Admitting: Family Medicine

## 2016-02-06 NOTE — Telephone Encounter (Signed)
Pt dropped off her blood sugar readings and is wanting someone to review them to see if she needs to continue taking the 100 mg of invokana. Please advise. Readings are in nurse box in blue folder.

## 2016-02-06 NOTE — Telephone Encounter (Signed)
Dr. Richardson Landry will review on Monday to see if she should continue 100 mg or change dose

## 2016-02-06 NOTE — Telephone Encounter (Signed)
Glucose readings readings forwarded to Dr.Scott Luking in the absence of Dr.Steve Luking. Readings in yellow folder in [place] office.

## 2016-02-12 ENCOUNTER — Other Ambulatory Visit: Payer: Self-pay | Admitting: *Deleted

## 2016-02-12 MED ORDER — CANAGLIFLOZIN 100 MG PO TABS: ORAL_TABLET | ORAL | 5 refills | Status: DC

## 2016-02-12 NOTE — Telephone Encounter (Signed)
oncr to two 100 mg per day

## 2016-02-12 NOTE — Telephone Encounter (Signed)
Pt has called back wanting to know what she is to do. Pt has ran out of the [facility] and is needing to know if she needs to continue it or what. Please advise.

## 2016-02-12 NOTE — Telephone Encounter (Signed)
Discussed with pt. Med sent to pharm.  

## 2016-02-17 DIAGNOSIS — R69 Illness, unspecified: Secondary | ICD-10-CM | POA: Diagnosis not present

## 2016-02-18 ENCOUNTER — Other Ambulatory Visit: Payer: Self-pay | Admitting: *Deleted

## 2016-02-18 MED ORDER — METFORMIN HCL 500 MG PO TABS: ORAL_TABLET | ORAL | 5 refills | Status: AC

## 2016-02-27 DIAGNOSIS — E119 Type 2 diabetes mellitus without complications: Secondary | ICD-10-CM | POA: Diagnosis not present

## 2016-03-03 DIAGNOSIS — B351 Tinea unguium: Secondary | ICD-10-CM | POA: Diagnosis not present

## 2016-03-03 DIAGNOSIS — E1142 Type 2 diabetes mellitus with diabetic polyneuropathy: Secondary | ICD-10-CM | POA: Diagnosis not present

## 2016-03-03 DIAGNOSIS — L851 Acquired keratosis [keratoderma] palmaris et plantaris: Secondary | ICD-10-CM | POA: Diagnosis not present

## 2016-03-17 DIAGNOSIS — E785 Hyperlipidemia, unspecified: Secondary | ICD-10-CM | POA: Diagnosis not present

## 2016-03-17 DIAGNOSIS — I10 Essential (primary) hypertension: Secondary | ICD-10-CM | POA: Diagnosis not present

## 2016-03-17 DIAGNOSIS — M545 Low back pain: Secondary | ICD-10-CM | POA: Diagnosis not present

## 2016-03-17 DIAGNOSIS — E119 Type 2 diabetes mellitus without complications: Secondary | ICD-10-CM | POA: Diagnosis not present

## 2016-03-27 DIAGNOSIS — E785 Hyperlipidemia, unspecified: Secondary | ICD-10-CM | POA: Diagnosis not present

## 2016-03-27 DIAGNOSIS — E119 Type 2 diabetes mellitus without complications: Secondary | ICD-10-CM | POA: Diagnosis not present

## 2016-04-01 DIAGNOSIS — I10 Essential (primary) hypertension: Secondary | ICD-10-CM | POA: Diagnosis not present

## 2016-04-01 DIAGNOSIS — R945 Abnormal results of liver function studies: Secondary | ICD-10-CM | POA: Diagnosis not present

## 2016-04-01 DIAGNOSIS — E119 Type 2 diabetes mellitus without complications: Secondary | ICD-10-CM | POA: Diagnosis not present

## 2016-04-01 DIAGNOSIS — E785 Hyperlipidemia, unspecified: Secondary | ICD-10-CM | POA: Diagnosis not present

## 2016-04-01 DIAGNOSIS — Z0000 Encounter for general adult medical examination without abnormal findings: Secondary | ICD-10-CM | POA: Diagnosis not present

## 2016-04-01 DIAGNOSIS — M545 Low back pain: Secondary | ICD-10-CM | POA: Diagnosis not present

## 2016-04-01 DIAGNOSIS — M67442 Ganglion, left hand: Secondary | ICD-10-CM | POA: Diagnosis not present

## 2016-04-10 ENCOUNTER — Ambulatory Visit: Payer: Medicare HMO | Admitting: Family Medicine

## 2016-04-22 DIAGNOSIS — E785 Hyperlipidemia, unspecified: Secondary | ICD-10-CM | POA: Diagnosis not present

## 2016-04-22 DIAGNOSIS — I10 Essential (primary) hypertension: Secondary | ICD-10-CM | POA: Diagnosis not present

## 2016-04-22 DIAGNOSIS — E119 Type 2 diabetes mellitus without complications: Secondary | ICD-10-CM | POA: Diagnosis not present

## 2016-04-22 DIAGNOSIS — Z23 Encounter for immunization: Secondary | ICD-10-CM | POA: Diagnosis not present

## 2016-05-19 DIAGNOSIS — E1142 Type 2 diabetes mellitus with diabetic polyneuropathy: Secondary | ICD-10-CM | POA: Diagnosis not present

## 2016-05-19 DIAGNOSIS — L851 Acquired keratosis [keratoderma] palmaris et plantaris: Secondary | ICD-10-CM | POA: Diagnosis not present

## 2016-05-19 DIAGNOSIS — B351 Tinea unguium: Secondary | ICD-10-CM | POA: Diagnosis not present

## 2016-05-20 DIAGNOSIS — Z713 Dietary counseling and surveillance: Secondary | ICD-10-CM | POA: Diagnosis not present

## 2016-05-20 DIAGNOSIS — E785 Hyperlipidemia, unspecified: Secondary | ICD-10-CM | POA: Diagnosis not present

## 2016-05-20 DIAGNOSIS — E119 Type 2 diabetes mellitus without complications: Secondary | ICD-10-CM | POA: Diagnosis not present

## 2016-05-20 DIAGNOSIS — Z6833 Body mass index (BMI) 33.0-33.9, adult: Secondary | ICD-10-CM | POA: Diagnosis not present

## 2016-05-20 DIAGNOSIS — I10 Essential (primary) hypertension: Secondary | ICD-10-CM | POA: Diagnosis not present

## 2016-05-20 DIAGNOSIS — R69 Illness, unspecified: Secondary | ICD-10-CM | POA: Diagnosis not present

## 2016-07-01 DIAGNOSIS — E785 Hyperlipidemia, unspecified: Secondary | ICD-10-CM | POA: Diagnosis not present

## 2016-07-01 DIAGNOSIS — E119 Type 2 diabetes mellitus without complications: Secondary | ICD-10-CM | POA: Diagnosis not present

## 2016-07-01 DIAGNOSIS — Z6832 Body mass index (BMI) 32.0-32.9, adult: Secondary | ICD-10-CM | POA: Diagnosis not present

## 2016-07-01 DIAGNOSIS — I10 Essential (primary) hypertension: Secondary | ICD-10-CM | POA: Diagnosis not present

## 2016-07-03 DIAGNOSIS — R69 Illness, unspecified: Secondary | ICD-10-CM | POA: Diagnosis not present

## 2016-08-04 DIAGNOSIS — E1142 Type 2 diabetes mellitus with diabetic polyneuropathy: Secondary | ICD-10-CM | POA: Diagnosis not present

## 2016-08-04 DIAGNOSIS — L851 Acquired keratosis [keratoderma] palmaris et plantaris: Secondary | ICD-10-CM | POA: Diagnosis not present

## 2016-08-04 DIAGNOSIS — B351 Tinea unguium: Secondary | ICD-10-CM | POA: Diagnosis not present

## 2016-08-27 ENCOUNTER — Other Ambulatory Visit: Payer: Self-pay | Admitting: Family Medicine

## 2016-08-27 NOTE — Telephone Encounter (Signed)
Last seen 01/09/16

## 2016-08-27 NOTE — Telephone Encounter (Signed)
30 d worth, call pt and sched f u visit

## 2016-09-14 DIAGNOSIS — E119 Type 2 diabetes mellitus without complications: Secondary | ICD-10-CM | POA: Diagnosis not present

## 2016-09-18 DIAGNOSIS — E119 Type 2 diabetes mellitus without complications: Secondary | ICD-10-CM | POA: Diagnosis not present

## 2016-09-22 DIAGNOSIS — E119 Type 2 diabetes mellitus without complications: Secondary | ICD-10-CM | POA: Diagnosis not present

## 2016-09-22 DIAGNOSIS — Z6834 Body mass index (BMI) 34.0-34.9, adult: Secondary | ICD-10-CM | POA: Diagnosis not present

## 2016-09-22 DIAGNOSIS — I10 Essential (primary) hypertension: Secondary | ICD-10-CM | POA: Diagnosis not present

## 2016-09-22 DIAGNOSIS — E785 Hyperlipidemia, unspecified: Secondary | ICD-10-CM | POA: Diagnosis not present

## 2016-09-22 DIAGNOSIS — R202 Paresthesia of skin: Secondary | ICD-10-CM | POA: Diagnosis not present

## 2016-09-29 DIAGNOSIS — N3281 Overactive bladder: Secondary | ICD-10-CM | POA: Diagnosis not present

## 2016-09-29 DIAGNOSIS — N3941 Urge incontinence: Secondary | ICD-10-CM | POA: Diagnosis not present

## 2016-10-09 DIAGNOSIS — R197 Diarrhea, unspecified: Secondary | ICD-10-CM | POA: Diagnosis not present

## 2016-10-09 DIAGNOSIS — N3281 Overactive bladder: Secondary | ICD-10-CM | POA: Diagnosis not present

## 2016-10-09 DIAGNOSIS — Z6834 Body mass index (BMI) 34.0-34.9, adult: Secondary | ICD-10-CM | POA: Diagnosis not present

## 2016-10-13 DIAGNOSIS — B351 Tinea unguium: Secondary | ICD-10-CM | POA: Diagnosis not present

## 2016-10-13 DIAGNOSIS — L851 Acquired keratosis [keratoderma] palmaris et plantaris: Secondary | ICD-10-CM | POA: Diagnosis not present

## 2016-10-13 DIAGNOSIS — E1142 Type 2 diabetes mellitus with diabetic polyneuropathy: Secondary | ICD-10-CM | POA: Diagnosis not present

## 2016-10-14 DIAGNOSIS — N3281 Overactive bladder: Secondary | ICD-10-CM | POA: Diagnosis not present

## 2016-10-14 DIAGNOSIS — N3946 Mixed incontinence: Secondary | ICD-10-CM | POA: Diagnosis not present

## 2016-10-14 DIAGNOSIS — Z6834 Body mass index (BMI) 34.0-34.9, adult: Secondary | ICD-10-CM | POA: Diagnosis not present

## 2016-10-14 DIAGNOSIS — R197 Diarrhea, unspecified: Secondary | ICD-10-CM | POA: Diagnosis not present

## 2016-10-25 DIAGNOSIS — R69 Illness, unspecified: Secondary | ICD-10-CM | POA: Diagnosis not present

## 2016-11-04 DIAGNOSIS — K582 Mixed irritable bowel syndrome: Secondary | ICD-10-CM | POA: Diagnosis not present

## 2016-11-04 DIAGNOSIS — Z6835 Body mass index (BMI) 35.0-35.9, adult: Secondary | ICD-10-CM | POA: Diagnosis not present

## 2016-11-04 DIAGNOSIS — N3281 Overactive bladder: Secondary | ICD-10-CM | POA: Diagnosis not present

## 2016-11-04 DIAGNOSIS — N3946 Mixed incontinence: Secondary | ICD-10-CM | POA: Diagnosis not present

## 2016-12-02 ENCOUNTER — Other Ambulatory Visit (HOSPITAL_COMMUNITY): Payer: Self-pay | Admitting: Internal Medicine

## 2016-12-02 DIAGNOSIS — Z1231 Encounter for screening mammogram for malignant neoplasm of breast: Secondary | ICD-10-CM

## 2016-12-18 ENCOUNTER — Ambulatory Visit (HOSPITAL_COMMUNITY)
Admission: RE | Admit: 2016-12-18 | Discharge: 2016-12-18 | Disposition: A | Discharge status: Home / Self Care | Payer: Medicare HMO | Source: Ambulatory Visit | Attending: Internal Medicine | Admitting: Internal Medicine

## 2016-12-18 DIAGNOSIS — Z1231 Encounter for screening mammogram for malignant neoplasm of breast: Secondary | ICD-10-CM | POA: Insufficient documentation

## 2016-12-22 DIAGNOSIS — E119 Type 2 diabetes mellitus without complications: Secondary | ICD-10-CM | POA: Diagnosis not present

## 2016-12-22 DIAGNOSIS — B351 Tinea unguium: Secondary | ICD-10-CM | POA: Diagnosis not present

## 2016-12-22 DIAGNOSIS — E1142 Type 2 diabetes mellitus with diabetic polyneuropathy: Secondary | ICD-10-CM | POA: Diagnosis not present

## 2016-12-22 DIAGNOSIS — L851 Acquired keratosis [keratoderma] palmaris et plantaris: Secondary | ICD-10-CM | POA: Diagnosis not present

## 2017-01-13 DIAGNOSIS — E785 Hyperlipidemia, unspecified: Secondary | ICD-10-CM | POA: Diagnosis not present

## 2017-01-13 DIAGNOSIS — E119 Type 2 diabetes mellitus without complications: Secondary | ICD-10-CM | POA: Diagnosis not present

## 2017-01-13 DIAGNOSIS — I10 Essential (primary) hypertension: Secondary | ICD-10-CM | POA: Diagnosis not present

## 2017-01-19 DIAGNOSIS — G629 Polyneuropathy, unspecified: Secondary | ICD-10-CM | POA: Diagnosis not present

## 2017-01-19 DIAGNOSIS — Z6835 Body mass index (BMI) 35.0-35.9, adult: Secondary | ICD-10-CM | POA: Diagnosis not present

## 2017-01-19 DIAGNOSIS — I10 Essential (primary) hypertension: Secondary | ICD-10-CM | POA: Diagnosis not present

## 2017-01-19 DIAGNOSIS — E785 Hyperlipidemia, unspecified: Secondary | ICD-10-CM | POA: Diagnosis not present

## 2017-01-19 DIAGNOSIS — Z0000 Encounter for general adult medical examination without abnormal findings: Secondary | ICD-10-CM | POA: Diagnosis not present

## 2017-01-19 DIAGNOSIS — D509 Iron deficiency anemia, unspecified: Secondary | ICD-10-CM | POA: Diagnosis not present

## 2017-01-19 DIAGNOSIS — E119 Type 2 diabetes mellitus without complications: Secondary | ICD-10-CM | POA: Diagnosis not present

## 2017-02-25 DIAGNOSIS — I10 Essential (primary) hypertension: Secondary | ICD-10-CM | POA: Diagnosis not present

## 2017-02-25 DIAGNOSIS — D509 Iron deficiency anemia, unspecified: Secondary | ICD-10-CM | POA: Diagnosis not present

## 2017-02-25 DIAGNOSIS — E119 Type 2 diabetes mellitus without complications: Secondary | ICD-10-CM | POA: Diagnosis not present

## 2017-02-25 DIAGNOSIS — Z6835 Body mass index (BMI) 35.0-35.9, adult: Secondary | ICD-10-CM | POA: Diagnosis not present

## 2017-02-25 DIAGNOSIS — M545 Low back pain: Secondary | ICD-10-CM | POA: Diagnosis not present

## 2017-02-25 DIAGNOSIS — Z0000 Encounter for general adult medical examination without abnormal findings: Secondary | ICD-10-CM | POA: Diagnosis not present

## 2017-02-25 DIAGNOSIS — N3281 Overactive bladder: Secondary | ICD-10-CM | POA: Diagnosis not present

## 2017-02-25 DIAGNOSIS — E785 Hyperlipidemia, unspecified: Secondary | ICD-10-CM | POA: Diagnosis not present

## 2017-02-25 DIAGNOSIS — R945 Abnormal results of liver function studies: Secondary | ICD-10-CM | POA: Diagnosis not present

## 2017-02-25 DIAGNOSIS — G629 Polyneuropathy, unspecified: Secondary | ICD-10-CM | POA: Diagnosis not present

## 2017-03-01 DIAGNOSIS — E119 Type 2 diabetes mellitus without complications: Secondary | ICD-10-CM | POA: Diagnosis not present

## 2017-03-01 DIAGNOSIS — D509 Iron deficiency anemia, unspecified: Secondary | ICD-10-CM | POA: Diagnosis not present

## 2017-03-01 DIAGNOSIS — I10 Essential (primary) hypertension: Secondary | ICD-10-CM | POA: Diagnosis not present

## 2017-03-01 DIAGNOSIS — Z6836 Body mass index (BMI) 36.0-36.9, adult: Secondary | ICD-10-CM | POA: Diagnosis not present

## 2017-03-13 DIAGNOSIS — M79652 Pain in left thigh: Secondary | ICD-10-CM | POA: Diagnosis not present

## 2017-03-13 DIAGNOSIS — M545 Low back pain: Secondary | ICD-10-CM | POA: Diagnosis not present

## 2017-03-19 DIAGNOSIS — E1142 Type 2 diabetes mellitus with diabetic polyneuropathy: Secondary | ICD-10-CM | POA: Diagnosis not present

## 2017-03-19 DIAGNOSIS — B351 Tinea unguium: Secondary | ICD-10-CM | POA: Diagnosis not present

## 2017-03-19 DIAGNOSIS — L851 Acquired keratosis [keratoderma] palmaris et plantaris: Secondary | ICD-10-CM | POA: Diagnosis not present

## 2017-03-22 DIAGNOSIS — E119 Type 2 diabetes mellitus without complications: Secondary | ICD-10-CM | POA: Diagnosis not present

## 2017-04-12 DIAGNOSIS — J069 Acute upper respiratory infection, unspecified: Secondary | ICD-10-CM | POA: Diagnosis not present

## 2017-04-16 ENCOUNTER — Other Ambulatory Visit (HOSPITAL_COMMUNITY): Payer: Self-pay | Admitting: Internal Medicine

## 2017-04-16 ENCOUNTER — Ambulatory Visit (HOSPITAL_COMMUNITY)
Admission: RE | Admit: 2017-04-16 | Discharge: 2017-04-16 | Disposition: A | Discharge status: Home / Self Care | Payer: Medicare HMO | Source: Ambulatory Visit | Attending: Family Medicine | Admitting: Family Medicine

## 2017-04-16 DIAGNOSIS — J189 Pneumonia, unspecified organism: Secondary | ICD-10-CM | POA: Diagnosis not present

## 2017-04-16 DIAGNOSIS — R918 Other nonspecific abnormal finding of lung field: Secondary | ICD-10-CM | POA: Insufficient documentation

## 2017-04-16 DIAGNOSIS — R509 Fever, unspecified: Secondary | ICD-10-CM | POA: Insufficient documentation

## 2017-04-16 DIAGNOSIS — R0602 Shortness of breath: Secondary | ICD-10-CM | POA: Insufficient documentation

## 2017-04-16 DIAGNOSIS — R0782 Intercostal pain: Secondary | ICD-10-CM | POA: Diagnosis not present

## 2017-04-16 DIAGNOSIS — R05 Cough: Secondary | ICD-10-CM | POA: Diagnosis not present

## 2017-04-16 DIAGNOSIS — J069 Acute upper respiratory infection, unspecified: Secondary | ICD-10-CM | POA: Diagnosis not present

## 2017-05-11 DIAGNOSIS — M25552 Pain in left hip: Secondary | ICD-10-CM | POA: Diagnosis not present

## 2017-05-11 DIAGNOSIS — Z6835 Body mass index (BMI) 35.0-35.9, adult: Secondary | ICD-10-CM | POA: Diagnosis not present

## 2017-05-11 DIAGNOSIS — G629 Polyneuropathy, unspecified: Secondary | ICD-10-CM | POA: Diagnosis not present

## 2017-05-11 DIAGNOSIS — D509 Iron deficiency anemia, unspecified: Secondary | ICD-10-CM | POA: Diagnosis not present

## 2017-05-11 DIAGNOSIS — R05 Cough: Secondary | ICD-10-CM | POA: Diagnosis not present

## 2017-05-11 DIAGNOSIS — Z0000 Encounter for general adult medical examination without abnormal findings: Secondary | ICD-10-CM | POA: Diagnosis not present

## 2017-05-11 DIAGNOSIS — J069 Acute upper respiratory infection, unspecified: Secondary | ICD-10-CM | POA: Diagnosis not present

## 2017-05-11 DIAGNOSIS — R0782 Intercostal pain: Secondary | ICD-10-CM | POA: Diagnosis not present

## 2017-05-11 DIAGNOSIS — E119 Type 2 diabetes mellitus without complications: Secondary | ICD-10-CM | POA: Diagnosis not present

## 2017-05-11 DIAGNOSIS — I10 Essential (primary) hypertension: Secondary | ICD-10-CM | POA: Diagnosis not present

## 2017-06-14 DIAGNOSIS — D509 Iron deficiency anemia, unspecified: Secondary | ICD-10-CM | POA: Diagnosis not present

## 2017-06-14 DIAGNOSIS — I10 Essential (primary) hypertension: Secondary | ICD-10-CM | POA: Diagnosis not present

## 2017-06-14 DIAGNOSIS — Z0000 Encounter for general adult medical examination without abnormal findings: Secondary | ICD-10-CM | POA: Diagnosis not present

## 2017-06-14 DIAGNOSIS — Z6835 Body mass index (BMI) 35.0-35.9, adult: Secondary | ICD-10-CM | POA: Diagnosis not present

## 2017-06-14 DIAGNOSIS — R05 Cough: Secondary | ICD-10-CM | POA: Diagnosis not present

## 2017-06-14 DIAGNOSIS — M25552 Pain in left hip: Secondary | ICD-10-CM | POA: Diagnosis not present

## 2017-06-14 DIAGNOSIS — R0782 Intercostal pain: Secondary | ICD-10-CM | POA: Diagnosis not present

## 2017-06-14 DIAGNOSIS — G629 Polyneuropathy, unspecified: Secondary | ICD-10-CM | POA: Diagnosis not present

## 2017-06-14 DIAGNOSIS — J069 Acute upper respiratory infection, unspecified: Secondary | ICD-10-CM | POA: Diagnosis not present

## 2017-06-14 DIAGNOSIS — E119 Type 2 diabetes mellitus without complications: Secondary | ICD-10-CM | POA: Diagnosis not present

## 2017-06-16 DIAGNOSIS — Z6838 Body mass index (BMI) 38.0-38.9, adult: Secondary | ICD-10-CM | POA: Diagnosis not present

## 2017-06-16 DIAGNOSIS — E119 Type 2 diabetes mellitus without complications: Secondary | ICD-10-CM | POA: Diagnosis not present

## 2017-06-16 DIAGNOSIS — Z0000 Encounter for general adult medical examination without abnormal findings: Secondary | ICD-10-CM | POA: Diagnosis not present

## 2017-06-16 DIAGNOSIS — D509 Iron deficiency anemia, unspecified: Secondary | ICD-10-CM | POA: Diagnosis not present

## 2017-06-16 DIAGNOSIS — I10 Essential (primary) hypertension: Secondary | ICD-10-CM | POA: Diagnosis not present

## 2017-06-22 DIAGNOSIS — B351 Tinea unguium: Secondary | ICD-10-CM | POA: Diagnosis not present

## 2017-06-22 DIAGNOSIS — L851 Acquired keratosis [keratoderma] palmaris et plantaris: Secondary | ICD-10-CM | POA: Diagnosis not present

## 2017-06-22 DIAGNOSIS — E1142 Type 2 diabetes mellitus with diabetic polyneuropathy: Secondary | ICD-10-CM | POA: Diagnosis not present

## 2017-06-28 DIAGNOSIS — L708 Other acne: Secondary | ICD-10-CM | POA: Diagnosis not present

## 2017-06-28 DIAGNOSIS — L821 Other seborrheic keratosis: Secondary | ICD-10-CM | POA: Diagnosis not present

## 2017-09-16 DIAGNOSIS — E119 Type 2 diabetes mellitus without complications: Secondary | ICD-10-CM | POA: Diagnosis not present

## 2017-09-21 DIAGNOSIS — E1142 Type 2 diabetes mellitus with diabetic polyneuropathy: Secondary | ICD-10-CM | POA: Diagnosis not present

## 2017-09-21 DIAGNOSIS — L851 Acquired keratosis [keratoderma] palmaris et plantaris: Secondary | ICD-10-CM | POA: Diagnosis not present

## 2017-09-21 DIAGNOSIS — B351 Tinea unguium: Secondary | ICD-10-CM | POA: Diagnosis not present

## 2017-09-22 DIAGNOSIS — D509 Iron deficiency anemia, unspecified: Secondary | ICD-10-CM | POA: Diagnosis not present

## 2017-09-22 DIAGNOSIS — E785 Hyperlipidemia, unspecified: Secondary | ICD-10-CM | POA: Diagnosis not present

## 2017-09-22 DIAGNOSIS — E119 Type 2 diabetes mellitus without complications: Secondary | ICD-10-CM | POA: Diagnosis not present

## 2017-09-22 DIAGNOSIS — I10 Essential (primary) hypertension: Secondary | ICD-10-CM | POA: Diagnosis not present

## 2017-09-24 DIAGNOSIS — E782 Mixed hyperlipidemia: Secondary | ICD-10-CM | POA: Diagnosis not present

## 2017-09-24 DIAGNOSIS — Z6839 Body mass index (BMI) 39.0-39.9, adult: Secondary | ICD-10-CM | POA: Diagnosis not present

## 2017-09-24 DIAGNOSIS — E1165 Type 2 diabetes mellitus with hyperglycemia: Secondary | ICD-10-CM | POA: Diagnosis not present

## 2017-09-24 DIAGNOSIS — Z712 Person consulting for explanation of examination or test findings: Secondary | ICD-10-CM | POA: Diagnosis not present

## 2017-09-24 DIAGNOSIS — D509 Iron deficiency anemia, unspecified: Secondary | ICD-10-CM | POA: Diagnosis not present

## 2017-09-24 DIAGNOSIS — N3281 Overactive bladder: Secondary | ICD-10-CM | POA: Diagnosis not present

## 2017-09-24 DIAGNOSIS — I10 Essential (primary) hypertension: Secondary | ICD-10-CM | POA: Diagnosis not present

## 2017-10-27 DIAGNOSIS — E782 Mixed hyperlipidemia: Secondary | ICD-10-CM | POA: Diagnosis not present

## 2017-10-27 DIAGNOSIS — E785 Hyperlipidemia, unspecified: Secondary | ICD-10-CM | POA: Diagnosis not present

## 2017-10-27 DIAGNOSIS — E1165 Type 2 diabetes mellitus with hyperglycemia: Secondary | ICD-10-CM | POA: Diagnosis not present

## 2017-10-27 DIAGNOSIS — E119 Type 2 diabetes mellitus without complications: Secondary | ICD-10-CM | POA: Diagnosis not present

## 2017-10-27 DIAGNOSIS — I10 Essential (primary) hypertension: Secondary | ICD-10-CM | POA: Diagnosis not present

## 2017-10-27 DIAGNOSIS — G629 Polyneuropathy, unspecified: Secondary | ICD-10-CM | POA: Diagnosis not present

## 2017-11-03 DIAGNOSIS — Z0000 Encounter for general adult medical examination without abnormal findings: Secondary | ICD-10-CM | POA: Diagnosis not present

## 2017-11-03 DIAGNOSIS — G629 Polyneuropathy, unspecified: Secondary | ICD-10-CM | POA: Diagnosis not present

## 2017-11-03 DIAGNOSIS — D509 Iron deficiency anemia, unspecified: Secondary | ICD-10-CM | POA: Diagnosis not present

## 2017-11-03 DIAGNOSIS — I10 Essential (primary) hypertension: Secondary | ICD-10-CM | POA: Diagnosis not present

## 2017-11-03 DIAGNOSIS — R0902 Hypoxemia: Secondary | ICD-10-CM | POA: Diagnosis not present

## 2017-11-03 DIAGNOSIS — E119 Type 2 diabetes mellitus without complications: Secondary | ICD-10-CM | POA: Diagnosis not present

## 2017-11-03 DIAGNOSIS — J189 Pneumonia, unspecified organism: Secondary | ICD-10-CM | POA: Diagnosis not present

## 2017-11-03 DIAGNOSIS — M25552 Pain in left hip: Secondary | ICD-10-CM | POA: Diagnosis not present

## 2017-11-03 DIAGNOSIS — R000 Tachycardia, unspecified: Secondary | ICD-10-CM | POA: Diagnosis not present

## 2017-11-03 DIAGNOSIS — Z6839 Body mass index (BMI) 39.0-39.9, adult: Secondary | ICD-10-CM | POA: Diagnosis not present

## 2017-11-03 DIAGNOSIS — R05 Cough: Secondary | ICD-10-CM | POA: Diagnosis not present

## 2017-11-03 DIAGNOSIS — R0782 Intercostal pain: Secondary | ICD-10-CM | POA: Diagnosis not present

## 2017-11-03 DIAGNOSIS — J069 Acute upper respiratory infection, unspecified: Secondary | ICD-10-CM | POA: Diagnosis not present

## 2017-11-03 DIAGNOSIS — Z6835 Body mass index (BMI) 35.0-35.9, adult: Secondary | ICD-10-CM | POA: Diagnosis not present

## 2017-11-11 DIAGNOSIS — D509 Iron deficiency anemia, unspecified: Secondary | ICD-10-CM | POA: Diagnosis not present

## 2017-11-11 DIAGNOSIS — E119 Type 2 diabetes mellitus without complications: Secondary | ICD-10-CM | POA: Diagnosis not present

## 2017-11-11 DIAGNOSIS — E782 Mixed hyperlipidemia: Secondary | ICD-10-CM | POA: Diagnosis not present

## 2017-11-11 DIAGNOSIS — R945 Abnormal results of liver function studies: Secondary | ICD-10-CM | POA: Diagnosis not present

## 2017-11-11 DIAGNOSIS — I10 Essential (primary) hypertension: Secondary | ICD-10-CM | POA: Diagnosis not present

## 2017-11-11 DIAGNOSIS — E785 Hyperlipidemia, unspecified: Secondary | ICD-10-CM | POA: Diagnosis not present

## 2017-11-11 DIAGNOSIS — E1165 Type 2 diabetes mellitus with hyperglycemia: Secondary | ICD-10-CM | POA: Diagnosis not present

## 2017-11-26 DIAGNOSIS — R0782 Intercostal pain: Secondary | ICD-10-CM | POA: Diagnosis not present

## 2017-11-26 DIAGNOSIS — Z6837 Body mass index (BMI) 37.0-37.9, adult: Secondary | ICD-10-CM | POA: Diagnosis not present

## 2017-11-26 DIAGNOSIS — I10 Essential (primary) hypertension: Secondary | ICD-10-CM | POA: Diagnosis not present

## 2017-11-26 DIAGNOSIS — I868 Varicose veins of other specified sites: Secondary | ICD-10-CM | POA: Diagnosis not present

## 2017-11-26 DIAGNOSIS — R05 Cough: Secondary | ICD-10-CM | POA: Diagnosis not present

## 2017-11-26 DIAGNOSIS — M25569 Pain in unspecified knee: Secondary | ICD-10-CM | POA: Diagnosis not present

## 2017-12-03 DIAGNOSIS — E1165 Type 2 diabetes mellitus with hyperglycemia: Secondary | ICD-10-CM | POA: Diagnosis not present

## 2017-12-03 DIAGNOSIS — E782 Mixed hyperlipidemia: Secondary | ICD-10-CM | POA: Diagnosis not present

## 2017-12-03 DIAGNOSIS — I10 Essential (primary) hypertension: Secondary | ICD-10-CM | POA: Diagnosis not present

## 2017-12-03 DIAGNOSIS — D509 Iron deficiency anemia, unspecified: Secondary | ICD-10-CM | POA: Diagnosis not present

## 2017-12-09 DIAGNOSIS — E119 Type 2 diabetes mellitus without complications: Secondary | ICD-10-CM | POA: Diagnosis not present

## 2017-12-13 DIAGNOSIS — Z6837 Body mass index (BMI) 37.0-37.9, adult: Secondary | ICD-10-CM | POA: Diagnosis not present

## 2017-12-13 DIAGNOSIS — J219 Acute bronchiolitis, unspecified: Secondary | ICD-10-CM | POA: Diagnosis not present

## 2017-12-17 ENCOUNTER — Other Ambulatory Visit (HOSPITAL_COMMUNITY): Payer: Self-pay | Admitting: Internal Medicine

## 2017-12-17 DIAGNOSIS — Z1231 Encounter for screening mammogram for malignant neoplasm of breast: Secondary | ICD-10-CM

## 2017-12-20 DIAGNOSIS — E1151 Type 2 diabetes mellitus with diabetic peripheral angiopathy without gangrene: Secondary | ICD-10-CM | POA: Diagnosis not present

## 2017-12-20 DIAGNOSIS — M79671 Pain in right foot: Secondary | ICD-10-CM | POA: Diagnosis not present

## 2017-12-20 DIAGNOSIS — L600 Ingrowing nail: Secondary | ICD-10-CM | POA: Diagnosis not present

## 2017-12-20 DIAGNOSIS — E1140 Type 2 diabetes mellitus with diabetic neuropathy, unspecified: Secondary | ICD-10-CM | POA: Diagnosis not present

## 2017-12-29 DIAGNOSIS — R0902 Hypoxemia: Secondary | ICD-10-CM | POA: Diagnosis not present

## 2017-12-29 DIAGNOSIS — G629 Polyneuropathy, unspecified: Secondary | ICD-10-CM | POA: Diagnosis not present

## 2017-12-29 DIAGNOSIS — R000 Tachycardia, unspecified: Secondary | ICD-10-CM | POA: Diagnosis not present

## 2017-12-29 DIAGNOSIS — M25552 Pain in left hip: Secondary | ICD-10-CM | POA: Diagnosis not present

## 2017-12-29 DIAGNOSIS — R0782 Intercostal pain: Secondary | ICD-10-CM | POA: Diagnosis not present

## 2017-12-29 DIAGNOSIS — Z0000 Encounter for general adult medical examination without abnormal findings: Secondary | ICD-10-CM | POA: Diagnosis not present

## 2017-12-29 DIAGNOSIS — D509 Iron deficiency anemia, unspecified: Secondary | ICD-10-CM | POA: Diagnosis not present

## 2017-12-29 DIAGNOSIS — Z6835 Body mass index (BMI) 35.0-35.9, adult: Secondary | ICD-10-CM | POA: Diagnosis not present

## 2017-12-29 DIAGNOSIS — J069 Acute upper respiratory infection, unspecified: Secondary | ICD-10-CM | POA: Diagnosis not present

## 2017-12-29 DIAGNOSIS — R05 Cough: Secondary | ICD-10-CM | POA: Diagnosis not present

## 2017-12-31 ENCOUNTER — Ambulatory Visit (HOSPITAL_COMMUNITY)
Admission: RE | Admit: 2017-12-31 | Discharge: 2017-12-31 | Disposition: A | Discharge status: Home / Self Care | Payer: Medicare HMO | Source: Ambulatory Visit | Attending: Internal Medicine | Admitting: Internal Medicine

## 2017-12-31 DIAGNOSIS — Z1231 Encounter for screening mammogram for malignant neoplasm of breast: Secondary | ICD-10-CM | POA: Insufficient documentation

## 2018-01-05 DIAGNOSIS — E1169 Type 2 diabetes mellitus with other specified complication: Secondary | ICD-10-CM | POA: Diagnosis not present

## 2018-01-05 DIAGNOSIS — E782 Mixed hyperlipidemia: Secondary | ICD-10-CM | POA: Diagnosis not present

## 2018-01-05 DIAGNOSIS — I10 Essential (primary) hypertension: Secondary | ICD-10-CM | POA: Diagnosis not present

## 2018-02-06 DIAGNOSIS — E119 Type 2 diabetes mellitus without complications: Secondary | ICD-10-CM | POA: Diagnosis not present

## 2018-02-15 ENCOUNTER — Other Ambulatory Visit (HOSPITAL_COMMUNITY): Payer: Self-pay | Admitting: Internal Medicine

## 2018-02-15 DIAGNOSIS — R55 Syncope and collapse: Secondary | ICD-10-CM | POA: Diagnosis not present

## 2018-02-16 ENCOUNTER — Ambulatory Visit (HOSPITAL_COMMUNITY)
Admission: RE | Admit: 2018-02-16 | Discharge: 2018-02-16 | Disposition: A | Discharge status: Home / Self Care | Payer: Medicare HMO | Source: Ambulatory Visit | Attending: Internal Medicine | Admitting: Internal Medicine

## 2018-02-16 DIAGNOSIS — R55 Syncope and collapse: Secondary | ICD-10-CM | POA: Insufficient documentation

## 2018-02-16 DIAGNOSIS — R569 Unspecified convulsions: Secondary | ICD-10-CM | POA: Diagnosis not present

## 2018-03-04 ENCOUNTER — Other Ambulatory Visit (HOSPITAL_COMMUNITY): Payer: Self-pay | Admitting: Adult Health Nurse Practitioner

## 2018-03-04 ENCOUNTER — Other Ambulatory Visit: Payer: Self-pay | Admitting: Internal Medicine

## 2018-03-04 ENCOUNTER — Other Ambulatory Visit: Payer: Self-pay | Admitting: Adult Health Nurse Practitioner

## 2018-03-04 DIAGNOSIS — R55 Syncope and collapse: Secondary | ICD-10-CM

## 2018-03-07 DIAGNOSIS — E785 Hyperlipidemia, unspecified: Secondary | ICD-10-CM | POA: Diagnosis not present

## 2018-03-07 DIAGNOSIS — M79672 Pain in left foot: Secondary | ICD-10-CM | POA: Diagnosis not present

## 2018-03-07 DIAGNOSIS — R569 Unspecified convulsions: Secondary | ICD-10-CM | POA: Diagnosis not present

## 2018-03-07 DIAGNOSIS — E1140 Type 2 diabetes mellitus with diabetic neuropathy, unspecified: Secondary | ICD-10-CM | POA: Diagnosis not present

## 2018-03-07 DIAGNOSIS — L110 Acquired keratosis follicularis: Secondary | ICD-10-CM | POA: Diagnosis not present

## 2018-03-07 DIAGNOSIS — E119 Type 2 diabetes mellitus without complications: Secondary | ICD-10-CM | POA: Diagnosis not present

## 2018-03-07 DIAGNOSIS — I739 Peripheral vascular disease, unspecified: Secondary | ICD-10-CM | POA: Diagnosis not present

## 2018-03-07 DIAGNOSIS — M79671 Pain in right foot: Secondary | ICD-10-CM | POA: Diagnosis not present

## 2018-03-07 DIAGNOSIS — I679 Cerebrovascular disease, unspecified: Secondary | ICD-10-CM | POA: Diagnosis not present

## 2018-03-08 ENCOUNTER — Other Ambulatory Visit: Payer: Self-pay | Admitting: Neurology

## 2018-03-08 ENCOUNTER — Other Ambulatory Visit (HOSPITAL_COMMUNITY): Payer: Self-pay | Admitting: Neurology

## 2018-03-08 DIAGNOSIS — I679 Cerebrovascular disease, unspecified: Secondary | ICD-10-CM

## 2018-03-08 DIAGNOSIS — E119 Type 2 diabetes mellitus without complications: Secondary | ICD-10-CM | POA: Diagnosis not present

## 2018-03-08 DIAGNOSIS — E785 Hyperlipidemia, unspecified: Secondary | ICD-10-CM | POA: Diagnosis not present

## 2018-03-08 DIAGNOSIS — R000 Tachycardia, unspecified: Secondary | ICD-10-CM | POA: Diagnosis not present

## 2018-03-08 DIAGNOSIS — I10 Essential (primary) hypertension: Secondary | ICD-10-CM | POA: Diagnosis not present

## 2018-03-09 ENCOUNTER — Ambulatory Visit (HOSPITAL_COMMUNITY)
Admission: RE | Admit: 2018-03-09 | Discharge: 2018-03-09 | Disposition: A | Discharge status: Home / Self Care | Payer: Medicare HMO | Source: Ambulatory Visit | Attending: Adult Health Nurse Practitioner | Admitting: Adult Health Nurse Practitioner

## 2018-03-09 DIAGNOSIS — R55 Syncope and collapse: Secondary | ICD-10-CM | POA: Diagnosis not present

## 2018-03-09 DIAGNOSIS — R4020 Unspecified coma: Secondary | ICD-10-CM | POA: Diagnosis not present

## 2018-03-09 DIAGNOSIS — R569 Unspecified convulsions: Secondary | ICD-10-CM | POA: Diagnosis not present

## 2018-03-09 LAB — POCT I-STAT CREATININE: Creatinine, Ser: 1.3 mg/dL — ABNORMAL HIGH (ref 0.44–1.00)

## 2018-03-09 MED ORDER — GADOBUTROL 1 MMOL/ML IV SOLN
10.0000 mL | Freq: Once | INTRAVENOUS | Status: AC | PRN
  Administered 2018-03-09: 10 mL via INTRAVENOUS

## 2018-03-10 DIAGNOSIS — E119 Type 2 diabetes mellitus without complications: Secondary | ICD-10-CM | POA: Diagnosis not present

## 2018-03-11 DIAGNOSIS — R569 Unspecified convulsions: Secondary | ICD-10-CM | POA: Diagnosis not present

## 2018-03-14 ENCOUNTER — Ambulatory Visit (HOSPITAL_COMMUNITY)
Admission: RE | Admit: 2018-03-14 | Discharge: 2018-03-14 | Disposition: A | Discharge status: Home / Self Care | Payer: Medicare HMO | Source: Ambulatory Visit | Attending: Neurology | Admitting: Neurology

## 2018-03-14 DIAGNOSIS — L600 Ingrowing nail: Secondary | ICD-10-CM | POA: Diagnosis not present

## 2018-03-14 DIAGNOSIS — I6523 Occlusion and stenosis of bilateral carotid arteries: Secondary | ICD-10-CM | POA: Diagnosis not present

## 2018-03-14 DIAGNOSIS — I679 Cerebrovascular disease, unspecified: Secondary | ICD-10-CM | POA: Diagnosis not present

## 2018-03-14 DIAGNOSIS — M79672 Pain in left foot: Secondary | ICD-10-CM | POA: Diagnosis not present

## 2018-03-14 DIAGNOSIS — M79671 Pain in right foot: Secondary | ICD-10-CM | POA: Diagnosis not present

## 2018-03-14 DIAGNOSIS — M779 Enthesopathy, unspecified: Secondary | ICD-10-CM | POA: Diagnosis not present

## 2018-04-18 DIAGNOSIS — E785 Hyperlipidemia, unspecified: Secondary | ICD-10-CM | POA: Diagnosis not present

## 2018-04-18 DIAGNOSIS — E1140 Type 2 diabetes mellitus with diabetic neuropathy, unspecified: Secondary | ICD-10-CM | POA: Diagnosis not present

## 2018-04-18 DIAGNOSIS — I679 Cerebrovascular disease, unspecified: Secondary | ICD-10-CM | POA: Diagnosis not present

## 2018-04-18 DIAGNOSIS — R569 Unspecified convulsions: Secondary | ICD-10-CM | POA: Diagnosis not present

## 2018-04-21 DIAGNOSIS — E1165 Type 2 diabetes mellitus with hyperglycemia: Secondary | ICD-10-CM | POA: Diagnosis not present

## 2018-04-21 DIAGNOSIS — R000 Tachycardia, unspecified: Secondary | ICD-10-CM | POA: Diagnosis not present

## 2018-04-21 DIAGNOSIS — D509 Iron deficiency anemia, unspecified: Secondary | ICD-10-CM | POA: Diagnosis not present

## 2018-04-21 DIAGNOSIS — E782 Mixed hyperlipidemia: Secondary | ICD-10-CM | POA: Diagnosis not present

## 2018-04-21 DIAGNOSIS — E785 Hyperlipidemia, unspecified: Secondary | ICD-10-CM | POA: Diagnosis not present

## 2018-04-21 DIAGNOSIS — I10 Essential (primary) hypertension: Secondary | ICD-10-CM | POA: Diagnosis not present

## 2018-04-21 DIAGNOSIS — E119 Type 2 diabetes mellitus without complications: Secondary | ICD-10-CM | POA: Diagnosis not present

## 2018-04-27 DIAGNOSIS — L989 Disorder of the skin and subcutaneous tissue, unspecified: Secondary | ICD-10-CM | POA: Diagnosis not present

## 2018-04-27 DIAGNOSIS — E875 Hyperkalemia: Secondary | ICD-10-CM | POA: Diagnosis not present

## 2018-04-27 DIAGNOSIS — E782 Mixed hyperlipidemia: Secondary | ICD-10-CM | POA: Diagnosis not present

## 2018-04-27 DIAGNOSIS — Z8669 Personal history of other diseases of the nervous system and sense organs: Secondary | ICD-10-CM | POA: Diagnosis not present

## 2018-04-27 DIAGNOSIS — E1169 Type 2 diabetes mellitus with other specified complication: Secondary | ICD-10-CM | POA: Diagnosis not present

## 2018-04-27 DIAGNOSIS — N3281 Overactive bladder: Secondary | ICD-10-CM | POA: Diagnosis not present

## 2018-04-27 DIAGNOSIS — Z8673 Personal history of transient ischemic attack (TIA), and cerebral infarction without residual deficits: Secondary | ICD-10-CM | POA: Diagnosis not present

## 2018-04-27 DIAGNOSIS — I10 Essential (primary) hypertension: Secondary | ICD-10-CM | POA: Diagnosis not present

## 2018-05-23 DIAGNOSIS — M79671 Pain in right foot: Secondary | ICD-10-CM | POA: Diagnosis not present

## 2018-05-23 DIAGNOSIS — M79672 Pain in left foot: Secondary | ICD-10-CM | POA: Diagnosis not present

## 2018-05-23 DIAGNOSIS — E1140 Type 2 diabetes mellitus with diabetic neuropathy, unspecified: Secondary | ICD-10-CM | POA: Diagnosis not present

## 2018-05-23 DIAGNOSIS — L110 Acquired keratosis follicularis: Secondary | ICD-10-CM | POA: Diagnosis not present

## 2018-05-25 DIAGNOSIS — Z8673 Personal history of transient ischemic attack (TIA), and cerebral infarction without residual deficits: Secondary | ICD-10-CM | POA: Diagnosis not present

## 2018-05-25 DIAGNOSIS — E1169 Type 2 diabetes mellitus with other specified complication: Secondary | ICD-10-CM | POA: Diagnosis not present

## 2018-05-25 DIAGNOSIS — I10 Essential (primary) hypertension: Secondary | ICD-10-CM | POA: Diagnosis not present

## 2018-05-25 DIAGNOSIS — E782 Mixed hyperlipidemia: Secondary | ICD-10-CM | POA: Diagnosis not present

## 2018-06-03 DIAGNOSIS — E782 Mixed hyperlipidemia: Secondary | ICD-10-CM | POA: Diagnosis not present

## 2018-06-03 DIAGNOSIS — Z8673 Personal history of transient ischemic attack (TIA), and cerebral infarction without residual deficits: Secondary | ICD-10-CM | POA: Diagnosis not present

## 2018-06-03 DIAGNOSIS — E1169 Type 2 diabetes mellitus with other specified complication: Secondary | ICD-10-CM | POA: Diagnosis not present

## 2018-06-03 DIAGNOSIS — I10 Essential (primary) hypertension: Secondary | ICD-10-CM | POA: Diagnosis not present

## 2018-06-08 DIAGNOSIS — E119 Type 2 diabetes mellitus without complications: Secondary | ICD-10-CM | POA: Diagnosis not present

## 2018-06-21 DIAGNOSIS — Z0000 Encounter for general adult medical examination without abnormal findings: Secondary | ICD-10-CM | POA: Diagnosis not present

## 2018-07-11 DIAGNOSIS — R55 Syncope and collapse: Secondary | ICD-10-CM | POA: Diagnosis not present

## 2018-07-11 DIAGNOSIS — R51 Headache: Secondary | ICD-10-CM | POA: Diagnosis not present

## 2018-07-11 DIAGNOSIS — E1169 Type 2 diabetes mellitus with other specified complication: Secondary | ICD-10-CM | POA: Diagnosis not present

## 2018-07-11 DIAGNOSIS — I10 Essential (primary) hypertension: Secondary | ICD-10-CM | POA: Diagnosis not present

## 2018-07-11 DIAGNOSIS — Z8673 Personal history of transient ischemic attack (TIA), and cerebral infarction without residual deficits: Secondary | ICD-10-CM | POA: Diagnosis not present

## 2018-07-11 DIAGNOSIS — E1340 Other specified diabetes mellitus with diabetic neuropathy, unspecified: Secondary | ICD-10-CM | POA: Diagnosis not present

## 2018-07-12 ENCOUNTER — Telehealth: Payer: Self-pay | Admitting: *Deleted

## 2018-07-12 ENCOUNTER — Encounter: Payer: Self-pay | Admitting: *Deleted

## 2018-07-12 NOTE — Telephone Encounter (Signed)
Spoke with patient and updated EMR. 

## 2018-07-13 ENCOUNTER — Encounter: Payer: Self-pay | Admitting: Diagnostic Neuroimaging

## 2018-07-13 ENCOUNTER — Ambulatory Visit (INDEPENDENT_AMBULATORY_CARE_PROVIDER_SITE_OTHER): Payer: Medicare HMO | Admitting: Diagnostic Neuroimaging

## 2018-07-13 ENCOUNTER — Other Ambulatory Visit: Payer: Self-pay

## 2018-07-13 DIAGNOSIS — G40909 Epilepsy, unspecified, not intractable, without status epilepticus: Secondary | ICD-10-CM

## 2018-07-13 MED ORDER — LEVETIRACETAM 500 MG PO TABS: 500.0000 mg | ORAL_TABLET | Freq: Two times a day (BID) | ORAL | 12 refills | Status: DC

## 2018-07-13 NOTE — Progress Notes (Signed)
[facility]  PATIENT: Maria Esparza DOB: 12/13/1945  REFERRING CLINICIAN: [place] Crane, J HISTORY FROM: patient REASON FOR VISIT: new consult    HISTORICAL  CHIEF COMPLAINT:  Chief Complaint  Patient presents with  . Seizures    HISTORY OF PRESENT ILLNESS:   73 year old female with hypertension, diabetes, hypercholesterolemia, here for evaluation of seizure.  December 2019 patient had episode of possibly back, color change in the face, shaking and convulsions.  No tongue biting or incontinence.  Episode lasted for 30 to 60 seconds.  Patient had evaluated by neurologist who ordered EEG and MRI.  Currently EEG was unremarkable.  MRI of the brain showed moderate to severe chronic small vessel ischemic disease and moderate atrophy.  Possibility of seizure versus TIA was raised.  Patient did well until April 2020 when she had her second event, similar seizure-like activity.  This time patient was somewhat aware during the event, felt herself going into the shaking.  He was somewhat confused.  Patient had a 3rd event May 2020, also a similar type.  No prior history of seizures.  Patient did have history of head trauma in 2005 requiring multiple sutures in the scalp.   REVIEW OF SYSTEMS: Full 14 system review of systems performed and negative with exception of: As per HPI.  ALLERGIES: Allergies  Allergen Reactions  . Penicillins Other (See Comments)    Vomiting, ge 10 Can take cephal   . Prinivil [Lisinopril] Cough    HOME MEDICATIONS: Outpatient Medications Prior to Visit  Medication Sig Dispense Refill  . aspirin EC 81 MG tablet Take 81 mg by mouth daily.      . calcium carbonate (OS-CAL) 600 MG TABS Take 600 mg by mouth 2 (two) times daily before a meal. With Vitamin D     . diclofenac (VOLTAREN) 75 MG EC tablet Take 75 mg by mouth 2 (two) times daily.    . Ferrous Sulfate (IRON PO) Take 65 mg by mouth daily.    . fish oil-omega-3 fatty acids 1000 MG  capsule Take 1 g by mouth daily.      Marland Kitchen gabapentin (NEURONTIN) 100 MG capsule TK 1 C PO TID  2  . Insulin Degludec (TRESIBA) 100 UNIT/ML SOLN Inject 36 Units into the skin.    Marland Kitchen losartan (COZAAR) 50 MG tablet TAKE 1 TABLET(50 MG) BY MOUTH DAILY (Patient not taking: Reported on 07/12/2018) 90 tablet 1  . metFORMIN (GLUCOPHAGE) 500 MG tablet 1 TABLET BY MOUTH TWICE DAILY WITH MEALS 60 tablet 5  . Multiple Vitamins-Minerals (CENTRUM SILVER PO) Take by mouth.    . naproxen (NAPROSYN) 500 MG tablet TAKE 1 TABLET(500 MG) BY MOUTH TWICE DAILY WITH A MEAL (Patient not taking: Reported on 07/12/2018) 60 tablet 5  . oxybutynin (DITROPAN) 5 MG tablet Take 5 mg by mouth 3 (three) times daily.    . pioglitazone (ACTOS) 30 MG tablet Take 30 mg by mouth daily.    . pravastatin (PRAVACHOL) 20 MG tablet Take 1 tablet (20 mg total) by mouth daily. 90 tablet 1   No facility-administered medications prior to visit.     PAST MEDICAL HISTORY: Past Medical History:  Diagnosis Date  . Anemia   . Arthritis   . Asthma    mild  . Cough   . Diabetes mellitus type 2, uncomplicated (HCC)    x 10 yrs  . Diabetic neuropathy ([place]) rt foot  . Headache(784.0)   . Hypercholesteremia   . Hyperlipidemia   . Hypertension   .  Hypertriglyceridemia 2002  . IBS (irritable bowel syndrome)     PAST SURGICAL HISTORY: Past Surgical History:  Procedure Laterality Date  . APPENDECTOMY    . BREAST SURGERY  1999   left breast biopsy  . CERVICAL SPINE SURGERY  december 2003  . [place]   for unknown reason.  They thought she had a mass.  . COLONOSCOPY  12/04/2010   Procedure: COLONOSCOPY;  Surgeon: Rogene Houston, MD;  [facility];  Service: Endoscopy;  Laterality: N/A;  3:00  . COLONOSCOPY N/A 01/18/2015   Procedure: COLONOSCOPY;  Surgeon: Rogene Houston, MD;  [facility];  Service: Endoscopy;  Laterality: N/A;  955 - moved to 10:40 - Ann notified pt    FAMILY HISTORY: Family History   Adopted: Yes    SOCIAL HISTORY: Social History   Socioeconomic History  . Marital status: Married    Spouse name: Not on file  . Number of children: Not on file  . Years of education: Not on file  . Highest education level: High school graduate  Occupational History    Comment: retired  Scientific laboratory technician  . Financial resource strain: Not on file  . Food insecurity:    Worry: Not on file    Inability: Not on file  . Transportation needs:    Medical: Not on file    Non-medical: Not on file  Tobacco Use  . Smoking status: Former Smoker    Packs/day: 3.00    Years: 20.00    Pack years: 60.00  . Smokeless tobacco: Never Used  . Tobacco comment: Quit smoking in early 80s after smoking 20 yrs,.  Substance and Sexual Activity  . Alcohol use: Yes    Alcohol/week: 0.0 standard drinks    Comment: very rare occasion on a holiday  . Drug use: No  . Sexual activity: Not on file  Lifestyle  . Physical activity:    Days per week: Not on file    Minutes per session: Not on file  . Stress: Not on file  Relationships  . Social connections:    Talks on phone: Not on file    Gets together: Not on file    Attends religious service: Not on file    Active member of club or organization: Not on file    Attends meetings of clubs or organizations: Not on file    Relationship status: Not on file  . Intimate partner violence:    Fear of current or ex partner: Not on file    Emotionally abused: Not on file    Physically abused: Not on file    Forced sexual activity: Not on file  Other Topics Concern  . Not on file  Social History Narrative   Lives with spouse   Caffeine- coffee 4-5 day     PHYSICAL EXAM  VIDEO EXAM  GENERAL EXAM/CONSTITUTIONAL:  Vitals: There were no vitals filed for this visit.  There is no height or weight on file to calculate BMI. Wt Readings from Last 3 Encounters:  01/09/16 224 lb 6 oz (101.8 kg)  01/01/16 224 lb 4 oz (101.7 kg)  07/04/15 219 lb 9.6 oz  (99.6 kg)     Patient is in no distress; well developed, nourished and groomed; neck is supple   NEUROLOGIC: MENTAL STATUS:  No flowsheet data found.  awake, alert, oriented to person, place and time  recent and remote memory intact  normal attention and concentration  language fluent,  comprehension intact, naming intact  fund of knowledge appropriate  CRANIAL NERVE:   2nd, 3rd, 4th, 6th - visual fields full to confrontation, extraocular muscles intact, no nystagmus  5th - facial sensation symmetric  7th - facial strength symmetric  8th - hearing intact  11th - shoulder shrug symmetric  12th - tongue protrusion midline  MOTOR:   NO TREMOR; NO DRIFT IN BUE  SENSORY:   normal and symmetric to light touch  COORDINATION:   fine finger movements normal      DIAGNOSTIC DATA (LABS, IMAGING, TESTING) - I reviewed patient records, labs, notes, testing and imaging myself where available.  Lab Results  Component Value Date   HGB 13.7 04/26/2015      Component Value Date/Time   NA 137 01/06/2016 0814   K 5.0 01/06/2016 0814   CL 96 01/06/2016 0814   CO2 26 01/06/2016 0814   GLUCOSE 240 (H) 01/06/2016 0814   GLUCOSE 135 (H) 01/17/2014 1001   BUN 12 01/06/2016 0814   CREATININE 1.30 (H) 03/09/2018 1530   CREATININE 0.80 01/17/2014 1001   CALCIUM 9.1 01/06/2016 0814   PROT 6.5 01/06/2016 0814   ALBUMIN 4.1 01/06/2016 0814   AST 59 (H) 01/06/2016 0814   ALT 68 (H) 01/06/2016 0814   ALKPHOS 76 01/06/2016 0814   BILITOT 0.3 01/06/2016 0814   GFRNONAA 76 01/06/2016 0814   GFRAA 87 01/06/2016 0814   Lab Results  Component Value Date   CHOL 231 (H) 01/06/2016   HDL 53 01/06/2016   LDLCALC 108 (H) 01/06/2016   TRIG 352 (H) 01/06/2016   CHOLHDL 4.4 01/06/2016   Lab Results  Component Value Date   HGBA1C 8.1 01/01/2016   No results found for: VITAMINB12 No results found for: TSH  03/09/18 MRI brain 1. No acute intracranial process or structural  cause of seizure identified. 2. Moderate to severe chronic microvascular ischemic changes and moderate volume loss of the brain.    ASSESSMENT AND PLAN  73 y.o. year old female here with   Dx:  1. Seizure disorder [facility])     Virtual Visit via Video Note  I connected with Maria Esparza on 07/13/18 at  2:00 PM EDT by a video enabled telemedicine application and verified that I am speaking with the correct person using two identifiers.  Location: Patient: home  Provider: office   I discussed the limitations of evaluation and management by telemedicine and the availability of in person appointments. The patient expressed understanding and agreed to proceed.  I discussed the assessment and treatment plan with the patient. The patient was provided an opportunity to ask questions and all were answered. The patient agreed with the plan and demonstrated an understanding of the instructions.   The patient was advised to call back or seek an in-person evaluation if the symptoms worsen or if the condition fails to improve as anticipated.  I provided 25 minutes of non-face-to-face time during this encounter.   PLAN:  SEIZURE DISORDER (last event Jul 11, 2018) - start levetiracetam 500mg  twice a day  - According to [facility] law, you can not drive unless you are seizure / syncope free for at least 6 months and under physician's care.   - Please maintain precautions. Do not participate in activities where a loss of awareness could harm you or someone else. No swimming alone, no tub bathing, no hot tubs, no driving, no operating motorized vehicles (cars, ATVs, motocycles, etc), lawnmowers, power tools or firearms. No standing at  heights, such as rooftops, ladders or stairs. Avoid hot objects such as stoves, heaters, open fires. Wear a helmet when riding a bicycle, scooter, skateboard, etc. and avoid areas of traffic. Set your water heater to 120 degrees or less.   Meds ordered this encounter   Medications  . levETIRAcetam (KEPPRA) 500 MG tablet    Sig: Take 1 tablet (500 mg total) by mouth 2 (two) times daily.    Dispense:  60 tablet    Refill:  12   Return in about 4 months (around 11/13/2018).    Penni Bombard, MD 0/17/5102, 5:85 PM Certified in Neurology, Neurophysiology and Neuroimaging  [facility] [address], [place],  [address] [phone]

## 2018-08-02 DIAGNOSIS — D509 Iron deficiency anemia, unspecified: Secondary | ICD-10-CM | POA: Diagnosis not present

## 2018-08-02 DIAGNOSIS — E785 Hyperlipidemia, unspecified: Secondary | ICD-10-CM | POA: Diagnosis not present

## 2018-08-02 DIAGNOSIS — I10 Essential (primary) hypertension: Secondary | ICD-10-CM | POA: Diagnosis not present

## 2018-08-02 DIAGNOSIS — E782 Mixed hyperlipidemia: Secondary | ICD-10-CM | POA: Diagnosis not present

## 2018-08-02 DIAGNOSIS — E1169 Type 2 diabetes mellitus with other specified complication: Secondary | ICD-10-CM | POA: Diagnosis not present

## 2018-08-02 DIAGNOSIS — E1165 Type 2 diabetes mellitus with hyperglycemia: Secondary | ICD-10-CM | POA: Diagnosis not present

## 2018-08-04 DIAGNOSIS — M25572 Pain in left ankle and joints of left foot: Secondary | ICD-10-CM | POA: Diagnosis not present

## 2018-08-04 DIAGNOSIS — E875 Hyperkalemia: Secondary | ICD-10-CM | POA: Diagnosis not present

## 2018-08-04 DIAGNOSIS — N3281 Overactive bladder: Secondary | ICD-10-CM | POA: Diagnosis not present

## 2018-08-04 DIAGNOSIS — E1169 Type 2 diabetes mellitus with other specified complication: Secondary | ICD-10-CM | POA: Diagnosis not present

## 2018-08-04 DIAGNOSIS — L989 Disorder of the skin and subcutaneous tissue, unspecified: Secondary | ICD-10-CM | POA: Diagnosis not present

## 2018-08-04 DIAGNOSIS — E782 Mixed hyperlipidemia: Secondary | ICD-10-CM | POA: Diagnosis not present

## 2018-08-04 DIAGNOSIS — I10 Essential (primary) hypertension: Secondary | ICD-10-CM | POA: Diagnosis not present

## 2018-08-04 DIAGNOSIS — G4089 Other seizures: Secondary | ICD-10-CM | POA: Diagnosis not present

## 2018-08-04 DIAGNOSIS — Z8673 Personal history of transient ischemic attack (TIA), and cerebral infarction without residual deficits: Secondary | ICD-10-CM | POA: Diagnosis not present

## 2018-08-15 DIAGNOSIS — E1140 Type 2 diabetes mellitus with diabetic neuropathy, unspecified: Secondary | ICD-10-CM | POA: Diagnosis not present

## 2018-08-15 DIAGNOSIS — L110 Acquired keratosis follicularis: Secondary | ICD-10-CM | POA: Diagnosis not present

## 2018-08-15 DIAGNOSIS — M79671 Pain in right foot: Secondary | ICD-10-CM | POA: Diagnosis not present

## 2018-08-15 DIAGNOSIS — M79672 Pain in left foot: Secondary | ICD-10-CM | POA: Diagnosis not present

## 2018-09-12 DIAGNOSIS — G4089 Other seizures: Secondary | ICD-10-CM | POA: Diagnosis not present

## 2018-09-12 DIAGNOSIS — G9009 Other idiopathic peripheral autonomic neuropathy: Secondary | ICD-10-CM | POA: Diagnosis not present

## 2018-09-13 DIAGNOSIS — E119 Type 2 diabetes mellitus without complications: Secondary | ICD-10-CM | POA: Diagnosis not present

## 2018-09-14 DIAGNOSIS — Z8673 Personal history of transient ischemic attack (TIA), and cerebral infarction without residual deficits: Secondary | ICD-10-CM | POA: Diagnosis not present

## 2018-09-14 DIAGNOSIS — I10 Essential (primary) hypertension: Secondary | ICD-10-CM | POA: Diagnosis not present

## 2018-09-14 DIAGNOSIS — E782 Mixed hyperlipidemia: Secondary | ICD-10-CM | POA: Diagnosis not present

## 2018-09-14 DIAGNOSIS — E1169 Type 2 diabetes mellitus with other specified complication: Secondary | ICD-10-CM | POA: Diagnosis not present

## 2018-10-04 ENCOUNTER — Other Ambulatory Visit: Payer: Self-pay

## 2018-10-04 ENCOUNTER — Ambulatory Visit: Payer: Medicare HMO | Admitting: Diagnostic Neuroimaging

## 2018-10-04 ENCOUNTER — Encounter: Payer: Self-pay | Admitting: Diagnostic Neuroimaging

## 2018-10-04 VITALS — BP 158/81 | HR 87 | Temp 97.3°F | Ht 68.5 in | Wt 250.2 lb

## 2018-10-04 DIAGNOSIS — M545 Low back pain, unspecified: Secondary | ICD-10-CM

## 2018-10-04 DIAGNOSIS — G8929 Other chronic pain: Secondary | ICD-10-CM

## 2018-10-04 DIAGNOSIS — G629 Polyneuropathy, unspecified: Secondary | ICD-10-CM | POA: Diagnosis not present

## 2018-10-04 DIAGNOSIS — G40909 Epilepsy, unspecified, not intractable, without status epilepticus: Secondary | ICD-10-CM

## 2018-10-04 MED ORDER — GABAPENTIN 300 MG PO CAPS: 300.0000 mg | ORAL_CAPSULE | Freq: Three times a day (TID) | ORAL | 6 refills | Status: DC

## 2018-10-04 NOTE — Patient Instructions (Signed)
  NUMBNESS IN FEET (diabetic neuropathy vs lumbar radiculopathy) - start gabapentin 300mg  at bedtime; increase tup to 300-600mg  three times a day  - refer to PT evaluation - may consider MRI lumbar spine in future  SEIZURE DISORDER (last event Jul 11, 2018) - continue levetiracetam 500mg  twice a day - According to [facility] law, you can not drive unless you are seizure / syncope free for at least 6 months and under physician's care.  - Please maintain precautions. Do not participate in activities where a loss of awareness could harm you or someone else. No swimming alone, no tub bathing, no hot tubs, no driving, no operating motorized vehicles (cars, ATVs, motocycles, etc), lawnmowers, power tools or firearms. No standing at heights, such as rooftops, ladders or stairs. Avoid hot objects such as stoves, heaters, open fires. Wear a helmet when riding a bicycle, scooter, skateboard, etc. and avoid areas of traffic. Set your water heater to 120 degrees or less.

## 2018-10-04 NOTE — Progress Notes (Signed)
[facility]  PATIENT: [place] DOB: 02-28-1946  REFERRING CLINICIAN: [place] Crane, J HISTORY FROM: patient REASON FOR VISIT: new consult / established patient   HISTORICAL  CHIEF COMPLAINT:  Chief Complaint  Patient presents with  . Numbness    rm 6, husband- [name], "numbness in feet and toes"    HISTORY OF PRESENT ILLNESS:   UPDATE (10/04/18, VRP): Since last visit, doing here for evaluation of numbness and tingling in the feet.  Patient has had this problem for past 3 years.  Patient has diabetes which is under control with medication.  She also has some low back pain without radiating symptoms.  Here for evaluation of neuropathy versus lumbar radiculopathy.  PRIOR HPI (07/06/18): 73 year old female with hypertension, diabetes, hypercholesterolemia, here for evaluation of seizure.  December 2019 patient had episode of possibly back, color change in the face, shaking and convulsions.  No tongue biting or incontinence.  Episode lasted for 30 to 60 seconds.  Patient had evaluated by neurologist who ordered EEG and MRI.  Currently EEG was unremarkable.  MRI of the brain showed moderate to severe chronic small vessel ischemic disease and moderate atrophy.  Possibility of seizure versus TIA was raised.  Patient did well until April 2020 when she had her second event, similar seizure-like activity.  This time patient was somewhat aware during the event, felt herself going into the shaking.  He was somewhat confused.  Patient had a 3rd event May 2020, also a similar type.  No prior history of seizures.  Patient did have history of head trauma in 2005 requiring multiple sutures in the scalp.   REVIEW OF SYSTEMS: Full 14 system review of systems performed and negative with exception of: As per HPI.  ALLERGIES: Allergies  Allergen Reactions  . Penicillins Other (See Comments)    Vomiting, ge 10 Can take cephal   . Prinivil [Lisinopril] Cough    HOME  MEDICATIONS: Outpatient Medications Prior to Visit  Medication Sig Dispense Refill  . calcium carbonate (OS-CAL) 600 MG TABS Take 600 mg by mouth 2 (two) times daily before a meal. With Vitamin D     . Ferrous Sulfate (IRON PO) Take 65 mg by mouth daily.    . fish oil-omega-3 fatty acids 1000 MG capsule Take 1 g by mouth daily.      . Insulin Degludec (TRESIBA) 100 UNIT/ML SOLN Inject 36 Units into the skin.    Marland Kitchen levETIRAcetam (KEPPRA) 500 MG tablet Take 1 tablet (500 mg total) by mouth 2 (two) times daily. 60 tablet 12  . metFORMIN (GLUCOPHAGE) 500 MG tablet 1 TABLET BY MOUTH TWICE DAILY WITH MEALS 60 tablet 5  . Multiple Vitamins-Minerals (CENTRUM SILVER PO) Take by mouth.    . oxybutynin (DITROPAN) 5 MG tablet Take 5 mg by mouth 3 (three) times daily.    . pioglitazone (ACTOS) 30 MG tablet Take 30 mg by mouth daily.    . rosuvastatin (CRESTOR) 10 MG tablet Take 10 mg by mouth daily.    Marland Kitchen aspirin EC 81 MG tablet Take 81 mg by mouth daily.      . diclofenac (VOLTAREN) 75 MG EC tablet Take 75 mg by mouth 2 (two) times daily.    Marland Kitchen gabapentin (NEURONTIN) 100 MG capsule TK 1 C PO TID  2  . losartan (COZAAR) 50 MG tablet TAKE 1 TABLET(50 MG) BY MOUTH DAILY (Patient not taking: Reported on 07/12/2018) 90 tablet 1  . naproxen (NAPROSYN) 500 MG tablet TAKE 1 TABLET(500  MG) BY MOUTH TWICE DAILY WITH A MEAL (Patient not taking: Reported on 07/12/2018) 60 tablet 5  . pravastatin (PRAVACHOL) 20 MG tablet Take 1 tablet (20 mg total) by mouth daily. 90 tablet 1   No facility-administered medications prior to visit.     PAST MEDICAL HISTORY: Past Medical History:  Diagnosis Date  . Anemia   . Arthritis   . Asthma    mild  . Cough   . Diabetes mellitus type 2, uncomplicated (HCC)    x 10 yrs  . Diabetic neuropathy ([place]) rt foot  . Headache(784.0)   . Hypercholesteremia   . Hyperlipidemia   . Hypertension   . Hypertriglyceridemia 2002  . IBS (irritable bowel syndrome)     PAST SURGICAL  HISTORY: Past Surgical History:  Procedure Laterality Date  . APPENDECTOMY    . BREAST SURGERY  1999   left breast biopsy  . CERVICAL SPINE SURGERY  december 2003  . [place]   for unknown reason.  They thought she had a mass.  . COLONOSCOPY  12/04/2010   Procedure: COLONOSCOPY;  Surgeon: Rogene Houston, MD;  [facility];  Service: Endoscopy;  Laterality: N/A;  3:00  . COLONOSCOPY N/A 01/18/2015   Procedure: COLONOSCOPY;  Surgeon: Rogene Houston, MD;  [facility];  Service: Endoscopy;  Laterality: N/A;  955 - moved to 10:40 - Ann notified pt    FAMILY HISTORY: Family History  Adopted: Yes    SOCIAL HISTORY: Social History   Socioeconomic History  . Marital status: Married    Spouse name: [name]  . Number of children: Not on file  . Years of education: Not on file  . Highest education level: High school graduate  Occupational History    Comment: retired  Scientific laboratory technician  . Financial resource strain: Not on file  . Food insecurity    Worry: Not on file    Inability: Not on file  . Transportation needs    Medical: Not on file    Non-medical: Not on file  Tobacco Use  . Smoking status: Former Smoker    Packs/day: 3.00    Years: 20.00    Pack years: 60.00  . Smokeless tobacco: Never Used  . Tobacco comment: Quit smoking in early 80s after smoking 20 yrs,.  Substance and Sexual Activity  . Alcohol use: Yes    Alcohol/week: 0.0 standard drinks    Comment: very rare occasion on a holiday  . Drug use: No  . Sexual activity: Not on file  Lifestyle  . Physical activity    Days per week: Not on file    Minutes per session: Not on file  . Stress: Not on file  Relationships  . Social Herbalist on phone: Not on file    Gets together: Not on file    Attends religious service: Not on file    Active member of club or organization: Not on file    Attends meetings of clubs or organizations: Not on file    Relationship status:  Not on file  . Intimate partner violence    Fear of current or ex partner: Not on file    Emotionally abused: Not on file    Physically abused: Not on file    Forced sexual activity: Not on file  Other Topics Concern  . Not on file  Social History Narrative   Lives with spouse   Caffeine- coffee 4-5 day  PHYSICAL EXAM  GENERAL EXAM/CONSTITUTIONAL: Vitals:  Vitals:   10/04/18 1412  BP: (!) 158/81  Pulse: 87  Temp: (!) 97.3 F (36.3 C)  Weight: 250 lb 3.2 oz (113.5 kg)  Height: 5' 8.5" (1.74 m)     Body mass index is 37.49 kg/m. Wt Readings from Last 3 Encounters:  10/04/18 250 lb 3.2 oz (113.5 kg)  01/09/16 224 lb 6 oz (101.8 kg)  01/01/16 224 lb 4 oz (101.7 kg)     Patient is in no distress; well developed, nourished and groomed; neck is supple  CARDIOVASCULAR:  Examination of carotid arteries is normal; no carotid bruits  Regular rate and rhythm, no murmurs  Examination of peripheral vascular system by observation and palpation is normal  EYES:  Ophthalmoscopic exam of optic discs and posterior segments is normal; no papilledema or hemorrhages  No exam data present  MUSCULOSKELETAL:  Gait, strength, tone, movements noted in Neurologic exam below  NEUROLOGIC: MENTAL STATUS:  No flowsheet data found.  awake, alert, oriented to person, place and time  recent and remote memory intact  normal attention and concentration  language fluent, comprehension intact, naming intact  fund of knowledge appropriate  CRANIAL NERVE:   2nd - no papilledema on fundoscopic exam  2nd, 3rd, 4th, 6th - pupils equal and reactive to light, visual fields full to confrontation, extraocular muscles intact, no nystagmus  5th - facial sensation symmetric  7th - facial strength symmetric  8th - hearing intact  9th - palate elevates symmetrically, uvula midline  11th - shoulder shrug symmetric  12th - tongue protrusion midline  MOTOR:   normal bulk and  tone, full strength in the BUE, BLE  SENSORY:   normal and symmetric to light touch; DECR IN FEET  COORDINATION:   finger-nose-finger, fine finger movements normal  REFLEXES:   deep tendon reflexes TRACE and symmetric  GAIT/STATION:   narrow based gait       DIAGNOSTIC DATA (LABS, IMAGING, TESTING) - I reviewed patient records, labs, notes, testing and imaging myself where available.  Lab Results  Component Value Date   HGB 13.7 04/26/2015      Component Value Date/Time   NA 137 01/06/2016 0814   K 5.0 01/06/2016 0814   CL 96 01/06/2016 0814   CO2 26 01/06/2016 0814   GLUCOSE 240 (H) 01/06/2016 0814   GLUCOSE 135 (H) 01/17/2014 1001   BUN 12 01/06/2016 0814   CREATININE 1.30 (H) 03/09/2018 1530   CREATININE 0.80 01/17/2014 1001   CALCIUM 9.1 01/06/2016 0814   PROT 6.5 01/06/2016 0814   ALBUMIN 4.1 01/06/2016 0814   AST 59 (H) 01/06/2016 0814   ALT 68 (H) 01/06/2016 0814   ALKPHOS 76 01/06/2016 0814   BILITOT 0.3 01/06/2016 0814   GFRNONAA 76 01/06/2016 0814   GFRAA 87 01/06/2016 0814   Lab Results  Component Value Date   CHOL 231 (H) 01/06/2016   HDL 53 01/06/2016   LDLCALC 108 (H) 01/06/2016   TRIG 352 (H) 01/06/2016   CHOLHDL 4.4 01/06/2016   Lab Results  Component Value Date   HGBA1C 8.1 01/01/2016   No results found for: VITAMINB12 No results found for: TSH  03/09/18 MRI brain 1. No acute intracranial process or structural cause of seizure identified. 2. Moderate to severe chronic microvascular ischemic changes and moderate volume loss of the brain.    ASSESSMENT AND PLAN  73 y.o. year old female here with   Dx:  1. Neuropathy   2. Seizure  disorder ([place])   3. Chronic bilateral low back pain without sciatica      PLAN:  NUMBNESS IN FEET (diabetic neuropathy vs lumbar radiculopathy) - start gabapentin 300mg  at bedtime; increase tup to 300-600mg  three times a day  - refer to PT evaluation - may consider MRI lumbar spine in future   SEIZURE DISORDER (last event Jul 11, 2018) - continue levetiracetam 500mg  twice a day  - According to [facility] law, you can not drive unless you are seizure / syncope free for at least 6 months and under physician's care.   - Please maintain precautions. Do not participate in activities where a loss of awareness could harm you or someone else. No swimming alone, no tub bathing, no hot tubs, no driving, no operating motorized vehicles (cars, ATVs, motocycles, etc), lawnmowers, power tools or firearms. No standing at heights, such as rooftops, ladders or stairs. Avoid hot objects such as stoves, heaters, open fires. Wear a helmet when riding a bicycle, scooter, skateboard, etc. and avoid areas of traffic. Set your water heater to 120 degrees or less.   Meds ordered this encounter  Medications  . gabapentin (NEURONTIN) 300 MG capsule    Sig: Take 1 capsule (300 mg total) by mouth 3 (three) times daily.    Dispense:  90 capsule    Refill:  6   Orders Placed This Encounter  Procedures  . Ambulatory referral to Physical Therapy   Return in about 6 months (around 04/06/2019).    Penni Bombard, MD 05/03/4354, 8:61 PM Certified in Neurology, Neurophysiology and Neuroimaging  [facility] [address] [place], [place] [address] [phone]

## 2018-10-12 DIAGNOSIS — I10 Essential (primary) hypertension: Secondary | ICD-10-CM | POA: Diagnosis not present

## 2018-10-12 DIAGNOSIS — E1169 Type 2 diabetes mellitus with other specified complication: Secondary | ICD-10-CM | POA: Diagnosis not present

## 2018-10-12 DIAGNOSIS — Z8673 Personal history of transient ischemic attack (TIA), and cerebral infarction without residual deficits: Secondary | ICD-10-CM | POA: Diagnosis not present

## 2018-10-12 DIAGNOSIS — E782 Mixed hyperlipidemia: Secondary | ICD-10-CM | POA: Diagnosis not present

## 2018-10-14 DIAGNOSIS — H6503 Acute serous otitis media, bilateral: Secondary | ICD-10-CM | POA: Diagnosis not present

## 2018-10-14 DIAGNOSIS — H6123 Impacted cerumen, bilateral: Secondary | ICD-10-CM | POA: Diagnosis not present

## 2018-10-14 DIAGNOSIS — R05 Cough: Secondary | ICD-10-CM | POA: Diagnosis not present

## 2018-10-14 DIAGNOSIS — R221 Localized swelling, mass and lump, neck: Secondary | ICD-10-CM | POA: Diagnosis not present

## 2018-10-14 DIAGNOSIS — R197 Diarrhea, unspecified: Secondary | ICD-10-CM | POA: Diagnosis not present

## 2018-10-17 ENCOUNTER — Ambulatory Visit (HOSPITAL_COMMUNITY): Payer: Medicare HMO | Attending: Diagnostic Neuroimaging

## 2018-10-17 ENCOUNTER — Other Ambulatory Visit: Payer: Self-pay | Admitting: Internal Medicine

## 2018-10-17 ENCOUNTER — Encounter (HOSPITAL_COMMUNITY): Payer: Self-pay

## 2018-10-17 ENCOUNTER — Other Ambulatory Visit (HOSPITAL_COMMUNITY): Payer: Self-pay | Admitting: Internal Medicine

## 2018-10-17 ENCOUNTER — Other Ambulatory Visit: Payer: Self-pay

## 2018-10-17 DIAGNOSIS — G8929 Other chronic pain: Secondary | ICD-10-CM | POA: Diagnosis not present

## 2018-10-17 DIAGNOSIS — M6281 Muscle weakness (generalized): Secondary | ICD-10-CM | POA: Diagnosis not present

## 2018-10-17 DIAGNOSIS — M545 Low back pain: Secondary | ICD-10-CM | POA: Insufficient documentation

## 2018-10-17 DIAGNOSIS — R221 Localized swelling, mass and lump, neck: Secondary | ICD-10-CM

## 2018-10-17 NOTE — Therapy (Addendum)
[place], [place], [address] Phone: [phone]   Fax:  [phone]  Physical Therapy Evaluation  Patient Details  Name: Maria Esparza [facility] MRN: [id number] Date of Birth: [birth year] Referring Provider (PT): Penni Bombard, MD   Encounter Date: 10/17/2018  PT End of Session - 10/17/18 1313    Visit Number  1    Number of Visits  8    Date for PT Re-Evaluation  11/14/18    Authorization Type  [employer] HMO    Authorization Time Period  10/17/18 to 11/18/18    Authorization - Visit Number  1    Authorization - Number of Visits  10    PT Start Time  1315   limited secondary to bathroom break   PT Stop Time  1405    PT Time Calculation (min)  50 min    Activity Tolerance  Patient tolerated treatment well    Behavior During Therapy  [facility] for tasks assessed/performed       Past Medical History:  Diagnosis Date  . Anemia   . Arthritis   . Asthma    mild  . Cough   . Diabetes mellitus type 2, uncomplicated (HCC)    x 10 yrs  . Diabetic neuropathy ([place]) rt foot  . Headache(784.0)   . Hypercholesteremia   . Hyperlipidemia   . Hypertension   . Hypertriglyceridemia 2002  . IBS (irritable bowel syndrome)     Past Surgical History:  Procedure Laterality Date  . APPENDECTOMY    . BREAST SURGERY  1999   left breast biopsy  . CERVICAL SPINE SURGERY  december 2003  . [place]   for unknown reason.  They thought she had a mass.  . COLONOSCOPY  12/04/2010   Procedure: COLONOSCOPY;  Surgeon: Rogene Houston, MD;  [facility];  Service: Endoscopy;  Laterality: N/A;  3:00  . COLONOSCOPY N/A 01/18/2015   Procedure: COLONOSCOPY;  Surgeon: Rogene Houston, MD;  [facility];  Service: Endoscopy;  Laterality: N/A;  955 - moved to 10:40 - Ann notified pt    There were no vitals filed for this visit.   Subjective Assessment - 10/17/18 1318    Subjective  Pt reports numbness  throughout all toes, R worse than L, starts at tips of toes and occasionally comes to mid foot on R and stays in toes on L. Pt reports numbness in feet has been going on for a year. Pt reports back pain began about summer 2019, no injury, just progressively got worse and R and L side equally with pain. Pt denies b/b changes or radiation symptoms down BLE. Pt reports prolonged standing, walking, preparing food is difficult due to limited in standing duration and difficulty rolling over in bed occasionally requiring assistance. Pt requires shopping car to hold onto when at grocery store and leaning forward and holding onto furniture when ambulating around home. Pt denies falls, but reports she occasionally gets off balance. Pt reports holding on to objects when reaching to floor to retrieve object to steady self. Pt denies pain is limiting sleep. Pt reports having a SPC, but doesn't use it. Pt reports pain at worst 8-9/10, goes down to 0/10, typically 3-5/10. Pt reports last seizure was May 2020, still unable to drive, no deficits. Pt reports unrelated swelling in R neck region with [place] for further testing this week.    Pertinent History  seizures  Limitations  Standing;Walking    How long can you sit comfortably?  no issues    How long can you stand comfortably?  15 minutes    How long can you walk comfortably?  5-10 minutes    Patient Stated Goals  stop the pain in back    Currently in Pain?  Yes    Pain Score  3     Pain Location  Back    Pain Orientation  Lower    Pain Descriptors / Indicators  Aching    Pain Type  Chronic pain    Pain Onset  More than a month ago    Pain Frequency  Intermittent    Aggravating Factors   prolonged standing, walking    Pain Relieving Factors  sitting, pain medication    Effect of Pain on Daily Activities  limited         [facility] PT Assessment - 10/17/18 0001      Assessment   Medical Diagnosis  chronic LBP w/o sciatica    Referring Provider (PT)  Penni Bombard, MD    Onset Date/Surgical Date  --   about Summer 2019   Next MD Visit  Apr 10, 2019    Prior Therapy  No      Precautions   Precautions  None      Restrictions   Weight Bearing Restrictions  No      Balance Screen   Has the patient fallen in the past 6 months  No    Has the patient had a decrease in activity level because of a fear of falling?   No    Is the patient reluctant to leave their home because of a fear of falling?   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      Cognition   Overall Cognitive Status  Within Functional Limits for tasks assessed      Observation/Other Assessments   Focus on Therapeutic Outcomes (FOTO)   45% limited      Sensation   Light Touch  Impaired by gross assessment    Additional Comments  decreased light touch to bil toes (dorsal and plantar surfaces)      Posture/Postural Control   Posture/Postural Control  Postural limitations    Postural Limitations  Rounded Shoulders;Forward head;Decreased lumbar lordosis      ROM / Strength   AROM / PROM / Strength  AROM;Strength      AROM   AROM Assessment Site  Lumbar    Lumbar Flexion  WNL    Lumbar Extension  75% limited    Lumbar - Right Side Bend  joint line   "twinge on R"   Lumbar - Left Side Bend  joint line   "pain on L"   Lumbar - Right Rotation  WNL  Lumbar - Left Rotation  WNL      Strength   Overall Strength Comments  pt tends to  lean/compensate demonstrating weak core    Strength Assessment Site  Hip;Knee;Ankle    Right Hip Flexion  4-/5    Right Hip Extension  3/5    Right Hip ABduction  3+/5    Left Hip Flexion  4-/5    Left Hip Extension  3/5    Left Hip ABduction  4-/5    Right Knee Flexion  4/5    Right Knee Extension  4+/5    Left Knee Flexion  4/5    Left Knee Extension  4+/5    Right Ankle Dorsiflexion  5/5    Left Ankle Dorsiflexion  5/5      Flexibility   Soft Tissue Assessment /Muscle Length  yes    Hamstrings  90/90:120 deg bil    Quadriceps  +Ely's bil; 90deg knee flexion bil with pain    Piriformis  tightness, palpable restrictions, painful, bilaterally      Palpation   Spinal mobility  normal PA mobility throughout lumbar spine    SI assessment   negative SIJ cluster testing; tenderness to palpation    Palpation comment  tenderness thorughout bil piriformis, glutes; denies tenderness throughout lumbar paraspinals      Special Tests    Special Tests  Lumbar    Lumbar Tests  Slump Test;Straight Leg Raise      Slump test   Findings  Negative    Comment  bil      Straight Leg Raise   Findings  Negative    Comment  bil      Transfers   Transfers  Supine to Sit;Sit to Supine    Supine to Sit  4: Min assist    Supine to Sit Details (indicate cue type and reason)  trunk uprighting assistance    Supine to Sit Details  Tactile cues for sequencing;Verbal cues for sequencing    Sit to Supine  6: Modified independent (Device/Increase time)    Comments  pain in low back with transfer      Balance   Balance Assessed  Yes      Static Standing Balance   Static Standing - Balance Support  No upper extremity supported    Static Standing Balance -  Activities   Single Leg Stance - Right Leg;Single Leg Stance - Left Leg    Static Standing - Comment/# of Minutes  R: <1 sec, L: 1.5 sec or <            Objective measurements completed on examination: See above findings.       PT  Education - 10/17/18 1420    Education Details  Assessment findings, FOTO findings, POC, intiated HEP    Person(s) Educated  Patient    Methods  Explanation;Demonstration;Handout    Comprehension  Verbalized understanding;Returned demonstration       PT Short Term Goals - 10/17/18 1520      PT SHORT TERM GOAL #1   Title  Pt will be independent with HEP, perform consistently and update PRN.    Time  2    Period  Weeks    Status  New    Target Date  10/31/18      PT SHORT TERM GOAL #2   Title  Pt will perform supine<>sit at mod independent without pain and correct sequencing to improve QoL.    Time  2  Period  Weeks    Status  New        PT Long Term Goals - 10/17/18 1520      PT LONG TERM GOAL #1   Title  Pt will ambulate 1.0 m/s or > to demo improved gait mechanics, reduced risk for falls and improve ability to perform hobbies.    Time  4    Period  Weeks    Status  New    Target Date  11/18/18      PT LONG TERM GOAL #2   Title  Pt will improve BLE strength by 1 grade to improve ability to perform functional activities, gait mechanics and overall QoL.    Time  4    Period  Weeks    Status  New      PT LONG TERM GOAL #3   Title  Pt will improve bil SLS time to 30 sec to reduce risk for falls.    Time  4    Period  Weeks    Status  New      PT LONG TERM GOAL #4   Title  Pt will improve 5x STS to 12.6 sec or < to reduce risk for falls with functional activities.    Time  4    Period  Weeks    Status  New      PT LONG TERM GOAL #5   Title  Pt will improve FOTO by 10% to demo improved self perceived limitations with activities.    Time  4    Period  Weeks    Status  New             Plan - 10/17/18 1423    Clinical Impression Statement  Pt is a pleasant 73YO female with bil foot numbness throughout all toes and pain in low back. Pt demonstrates deficits in strength throughout BLE, lumbar AROM, standing balance, gait mechanics and transfers. Pt  demonstrates decreased balance only able to maintain SLS for <1 sec on RLE and 1.5 sec or < on LLE. Functionally, pt requires assistance with supine to sit transfers requiring verbal cues for sequencing and physical assistance to upright trunk. Pt also with limited self-perceived mobility scoring 45% limitations on FOTO. Pt would  benefit form skilled PT to improve strength, balance, gait mechanics, reduce pain and improve overall ability to perform functional activities and improve QoL.    Personal Factors and Comorbidities  Age;Comorbidity 2;Time since onset of injury/illness/exacerbation    Comorbidities  HTN, diabetic neuropathy    Examination-Activity Limitations  Bed Mobility;Bend;Locomotion Level;Stand    Examination-Participation Restrictions  Meal Prep;Volunteer    Stability/Clinical Decision Making  Stable/Uncomplicated    Clinical Decision Making  Low    Rehab Potential  Good    PT Frequency  2x / week    PT Duration  4 weeks    PT Treatment/Interventions  ADLs/Self Care Home Management;Aquatic Therapy;Biofeedback;Cryotherapy;Moist Heat;DME Instruction;Gait training;Stair training;Functional mobility training;Therapeutic activities;Therapeutic exercise;Balance training;Neuromuscular re-education;Patient/family education;Orthotic Fit/Training;Manual techniques;Passive range of motion;Dry needling;Taping;Joint Manipulations    PT Next Visit Plan  Review goals, perform 5x STS, DGI, 6MWT. Begin strengthening for core and BLE hips. Flexiblity for hamstrings, hip flexors, piriformis and low back. Functional strengthening for transfers to improve performance and reduce pain. Manual STM as needed for pain control.    PT Home Exercise Plan  Eval: figure 4 stretch (sitting or supine based on comfort)    Consulted and Agree with Plan of Care  Patient  Patient will benefit from skilled therapeutic intervention in order to improve the following deficits and impairments:  Abnormal gait, Decreased  activity tolerance, Decreased balance, Decreased endurance, Decreased range of motion, Decreased strength, Difficulty walking, Increased fascial restricitons, Increased muscle spasms, Impaired flexibility, Impaired sensation, Improper body mechanics, Pain  Visit Diagnosis: 1. Chronic bilateral low back pain without sciatica   2. Muscle weakness (generalized)        Problem List Patient Active Problem List   Diagnosis Date Noted  . Diabetes mellitus 05/10/2015  . Diabetes ([place]) 05/02/2014  . Diabetic neuropathy ([place]) 05/22/2012  . Diarrhea 03/31/2012  . Hypertension   . Hypercholesteremia   . Hyperlipidemia   . PZZCKICH(798.1Talbot Grumbling PT, DPT 10/17/18, 3:35 PM [place] [address] [place], [place], [address] Phone: [phone]   Fax:  [phone]  Name: Maria Esparza MRN: [id number] Date of Birth: 1945/03/25

## 2018-10-18 DIAGNOSIS — E109 Type 1 diabetes mellitus without complications: Secondary | ICD-10-CM | POA: Diagnosis not present

## 2018-10-18 DIAGNOSIS — H5200 Hypermetropia, unspecified eye: Secondary | ICD-10-CM | POA: Diagnosis not present

## 2018-10-18 DIAGNOSIS — H2513 Age-related nuclear cataract, bilateral: Secondary | ICD-10-CM | POA: Diagnosis not present

## 2018-10-18 DIAGNOSIS — I10 Essential (primary) hypertension: Secondary | ICD-10-CM | POA: Diagnosis not present

## 2018-10-20 ENCOUNTER — Ambulatory Visit (HOSPITAL_COMMUNITY): Payer: Medicare HMO

## 2018-10-20 ENCOUNTER — Encounter (HOSPITAL_COMMUNITY): Payer: Self-pay

## 2018-10-20 ENCOUNTER — Other Ambulatory Visit: Payer: Self-pay

## 2018-10-20 DIAGNOSIS — G8929 Other chronic pain: Secondary | ICD-10-CM

## 2018-10-20 DIAGNOSIS — M545 Low back pain, unspecified: Secondary | ICD-10-CM

## 2018-10-20 DIAGNOSIS — M6281 Muscle weakness (generalized): Secondary | ICD-10-CM | POA: Diagnosis not present

## 2018-10-20 NOTE — Therapy (Signed)
[place], [place], [address] Phone: [phone]   Fax:  [phone]  Physical Therapy Treatment  Patient Details  Name: Maria Esparza [facility] MRN: [id number] Date of Birth: [birth year] Referring Provider (PT): Penni Bombard, MD   Encounter Date: 10/20/2018  PT End of Session - 10/20/18 1324    Visit Number  2    Number of Visits  8    Date for PT Re-Evaluation  11/14/18    Authorization Type  [employer] Medicare HMO    Authorization Time Period  10/17/18 to 11/18/18    Authorization - Visit Number  2    Authorization - Number of Visits  10    PT Start Time  2703    PT Stop Time  1400    PT Time Calculation (min)  43 min    Activity Tolerance  Patient tolerated treatment well    Behavior During Therapy  [facility] for tasks assessed/performed       Past Medical History:  Diagnosis Date  . Anemia   . Arthritis   . Asthma    mild  . Cough   . Diabetes mellitus type 2, uncomplicated (HCC)    x 10 yrs  . Diabetic neuropathy ([place]) rt foot  . Headache(784.0)   . Hypercholesteremia   . Hyperlipidemia   . Hypertension   . Hypertriglyceridemia 2002  . IBS (irritable bowel syndrome)     Past Surgical History:  Procedure Laterality Date  . APPENDECTOMY    . BREAST SURGERY  1999   left breast biopsy  . CERVICAL SPINE SURGERY  december 2003  . [place]   for unknown reason.  They thought she had a mass.  . COLONOSCOPY  12/04/2010   Procedure: COLONOSCOPY;  Surgeon: Rogene Houston, MD;  [facility];  Service: Endoscopy;  Laterality: N/A;  3:00  . COLONOSCOPY N/A 01/18/2015   Procedure: COLONOSCOPY;  Surgeon: Rogene Houston, MD;  [facility];  Service: Endoscopy;  Laterality: N/A;  955 - moved to 10:40 - Ann notified pt    There were no vitals filed for this visit.  Subjective Assessment - 10/20/18 1317    Subjective  Pt reports feeling good today, has been having diarrhea for a  few days now. Pt reports just a little incling of pain just above the buttocks, nothing bad.    Pertinent History  seizures    Limitations  Standing;Walking    How long can you sit comfortably?  no issues    How long can you stand comfortably?  15 minutes    How long can you walk comfortably?  5-10 minutes    Patient Stated Goals  stop the pain in back    Currently in Pain?  No/denies    Pain Onset  More than a month ago         [facility] PT Assessment - 10/20/18 0001      Functional Tests   Functional tests  Sit to Stand      Sit to Stand   Comments  5x STS: 14 sec, from chair, no UE      Ambulation/Gait   Ambulation/Gait  Yes    Ambulation/Gait Assistance  7: Independent    Ambulation Distance (Feet)  452 Feet    Assistive device  None    Gait Pattern  Step-through pattern;Decreased arm swing - right;Trunk flexed   decreasead heelstrike, toe-off pattern bil  Ambulation Surface  Level;Indoor    Gait velocity  0.76 m/s    Gait Comments  3MWT                   [facility] Adult PT Treatment/Exercise - 10/20/18 0001      Therapeutic Activites    Therapeutic Activities  Other Therapeutic Activities    Other Therapeutic Activities  Simulated bed environment to practice supine<>sit transfers. Educated pt on rolling into sidelying, assisting with elbows to prop and assist in elevating trunk upward, and bridging to assist in repositioning self in bed. Pt improved from min assist to conract guard with repeated reps.      Exercises   Exercises  Lumbar      Lumbar Exercises: Supine   Ab Set  20 reps    AB Set Limitations  TA with exhale in hooklying, 3 sec hold    Bridge  10 reps             PT Education - 10/20/18 1324    Education Details  Goal review, exercise technique, continue HEP, reviewed evaluation of numbness in bil toes not coming from low back pain based on objective findings    Person(s) Educated  Patient    Methods  Explanation    Comprehension   Verbalized understanding       PT Short Term Goals - 10/20/18 1325      PT SHORT TERM GOAL #1   Title  Pt will be independent with HEP, perform consistently and update PRN.    Time  2    Period  Weeks    Status  On-going    Target Date  10/31/18      PT SHORT TERM GOAL #2   Title  Pt will perform supine<>sit at mod independent without pain and correct sequencing to improve QoL.    Time  2    Period  Weeks    Status  On-going        PT Long Term Goals - 10/20/18 1326      PT LONG TERM GOAL #1   Title  Pt will ambulate 1.0 m/s or > to demo improved gait mechanics, reduced risk for falls and improve ability to perform hobbies.    Time  4    Period  Weeks    Status  On-going      PT LONG TERM GOAL #2   Title  Pt will improve BLE strength by 1 grade to improve ability to perform functional activities, gait mechanics and overall QoL.    Time  4    Period  Weeks    Status  On-going      PT LONG TERM GOAL #3   Title  Pt will improve bil SLS time to 30 sec to reduce risk for falls.    Time  4    Period  Weeks    Status  On-going      PT LONG TERM GOAL #4   Title  Pt will improve 5x STS to 12.6 sec or < to reduce risk for falls with functional activities.    Time  4    Period  Weeks    Status  On-going      PT LONG TERM GOAL #5   Title  Pt will improve FOTO by 10% to demo improved self perceived limitations with activities.    Time  4    Period  Weeks    Status  On-going  Plan - 10/20/18 1404    Clinical Impression Statement  Initiated pt's treatment session with reviewing goals and evaluation findings. Pt perform 5xSTS in 14 sec with no pain complaints and slightly outside her age related norm. Pt attempted 6MWT but only able to tolerate 3 minutes with 1 seated rest break after 31min 15 sec, completing 452 ft and gait speed of 0.76 m/s demonstrating increased risk for falls and decreased endurance. Educated pt on TA isometric contraction and bridging  exercises and able to perform with multiple reps practice. Repeated reps for supine<>sit transfers, improving ability to roll into sidelying and assist trunk upright with propping on elbows requiring contact guard assist at end. Also, pt with improved repositioning technique using bridging to require less physical assistance. Ended session with educating pt and pt's spouse on evaluation findings pointing towards back pain and bil foot numbness being unrelated, but therapy will improve strength in BLE and core to reduce back pain and therapy will improve balance despite numbness sensation in feet and both agreed in assessment findings and plan. Will continue to progress pt as able.    Personal Factors and Comorbidities  Age;Comorbidity 2;Time since onset of injury/illness/exacerbation    Comorbidities  HTN, diabetic neuropathy    Examination-Activity Limitations  Bed Mobility;Bend;Locomotion Level;Stand    Examination-Participation Restrictions  Meal Prep;Volunteer    Stability/Clinical Decision Making  Stable/Uncomplicated    Rehab Potential  Good    PT Frequency  2x / week    PT Duration  4 weeks    PT Treatment/Interventions  ADLs/Self Care Home Management;Aquatic Therapy;Biofeedback;Cryotherapy;Moist Heat;DME Instruction;Gait training;Stair training;Functional mobility training;Therapeutic activities;Therapeutic exercise;Balance training;Neuromuscular re-education;Patient/family education;Orthotic Fit/Training;Manual techniques;Passive range of motion;Dry needling;Taping;Joint Manipulations    PT Next Visit Plan  Continue strengthening for core and BLE hips. Flexiblity for hamstrings, hip flexors, piriformis and low back. Functional strengthening for transfers to improve performance and reduce pain. Manual STM as needed for pain control.    PT Home Exercise Plan  Eval: figure 4 stretch (sitting or supine based on comfort); 8/20: isometric TA contraction with exhale    Consulted and Agree with Plan of  Care  Patient;Family member/caregiver    Family Member Consulted  spouse       Patient will benefit from skilled therapeutic intervention in order to improve the following deficits and impairments:  Abnormal gait, Decreased activity tolerance, Decreased balance, Decreased endurance, Decreased range of motion, Decreased strength, Difficulty walking, Increased fascial restricitons, Increased muscle spasms, Impaired flexibility, Impaired sensation, Improper body mechanics, Pain  Visit Diagnosis: Chronic bilateral low back pain without sciatica  Muscle weakness (generalized)     Problem List Patient Active Problem List   Diagnosis Date Noted  . Diabetes mellitus 05/10/2015  . Diabetes ([place]) 05/02/2014  . Diabetic neuropathy ([place]) 05/22/2012  . Diarrhea 03/31/2012  . Hypertension   . Hypercholesteremia   . Hyperlipidemia   . YQMVHQIO([id number] Grumbling PT, DPT 10/20/18, 2:14 PM [place] [address] [place], [place], [address] Phone: [phone]   Fax:  [phone]  Name: Maria Esparza MRN: [id number] Date of Birth: 01-01-1946

## 2018-10-24 ENCOUNTER — Other Ambulatory Visit: Payer: Self-pay

## 2018-10-24 ENCOUNTER — Ambulatory Visit (HOSPITAL_COMMUNITY)
Admission: RE | Admit: 2018-10-24 | Discharge: 2018-10-24 | Disposition: A | Discharge status: Home / Self Care | Payer: Medicare HMO | Source: Ambulatory Visit | Attending: Internal Medicine | Admitting: Internal Medicine

## 2018-10-24 DIAGNOSIS — R221 Localized swelling, mass and lump, neck: Secondary | ICD-10-CM | POA: Diagnosis not present

## 2018-10-26 ENCOUNTER — Telehealth (HOSPITAL_COMMUNITY): Payer: Self-pay | Admitting: Internal Medicine

## 2018-10-26 ENCOUNTER — Ambulatory Visit (HOSPITAL_COMMUNITY): Payer: Medicare HMO | Admitting: Physical Therapy

## 2018-10-26 NOTE — Telephone Encounter (Signed)
10/26/18  pt called to cx stating that she was having issues with her bowels this morning.  She said she should be at the appt on 8/27

## 2018-10-27 ENCOUNTER — Ambulatory Visit (HOSPITAL_COMMUNITY): Payer: Medicare HMO

## 2018-10-27 DIAGNOSIS — R0782 Intercostal pain: Secondary | ICD-10-CM | POA: Diagnosis not present

## 2018-10-27 DIAGNOSIS — G629 Polyneuropathy, unspecified: Secondary | ICD-10-CM | POA: Diagnosis not present

## 2018-10-27 DIAGNOSIS — E1169 Type 2 diabetes mellitus with other specified complication: Secondary | ICD-10-CM | POA: Diagnosis not present

## 2018-10-27 DIAGNOSIS — Z0000 Encounter for general adult medical examination without abnormal findings: Secondary | ICD-10-CM | POA: Diagnosis not present

## 2018-10-27 DIAGNOSIS — G4089 Other seizures: Secondary | ICD-10-CM | POA: Diagnosis not present

## 2018-10-27 DIAGNOSIS — Z8673 Personal history of transient ischemic attack (TIA), and cerebral infarction without residual deficits: Secondary | ICD-10-CM | POA: Diagnosis not present

## 2018-10-27 DIAGNOSIS — R05 Cough: Secondary | ICD-10-CM | POA: Diagnosis not present

## 2018-10-27 DIAGNOSIS — J069 Acute upper respiratory infection, unspecified: Secondary | ICD-10-CM | POA: Diagnosis not present

## 2018-10-27 DIAGNOSIS — D509 Iron deficiency anemia, unspecified: Secondary | ICD-10-CM | POA: Diagnosis not present

## 2018-10-27 DIAGNOSIS — Z6835 Body mass index (BMI) 35.0-35.9, adult: Secondary | ICD-10-CM | POA: Diagnosis not present

## 2018-10-30 ENCOUNTER — Other Ambulatory Visit: Payer: Self-pay

## 2018-10-30 ENCOUNTER — Inpatient Hospital Stay (HOSPITAL_COMMUNITY)
Admission: EM | Admit: 2018-10-30 | Discharge: 2018-11-01 | DRG: 872 | Disposition: A | Discharge status: Home / Self Care | Payer: Medicare HMO | Attending: Family Medicine | Admitting: Family Medicine

## 2018-10-30 ENCOUNTER — Inpatient Hospital Stay (HOSPITAL_COMMUNITY): Payer: Medicare HMO

## 2018-10-30 ENCOUNTER — Encounter (HOSPITAL_COMMUNITY): Payer: Self-pay | Admitting: Emergency Medicine

## 2018-10-30 ENCOUNTER — Emergency Department (HOSPITAL_COMMUNITY): Payer: Medicare HMO

## 2018-10-30 DIAGNOSIS — R69 Illness, unspecified: Secondary | ICD-10-CM | POA: Diagnosis not present

## 2018-10-30 DIAGNOSIS — Z791 Long term (current) use of non-steroidal anti-inflammatories (NSAID): Secondary | ICD-10-CM | POA: Diagnosis not present

## 2018-10-30 DIAGNOSIS — R569 Unspecified convulsions: Secondary | ICD-10-CM | POA: Diagnosis not present

## 2018-10-30 DIAGNOSIS — J449 Chronic obstructive pulmonary disease, unspecified: Secondary | ICD-10-CM | POA: Diagnosis present

## 2018-10-30 DIAGNOSIS — R6520 Severe sepsis without septic shock: Secondary | ICD-10-CM

## 2018-10-30 DIAGNOSIS — E860 Dehydration: Secondary | ICD-10-CM | POA: Diagnosis present

## 2018-10-30 DIAGNOSIS — Z87891 Personal history of nicotine dependence: Secondary | ICD-10-CM | POA: Diagnosis not present

## 2018-10-30 DIAGNOSIS — E669 Obesity, unspecified: Secondary | ICD-10-CM | POA: Diagnosis present

## 2018-10-30 DIAGNOSIS — K580 Irritable bowel syndrome with diarrhea: Secondary | ICD-10-CM | POA: Diagnosis present

## 2018-10-30 DIAGNOSIS — N10 Acute tubulo-interstitial nephritis: Secondary | ICD-10-CM | POA: Diagnosis present

## 2018-10-30 DIAGNOSIS — E7800 Pure hypercholesterolemia, unspecified: Secondary | ICD-10-CM | POA: Diagnosis present

## 2018-10-30 DIAGNOSIS — N179 Acute kidney failure, unspecified: Secondary | ICD-10-CM | POA: Diagnosis present

## 2018-10-30 DIAGNOSIS — R4020 Unspecified coma: Secondary | ICD-10-CM | POA: Diagnosis not present

## 2018-10-30 DIAGNOSIS — E781 Pure hyperglyceridemia: Secondary | ICD-10-CM | POA: Diagnosis present

## 2018-10-30 DIAGNOSIS — R0902 Hypoxemia: Secondary | ICD-10-CM | POA: Diagnosis not present

## 2018-10-30 DIAGNOSIS — R509 Fever, unspecified: Secondary | ICD-10-CM

## 2018-10-30 DIAGNOSIS — E785 Hyperlipidemia, unspecified: Secondary | ICD-10-CM | POA: Diagnosis present

## 2018-10-30 DIAGNOSIS — R197 Diarrhea, unspecified: Secondary | ICD-10-CM | POA: Diagnosis present

## 2018-10-30 DIAGNOSIS — Z9049 Acquired absence of other specified parts of digestive tract: Secondary | ICD-10-CM | POA: Diagnosis not present

## 2018-10-30 DIAGNOSIS — Z794 Long term (current) use of insulin: Secondary | ICD-10-CM

## 2018-10-30 DIAGNOSIS — E1140 Type 2 diabetes mellitus with diabetic neuropathy, unspecified: Secondary | ICD-10-CM | POA: Diagnosis present

## 2018-10-30 DIAGNOSIS — Z7982 Long term (current) use of aspirin: Secondary | ICD-10-CM | POA: Diagnosis not present

## 2018-10-30 DIAGNOSIS — Z79899 Other long term (current) drug therapy: Secondary | ICD-10-CM

## 2018-10-30 DIAGNOSIS — E1165 Type 2 diabetes mellitus with hyperglycemia: Secondary | ICD-10-CM | POA: Diagnosis not present

## 2018-10-30 DIAGNOSIS — A09 Infectious gastroenteritis and colitis, unspecified: Secondary | ICD-10-CM | POA: Diagnosis not present

## 2018-10-30 DIAGNOSIS — K8020 Calculus of gallbladder without cholecystitis without obstruction: Secondary | ICD-10-CM | POA: Diagnosis present

## 2018-10-30 DIAGNOSIS — J80 Acute respiratory distress syndrome: Secondary | ICD-10-CM | POA: Diagnosis not present

## 2018-10-30 DIAGNOSIS — Z6837 Body mass index (BMI) 37.0-37.9, adult: Secondary | ICD-10-CM | POA: Diagnosis not present

## 2018-10-30 DIAGNOSIS — Z20828 Contact with and (suspected) exposure to other viral communicable diseases: Secondary | ICD-10-CM | POA: Diagnosis present

## 2018-10-30 DIAGNOSIS — R0602 Shortness of breath: Secondary | ICD-10-CM | POA: Diagnosis not present

## 2018-10-30 DIAGNOSIS — A4151 Sepsis due to Escherichia coli [E. coli]: Secondary | ICD-10-CM | POA: Diagnosis present

## 2018-10-30 DIAGNOSIS — A419 Sepsis, unspecified organism: Secondary | ICD-10-CM | POA: Diagnosis present

## 2018-10-30 DIAGNOSIS — I10 Essential (primary) hypertension: Secondary | ICD-10-CM | POA: Diagnosis not present

## 2018-10-30 DIAGNOSIS — R000 Tachycardia, unspecified: Secondary | ICD-10-CM | POA: Diagnosis not present

## 2018-10-30 DIAGNOSIS — E119 Type 2 diabetes mellitus without complications: Secondary | ICD-10-CM

## 2018-10-30 HISTORY — DX: Polyneuropathy, unspecified: G62.9

## 2018-10-30 HISTORY — DX: Unspecified convulsions: R56.9

## 2018-10-30 LAB — CBC WITH DIFFERENTIAL/PLATELET
Abs Immature Granulocytes: 0.17 K/uL — ABNORMAL HIGH (ref 0.00–0.07)
Basophils Absolute: 0.1 K/uL (ref 0.0–0.1)
Basophils Relative: 0 %
Eosinophils Absolute: 0 K/uL (ref 0.0–0.5)
Eosinophils Relative: 0 %
HCT: 41.8 % (ref 36.0–46.0)
Hemoglobin: 13.1 g/dL (ref 12.0–15.0)
Immature Granulocytes: 1 %
Lymphocytes Relative: 5 %
Lymphs Abs: 1.3 K/uL (ref 0.7–4.0)
MCH: 29.4 pg (ref 26.0–34.0)
MCHC: 31.3 g/dL (ref 30.0–36.0)
MCV: 93.7 fL (ref 80.0–100.0)
Monocytes Absolute: 1.3 K/uL — ABNORMAL HIGH (ref 0.1–1.0)
Monocytes Relative: 5 %
Neutro Abs: 24.8 K/uL — ABNORMAL HIGH (ref 1.7–7.7)
Neutrophils Relative %: 89 %
Platelets: 353 K/uL (ref 150–400)
RBC: 4.46 MIL/uL (ref 3.87–5.11)
RDW: 14 % (ref 11.5–15.5)
WBC: 27.6 K/uL — ABNORMAL HIGH (ref 4.0–10.5)
nRBC: 0 % (ref 0.0–0.2)

## 2018-10-30 LAB — COMPREHENSIVE METABOLIC PANEL WITH GFR
ALT: 32 U/L (ref 0–44)
AST: 48 U/L — ABNORMAL HIGH (ref 15–41)
Albumin: 3.5 g/dL (ref 3.5–5.0)
Alkaline Phosphatase: 63 U/L (ref 38–126)
Anion gap: 13 (ref 5–15)
BUN: 22 mg/dL (ref 8–23)
CO2: 19 mmol/L — ABNORMAL LOW (ref 22–32)
Calcium: 9 mg/dL (ref 8.9–10.3)
Chloride: 106 mmol/L (ref 98–111)
Creatinine, Ser: 1.45 mg/dL — ABNORMAL HIGH (ref 0.44–1.00)
GFR calc Af Amer: 41 mL/min — ABNORMAL LOW (ref >60)
GFR calc non Af Amer: 35 mL/min — ABNORMAL LOW (ref >60)
Glucose, Bld: 171 mg/dL — ABNORMAL HIGH (ref 70–99)
Potassium: 3.8 mmol/L (ref 3.5–5.1)
Sodium: 138 mmol/L (ref 135–145)
Total Bilirubin: 0.5 mg/dL (ref 0.3–1.2)
Total Protein: 7 g/dL (ref 6.5–8.1)

## 2018-10-30 LAB — URINALYSIS, ROUTINE W REFLEX MICROSCOPIC
Bilirubin Urine: NEGATIVE
Glucose, UA: 50 mg/dL — AB
Ketones, ur: NEGATIVE mg/dL
Nitrite: POSITIVE — AB
Protein, ur: 100 mg/dL — AB
Specific Gravity, Urine: 1.018 (ref 1.005–1.030)
WBC, UA: >50 WBC/hpf — ABNORMAL HIGH (ref 0–5)
pH: 5 (ref 5.0–8.0)

## 2018-10-30 LAB — GLUCOSE, CAPILLARY
Glucose-Capillary: 131 mg/dL — ABNORMAL HIGH (ref 70–99)
Glucose-Capillary: 95 mg/dL (ref 70–99)
Glucose-Capillary: 128 mg/dL — ABNORMAL HIGH (ref 70–99)

## 2018-10-30 LAB — PROTIME-INR
INR: 1.2 (ref 0.8–1.2)
Prothrombin Time: 14.8 seconds (ref 11.4–15.2)

## 2018-10-30 LAB — LACTIC ACID, PLASMA
Lactic Acid, Venous: 4.3 mmol/L (ref 0.5–1.9)
Lactic Acid, Venous: 2.7 mmol/L (ref 0.5–1.9)

## 2018-10-30 LAB — SARS CORONAVIRUS 2 BY RT PCR (HOSPITAL ORDER, PERFORMED IN ~~LOC~~ HOSPITAL LAB): SARS Coronavirus 2: NEGATIVE

## 2018-10-30 LAB — APTT: aPTT: 34 seconds (ref 24–36)

## 2018-10-30 MED ORDER — SODIUM CHLORIDE 0.9% FLUSH
3.0000 mL | Freq: Two times a day (BID) | INTRAVENOUS | Status: DC
  Administered 2018-10-30 – 2018-10-31 (×2): 3 mL via INTRAVENOUS

## 2018-10-30 MED ORDER — ONDANSETRON HCL 4 MG/2ML IJ SOLN: 4.0000 mg | Freq: Four times a day (QID) | INTRAMUSCULAR | Status: DC | PRN

## 2018-10-30 MED ORDER — OMEGA-3-ACID ETHYL ESTERS 1 G PO CAPS
1.0000 g | ORAL_CAPSULE | Freq: Every day | ORAL | Status: DC
  Administered 2018-10-30 – 2018-11-01 (×3): 1 g via ORAL
  Filled 2018-10-30 (×5): qty 1

## 2018-10-30 MED ORDER — ROSUVASTATIN CALCIUM 10 MG PO TABS
10.0000 mg | ORAL_TABLET | Freq: Every day | ORAL | Status: DC
  Administered 2018-10-30 – 2018-11-01 (×3): 10 mg via ORAL
  Filled 2018-10-30 (×3): qty 1

## 2018-10-30 MED ORDER — INSULIN GLARGINE 100 UNIT/ML ~~LOC~~ SOLN
25.0000 [IU] | Freq: Every day | SUBCUTANEOUS | Status: DC
  Administered 2018-10-30 – 2018-10-31 (×2): 25 [IU] via SUBCUTANEOUS
  Filled 2018-10-30 (×3): qty 0.25

## 2018-10-30 MED ORDER — VANCOMYCIN HCL IN DEXTROSE 1-5 GM/200ML-% IV SOLN: 1000.0000 mg | Freq: Once | INTRAVENOUS | Status: DC

## 2018-10-30 MED ORDER — PIOGLITAZONE HCL 15 MG PO TABS
30.0000 mg | ORAL_TABLET | Freq: Every day | ORAL | Status: DC
  Administered 2018-10-30 – 2018-11-01 (×3): 30 mg via ORAL
  Filled 2018-10-30 (×4): qty 2

## 2018-10-30 MED ORDER — CALCIUM CARBONATE-VITAMIN D 500-200 MG-UNIT PO TABS
1.0000 | ORAL_TABLET | Freq: Two times a day (BID) | ORAL | Status: DC
  Administered 2018-10-30 – 2018-11-01 (×4): 1 via ORAL
  Filled 2018-10-30 (×4): qty 1

## 2018-10-30 MED ORDER — GABAPENTIN 300 MG PO CAPS
300.0000 mg | ORAL_CAPSULE | Freq: Three times a day (TID) | ORAL | Status: DC
  Administered 2018-10-30 – 2018-11-01 (×6): 300 mg via ORAL
  Filled 2018-10-30 (×6): qty 1

## 2018-10-30 MED ORDER — OXYBUTYNIN CHLORIDE 5 MG PO TABS
5.0000 mg | ORAL_TABLET | Freq: Three times a day (TID) | ORAL | Status: DC
  Administered 2018-10-30 – 2018-11-01 (×6): 5 mg via ORAL
  Filled 2018-10-30 (×6): qty 1

## 2018-10-30 MED ORDER — SODIUM CHLORIDE 0.9 % IV SOLN: 2.0000 g | Freq: Once | INTRAVENOUS | Status: DC

## 2018-10-30 MED ORDER — METRONIDAZOLE IN NACL 5-0.79 MG/ML-% IV SOLN
500.0000 mg | Freq: Once | INTRAVENOUS | Status: AC
  Administered 2018-10-30: 500 mg via INTRAVENOUS
  Filled 2018-10-30: qty 100

## 2018-10-30 MED ORDER — SODIUM CHLORIDE 0.9 % IV SOLN
2.0000 g | INTRAVENOUS | Status: DC
  Administered 2018-10-30 – 2018-10-31 (×2): 2 g via INTRAVENOUS
  Filled 2018-10-30 (×2): qty 20

## 2018-10-30 MED ORDER — ACETAMINOPHEN 650 MG RE SUPP: 650.0000 mg | Freq: Four times a day (QID) | RECTAL | Status: DC | PRN

## 2018-10-30 MED ORDER — ASPIRIN EC 81 MG PO TBEC
81.0000 mg | DELAYED_RELEASE_TABLET | Freq: Every day | ORAL | Status: DC
  Administered 2018-10-30 – 2018-11-01 (×3): 81 mg via ORAL
  Filled 2018-10-30 (×3): qty 1

## 2018-10-30 MED ORDER — ACETAMINOPHEN 650 MG RE SUPP
650.0000 mg | Freq: Once | RECTAL | Status: AC
  Administered 2018-10-30: 06:00:00 650 mg via RECTAL
  Filled 2018-10-30: qty 1

## 2018-10-30 MED ORDER — HEPARIN SODIUM (PORCINE) 5000 UNIT/ML IJ SOLN
5000.0000 [IU] | Freq: Three times a day (TID) | INTRAMUSCULAR | Status: DC
  Administered 2018-10-30 – 2018-11-01 (×6): 5000 [IU] via SUBCUTANEOUS
  Filled 2018-10-30 (×6): qty 1

## 2018-10-30 MED ORDER — TRAZODONE HCL 50 MG PO TABS: 50.0000 mg | ORAL_TABLET | Freq: Every evening | ORAL | Status: DC | PRN

## 2018-10-30 MED ORDER — INSULIN ASPART 100 UNIT/ML ~~LOC~~ SOLN
0.0000 [IU] | Freq: Three times a day (TID) | SUBCUTANEOUS | Status: DC
  Administered 2018-10-30 – 2018-11-01 (×3): 1 [IU] via SUBCUTANEOUS

## 2018-10-30 MED ORDER — INSULIN ASPART 100 UNIT/ML ~~LOC~~ SOLN: 0.0000 [IU] | Freq: Every day | SUBCUTANEOUS | Status: DC

## 2018-10-30 MED ORDER — VANCOMYCIN HCL 10 G IV SOLR
2500.0000 mg | Freq: Once | INTRAVENOUS | Status: DC
  Filled 2018-10-30: qty 2500

## 2018-10-30 MED ORDER — LEVETIRACETAM 500 MG PO TABS
500.0000 mg | ORAL_TABLET | Freq: Two times a day (BID) | ORAL | Status: DC
  Administered 2018-10-30 – 2018-11-01 (×5): 500 mg via ORAL
  Filled 2018-10-30 (×5): qty 1

## 2018-10-30 MED ORDER — SODIUM CHLORIDE 0.9 % IV BOLUS (SEPSIS)
1000.0000 mL | Freq: Once | INTRAVENOUS | Status: AC
  Administered 2018-10-30: 1000 mL via INTRAVENOUS

## 2018-10-30 MED ORDER — FERROUS SULFATE 325 (65 FE) MG PO TABS
325.0000 mg | ORAL_TABLET | Freq: Every day | ORAL | Status: DC
  Administered 2018-10-31 – 2018-11-01 (×2): 325 mg via ORAL
  Filled 2018-10-30 (×2): qty 1

## 2018-10-30 MED ORDER — SODIUM CHLORIDE 0.9 % IV SOLN
2.0000 g | Freq: Three times a day (TID) | INTRAVENOUS | Status: DC
  Administered 2018-10-30: 2 g via INTRAVENOUS
  Filled 2018-10-30: qty 2

## 2018-10-30 MED ORDER — ALBUTEROL SULFATE (2.5 MG/3ML) 0.083% IN NEBU
2.5000 mg | INHALATION_SOLUTION | RESPIRATORY_TRACT | Status: DC | PRN
  Administered 2018-10-31: 2.5 mg via RESPIRATORY_TRACT
  Filled 2018-10-30: qty 3

## 2018-10-30 MED ORDER — ONDANSETRON HCL 4 MG PO TABS: 4.0000 mg | ORAL_TABLET | Freq: Four times a day (QID) | ORAL | Status: DC | PRN

## 2018-10-30 MED ORDER — HYDRALAZINE HCL 20 MG/ML IJ SOLN: 10.0000 mg | Freq: Four times a day (QID) | INTRAMUSCULAR | Status: DC | PRN

## 2018-10-30 MED ORDER — ACETAMINOPHEN 325 MG PO TABS
650.0000 mg | ORAL_TABLET | Freq: Four times a day (QID) | ORAL | Status: DC | PRN
  Administered 2018-10-30 – 2018-10-31 (×2): 650 mg via ORAL
  Filled 2018-10-30 (×2): qty 2

## 2018-10-30 MED ORDER — SODIUM CHLORIDE 0.9 % IV SOLN: 2.0000 g | Freq: Two times a day (BID) | INTRAVENOUS | Status: DC

## 2018-10-30 MED ORDER — SODIUM CHLORIDE 0.9 % IV SOLN
INTRAVENOUS | Status: DC
  Administered 2018-10-30 – 2018-11-01 (×5): via INTRAVENOUS

## 2018-10-30 MED ORDER — POLYETHYLENE GLYCOL 3350 17 G PO PACK: 17.0000 g | PACK | Freq: Every day | ORAL | Status: DC | PRN

## 2018-10-30 MED ORDER — SODIUM CHLORIDE 0.9% FLUSH: 3.0000 mL | INTRAVENOUS | Status: DC | PRN

## 2018-10-30 MED ORDER — SODIUM CHLORIDE 0.9 % IV SOLN: 250.0000 mL | INTRAVENOUS | Status: DC | PRN

## 2018-10-30 NOTE — ED Provider Notes (Signed)
Pt received at change of shift with admission call to [facility] pending. 73yo F with fever; sepsis order set completed, including IVF bolus and IV abx. +UTI, UC and BC pending. CT with likely pyelonephritis. T/C returned from [facility]. Denton Brick, case discussed, including:  HPI, pertinent PM/SHx, VS/PE, dx testing, ED course and treatment:  Agreeable to admit.    Francine Graven, DO 10/30/18 1001

## 2018-10-30 NOTE — H&P (Signed)
Patient Demographics:    Maria Esparza, is a 73 y.o. female  MRN: [id number]   DOB - 1945/10/19  Admit Date - 10/30/2018  Outpatient Primary MD for the patient is Celene Squibb, MD   Assessment & Plan:    Principal Problem:   Sepsis from Urinary Source Active Problems:   Acute pyelonephritis   Hypertension   Diarrhea   DM (diabetes mellitus) ([place])    1)Sepsis secondary to urinary source----patient had fevers with temp up to 102.8 , had tachycardia with heart rates up to 115 , patient also had tachypnea, lactic acid is down to 2.7 from 4.3 after initial IV fluids, WBC was 27.6 =-Patient initially received vancomycin, Flagyl and cefepime in the ED -Given urinary source for infection will de-escalate to IV Rocephin pending blood and urine cultures C/nI VF  2)Acute pyelonephritis--  UA strongly suggestive of UTI, CT abdomen and pelvis with findings of perinephric stranding bilaterally most likely suggestive of pyelonephritis-IV Rocephin pending cultures  3)COPD/emphysema--- no acute exacerbation, continue bronchodilators  4)HTN--okay to hold losartan due to kidney concerns,   may use IV Hydralazine 10 mg  Every 4 hours Prn for systolic blood pressure over 160 mmhg  5)DM2-hold metformin due to kidney concerns, c/n Actos -Use lantus at reduced dose of 25 units daily -Use Novolog/Humalog Sliding scale insulin with Accu-Cheks /Fingersticks as ordered  6)AKI----acute kidney injury suspect due to UTI and dehydration   creatinine on admission= 1.45 ,   baseline creatinine = 1.0    ,      , renally adjust medications, avoid nephrotoxic agents / dehydration /hypotension -Hold losartan, hold metformin, avoid NSAIDs  7)cholelithiasis without evidence for acute cholecystitis--asymptomatic, expectant management  8)  prior history of possible seizures--doubt that patient actually had  seizure this time around suspect patient had shaking chills and rigors from UTI/pyelonephritis rather than frank seizures -Continue home dose Keppra  9) diarrhea--patient has had diarrhea for about 2 weeks now, PCP previously ordered stool studies will get those while patient is here-defer C. difficile and GI pathogen pending    With History of - Reviewed by me  Past Medical History:  Diagnosis Date   Anemia    Arthritis    Asthma    mild   Cough    Diabetes mellitus type 2, uncomplicated ([place])    x 10 yrs   Diabetic neuropathy ([place]) rt foot   Headache(784.0)    Hypercholesteremia    Hyperlipidemia    Hypertension    Hypertriglyceridemia 2002   IBS (irritable bowel syndrome)    Neuropathy    Seizures ([place])       Past Surgical History:  Procedure Laterality Date   APPENDECTOMY     BREAST SURGERY  1999   left breast biopsy   CERVICAL SPINE SURGERY  december 2003   [facility]   for unknown reason.  They thought she had a mass.   COLONOSCOPY  12/04/2010  Procedure: COLONOSCOPY;  Surgeon: Rogene Houston, MD;  Location: [facility];  Service: Endoscopy;  Laterality: N/A;  3:00   COLONOSCOPY N/A 01/18/2015   Procedure: COLONOSCOPY;  Surgeon: Rogene Houston, MD;  Location: [facility];  Service: Endoscopy;  Laterality: N/A;  955 - moved to 10:40 - Ann notified pt      Chief Complaint  Patient presents with   Shortness of Breath      HPI:    Maria Esparza  is a 73 y.o. female with past medical history relevant for HTN, obesity, DM, possible seizure disorder (on Keppra), history of asthma/COPD and HLD presents to ED via EMS due to fevers and shaking chills at home -Initially patient's husband thought she was having a seizure due to shaking chills and rigors as well as lethargy -No tongue biting, no incontinence, no postictal type findings -doubt that patient  actually had  seizure this time around suspect patient had shaking chills and rigors from UTI/pyelonephritis rather than frank seizures  --Additional history obtained from patient's husband at bedside  -Patient was having diarrhea for last couple weeks, stools are loose to mushy, usually without any blood or mucus, - PCP had requested stool studies--results of the outpatient stool studies are pending  -Overnight patient developed fevers, shaking chills, rigors, also developed nausea and vomiting emesis was without bile or blood -Patient had back/flank pain, she did have some urinary discomfort but denies significant frequency -Patient became somewhat lethargic husband was worried about possible seizures and called EMS   in ED -Temperature 102.8, heart rate up to 115, respiratory rate was 23, O2 sats was 91% on room air  lactic acid is down to 2.7 from 4.3 after initial IV fluids,  -WBC was 27.6 -UA strongly suspicious for UTI, CT chest, abdomen and pelvis -with COPD findings and perinephric findings suggestive of pyelonephritis bilaterally  =-Patient initially received vancomycin, Flagyl and cefepime in the ED    Review of systems:    In addition to the HPI above,   A full Review of  Systems was done, all other systems reviewed are negative except as noted above in HPI , .    Social History:  Reviewed by me    Social History   Tobacco Use   Smoking status: Former Smoker    Packs/day: 3.00    Years: 20.00    Pack years: 60.00   Smokeless tobacco: Never Used   Tobacco comment: Quit smoking in early 3s after smoking 20 yrs,.  Substance Use Topics   Alcohol use: Yes    Alcohol/week: 0.0 standard drinks    Comment: very rare occasion on a holiday       Family History :  Reviewed by me    Family History  Adopted: Yes    Home Medications:   Prior to Admission medications   Medication Sig Start Date End Date Taking? Authorizing Provider  aspirin EC 81 MG tablet  Take 81 mg by mouth daily.   Yes [provider]  calcium carbonate (OS-CAL) 600 MG TABS Take 600 mg by mouth 2 (two) times daily before a meal. With Vitamin D    Yes [provider]  Ferrous Sulfate (IRON PO) Take 65 mg by mouth daily.   Yes [provider]  fish oil-omega-3 fatty acids 1000 MG capsule Take 1 g by mouth daily.     Yes [provider]  gabapentin (NEURONTIN) 300 MG capsule Take 1 capsule (300 mg total) by mouth  3 (three) times daily. 10/04/18  Yes Penumalli, Earlean Polka, MD  Insulin Degludec (TRESIBA) 100 UNIT/ML SOLN Inject 36 Units into the skin.   Yes [provider]  levETIRAcetam (KEPPRA) 500 MG tablet Take 1 tablet (500 mg total) by mouth 2 (two) times daily. 07/13/18  Yes Penumalli, Earlean Polka, MD  metFORMIN (GLUCOPHAGE) 500 MG tablet 1 TABLET BY MOUTH TWICE DAILY WITH MEALS 02/18/16  Yes Mikey Kirschner, MD  Multiple Vitamins-Minerals (CENTRUM SILVER PO) Take by mouth.   Yes [provider]  oxybutynin (DITROPAN) 5 MG tablet Take 10 mg by mouth daily.    Yes [provider]  pioglitazone (ACTOS) 30 MG tablet Take 30 mg by mouth daily.   Yes [provider]  rosuvastatin (CRESTOR) 10 MG tablet Take 10 mg by mouth daily. 07/09/18  Yes [provider]  diclofenac (VOLTAREN) 75 MG EC tablet Take 75 mg by mouth 2 (two) times daily.    [provider]  losartan (COZAAR) 50 MG tablet TAKE 1 TABLET(50 MG) BY MOUTH DAILY Patient not taking: Reported on 07/12/2018 06/24/15   Mikey Kirschner, MD  naproxen (NAPROSYN) 500 MG tablet TAKE 1 TABLET(500 MG) BY MOUTH TWICE DAILY WITH A MEAL Patient not taking: Reported on 07/12/2018 12/30/15   Sanjuana Kava, MD     Allergies:     Allergies  Allergen Reactions   Penicillins Other (See Comments)    Vomiting, ge 10 Can take cephal Did it involve swelling of the face/tongue/throat, SOB, or low BP? No Did it involve sudden or severe rash/hives, skin  peeling, or any reaction on the inside of your mouth or nose? No Did you need to seek medical attention at a hospital or doctor's office? No When did it last happen? If all above answers are NO, may proceed with cephalosporin use.    Prinivil [Lisinopril] Cough     Physical Exam:   Vitals  Temp:  [98.7 F (37.1 C)-102.8 F (39.3 C)] 99.2 F (37.3 C) (08/30 1020) Pulse Rate:  [90-115] 91 (08/30 1020) Resp:  [17-23] 20 (08/30 1020) BP: (102-134)/(47-96) 102/84 (08/30 1020) SpO2:  [91 %-99 %] 97 % (08/30 1020) Weight:  [113.5 kg-114.4 kg] 114.4 kg (08/30 1021)   Physical Examination: General appearance - alert, ill appearing, , obese,  in no distress a Mental status -more awake and more oriented at this time  eyes - sclera anicteric Neck - supple, no JVD elevation , Chest -diminished in bases without wheezing Heart - S1 and S2 normal, regular , heart rate was up to 115 Abdomen - soft, nontender, nondistended, inconsistent flank/CVA area tenderness, increased truncal adiposity noted  neurological - screening mental status exam normal, neck supple without rigidity, cranial nerves II through XII intact, DTR's normal and symmetric Extremities - no pedal edema noted, intact peripheral pulses  Skin - warm, dry     Data Review:    CBC Recent Labs  Lab 10/30/18 0558  WBC 27.6*  HGB 13.1  HCT 41.8  PLT 353  MCV 93.7  MCH 29.4  MCHC 31.3  RDW 14.0  LYMPHSABS 1.3  MONOABS 1.3*  EOSABS 0.0  BASOSABS 0.1   ------------------------------------------------------------------------------------------------------------------  Chemistries  Recent Labs  Lab 10/30/18 0558  NA 138  K 3.8  CL 106  CO2 19*  GLUCOSE 171*  BUN 22  CREATININE 1.45*  CALCIUM 9.0  AST 48*  ALT 32  ALKPHOS 63  BILITOT 0.5   ------------------------------------------------------------------------------------------------------------------ estimated creatinine clearance is 45.2 mL/min  (  A) (by C-G formula based on SCr of 1.45 mg/dL (H)). ------------------------------------------------------------------------------------------------------------------ No results for input(s): TSH, T4TOTAL, T3FREE, THYROIDAB in the last 72 hours.  Invalid input(s): FREET3   Coagulation profile Recent Labs  Lab 10/30/18 0558  INR 1.2   ------------------------------------------------------------------------------------------------------------------- No results for input(s): DDIMER in the last 72 hours. -------------------------------------------------------------------------------------------------------------------  Cardiac Enzymes No results for input(s): CKMB, TROPONINI, MYOGLOBIN in the last 168 hours.  Invalid input(s): CK ------------------------------------------------------------------------------------------------------------------ No results found for: BNP   ---------------------------------------------------------------------------------------------------------------  Urinalysis    Component Value Date/Time   COLORURINE AMBER (A) 10/30/2018 0804   APPEARANCEUR CLOUDY (A) 10/30/2018 0804   LABSPEC 1.018 10/30/2018 0804   PHURINE 5.0 10/30/2018 0804   GLUCOSEU 50 (A) 10/30/2018 0804   HGBUR SMALL (A) 10/30/2018 0804   BILIRUBINUR NEGATIVE 10/30/2018 0804   KETONESUR NEGATIVE 10/30/2018 0804   PROTEINUR 100 (A) 10/30/2018 0804   NITRITE POSITIVE (A) 10/30/2018 0804   LEUKOCYTESUR MODERATE (A) 10/30/2018 0804    ----------------------------------------------------------------------------------------------------------------   Imaging Results:    Ct Abdomen Pelvis Wo Contrast  Result Date: 10/30/2018 CLINICAL DATA:  73 year old female with fever of unknown origin and sepsis. EXAM: CT CHEST, ABDOMEN AND PELVIS WITHOUT CONTRAST TECHNIQUE: Multidetector CT imaging of the chest, abdomen and pelvis was performed following the standard protocol without IV contrast.  COMPARISON:  None FINDINGS: CT CHEST FINDINGS Cardiovascular: Limited evaluation in the absence of intravenous contrast. Conventional 3 vessel arch anatomy. Mild atherosclerotic plaque along the thoracic aorta. No evidence of aneurysm. Calcifications present throughout the coronary arteries including the left main, left anterior descending, circumflex and right coronary artery. The heart is normal in size. No pericardial effusion. Main pulmonary artery is normal in size. Mediastinum/Nodes: Unremarkable CT appearance of the thyroid gland. No suspicious mediastinal or hilar adenopathy. No soft tissue mediastinal mass. The thoracic esophagus is unremarkable. Lungs/Pleura: Significant respiratory motion artifact limits evaluation for pulmonary nodules. Moderately severe centrilobular paraseptal emphysema. Diffuse mild bronchial wall thickening. Mild interlobular septal thickening in the upper lungs suggests mild edema. Dependent atelectasis bilaterally. Mild subpleural reticulation and probable architectural distortion in the periphery of the lungs. Musculoskeletal: No acute fracture or aggressive appearing lytic or blastic osseous lesion. CT ABDOMEN PELVIS FINDINGS Hepatobiliary: Normal hepatic contour. No discrete lesion. No biliary ductal dilatation. Calcified stones within the gallbladder lumen. No secondary evidence of acute cholecystitis. Pancreas: Fatty atrophy of the pancreas.  No inflammation or mass. Spleen: Normal in size without focal abnormality. Adrenals/Urinary Tract: Normal adrenal glands. Nonspecific perinephric stranding bilaterally. The kidneys are somewhat indistinct in appearance. No hydronephrosis or nephrolithiasis. There are several circumscribed water attenuation structures which are incompletely evaluated in the absence of IV contrast. Statistically, these are likely simple cysts. The largest, exophytic from the left lower pole measures up to 9 cm in diameter. Unremarkable appearance of the  ureters. The bladder is decompressed. Small locule of gas in the bladder dome likely related to recent instrumentation. Stomach/Bowel: No focal bowel wall thickening or evidence of obstruction. The appendix is surgically absent. Vascular/Lymphatic: Limited evaluation in the absence of IV contrast. Fairly extensive atherosclerotic vascular calcifications along the abdominal aorta. No lymphadenopathy. Reproductive: Uterus and bilateral adnexa are unremarkable. Other: No ascites or hernia. Musculoskeletal: No acute fracture or aggressive appearing lytic or blastic osseous lesion. Multilevel degenerative disc disease and lower lumbar facet arthropathy. Mild grade 1 anterolisthesis of L4 on L5. IMPRESSION: 1. Although incompletely evaluated in the absence of IV contrast, the kidneys appear slightly indistinct and demonstrate nonspecific perinephric stranding bilaterally. Recommend clinical  correlation with urinalysis to assess for urinary tract infection. 2. Due to fairly significant respiratory motion artifact and a background of chronic changes, it is difficult to exclude a subtle infectious/inflammatory process in the lungs. No obvious pneumonia identified. 3. Bilateral low-attenuation renal masses are incompletely evaluated without IV contrast. These are water attenuation and statistically likely to represent benign cysts. 4. Cholelithiasis without secondary imaging evidence to suggest cholecystitis. 5. Pulmonary emphysema and likely chronic bronchial wall thickening consistent with COPD superimposed on a background of peripheral fibrotic changes. 6. Aortic atherosclerosis and multivessel coronary artery disease. 7. Multilevel degenerative disc disease and mid and lower lumbar facet arthropathy. Aortic Atherosclerosis (ICD10-I70.0) and Emphysema (ICD10-J43.9). Electronically Signed   By: Jacqulynn Cadet M.D.   On: 10/30/2018 09:14   Ct Chest Wo Contrast  Result Date: 10/30/2018 CLINICAL DATA:  73 year old  female with fever of unknown origin and sepsis. EXAM: CT CHEST, ABDOMEN AND PELVIS WITHOUT CONTRAST TECHNIQUE: Multidetector CT imaging of the chest, abdomen and pelvis was performed following the standard protocol without IV contrast. COMPARISON:  None FINDINGS: CT CHEST FINDINGS Cardiovascular: Limited evaluation in the absence of intravenous contrast. Conventional 3 vessel arch anatomy. Mild atherosclerotic plaque along the thoracic aorta. No evidence of aneurysm. Calcifications present throughout the coronary arteries including the left main, left anterior descending, circumflex and right coronary artery. The heart is normal in size. No pericardial effusion. Main pulmonary artery is normal in size. Mediastinum/Nodes: Unremarkable CT appearance of the thyroid gland. No suspicious mediastinal or hilar adenopathy. No soft tissue mediastinal mass. The thoracic esophagus is unremarkable. Lungs/Pleura: Significant respiratory motion artifact limits evaluation for pulmonary nodules. Moderately severe centrilobular paraseptal emphysema. Diffuse mild bronchial wall thickening. Mild interlobular septal thickening in the upper lungs suggests mild edema. Dependent atelectasis bilaterally. Mild subpleural reticulation and probable architectural distortion in the periphery of the lungs. Musculoskeletal: No acute fracture or aggressive appearing lytic or blastic osseous lesion. CT ABDOMEN PELVIS FINDINGS Hepatobiliary: Normal hepatic contour. No discrete lesion. No biliary ductal dilatation. Calcified stones within the gallbladder lumen. No secondary evidence of acute cholecystitis. Pancreas: Fatty atrophy of the pancreas.  No inflammation or mass. Spleen: Normal in size without focal abnormality. Adrenals/Urinary Tract: Normal adrenal glands. Nonspecific perinephric stranding bilaterally. The kidneys are somewhat indistinct in appearance. No hydronephrosis or nephrolithiasis. There are several circumscribed water attenuation  structures which are incompletely evaluated in the absence of IV contrast. Statistically, these are likely simple cysts. The largest, exophytic from the left lower pole measures up to 9 cm in diameter. Unremarkable appearance of the ureters. The bladder is decompressed. Small locule of gas in the bladder dome likely related to recent instrumentation. Stomach/Bowel: No focal bowel wall thickening or evidence of obstruction. The appendix is surgically absent. Vascular/Lymphatic: Limited evaluation in the absence of IV contrast. Fairly extensive atherosclerotic vascular calcifications along the abdominal aorta. No lymphadenopathy. Reproductive: Uterus and bilateral adnexa are unremarkable. Other: No ascites or hernia. Musculoskeletal: No acute fracture or aggressive appearing lytic or blastic osseous lesion. Multilevel degenerative disc disease and lower lumbar facet arthropathy. Mild grade 1 anterolisthesis of L4 on L5. IMPRESSION: 1. Although incompletely evaluated in the absence of IV contrast, the kidneys appear slightly indistinct and demonstrate nonspecific perinephric stranding bilaterally. Recommend clinical correlation with urinalysis to assess for urinary tract infection. 2. Due to fairly significant respiratory motion artifact and a background of chronic changes, it is difficult to exclude a subtle infectious/inflammatory process in the lungs. No obvious pneumonia identified. 3. Bilateral low-attenuation renal masses are  incompletely evaluated without IV contrast. These are water attenuation and statistically likely to represent benign cysts. 4. Cholelithiasis without secondary imaging evidence to suggest cholecystitis. 5. Pulmonary emphysema and likely chronic bronchial wall thickening consistent with COPD superimposed on a background of peripheral fibrotic changes. 6. Aortic atherosclerosis and multivessel coronary artery disease. 7. Multilevel degenerative disc disease and mid and lower lumbar facet  arthropathy. Aortic Atherosclerosis (ICD10-I70.0) and Emphysema (ICD10-J43.9). Electronically Signed   By: Jacqulynn Cadet M.D.   On: 10/30/2018 09:14   Dg Chest Portable 1 View  Result Date: 10/30/2018 CLINICAL DATA:  Shortness of breath.  Vomiting.  Possible seizure. EXAM: PORTABLE CHEST 1 VIEW COMPARISON:  04/16/2017 FINDINGS: Lower cervical spine fixation. Midline trachea. Normal heart size. Mild right hemidiaphragm elevation. No pleural effusion or pneumothorax. Interstitial thickening is moderate, chronic, and accentuated by low lung volumes and AP portable technique. No lobar consolidation. IMPRESSION: No acute cardiopulmonary disease. Chronic is tissue thickening, accentuated by low lung volumes. Likely the sequelae of smoking/chronic bronchitis. Electronically Signed   By: Abigail Miyamoto M.D.   On: 10/30/2018 05:54    Radiological Exams on Admission: Ct Abdomen Pelvis Wo Contrast  Result Date: 10/30/2018 CLINICAL DATA:  73 year old female with fever of unknown origin and sepsis. EXAM: CT CHEST, ABDOMEN AND PELVIS WITHOUT CONTRAST TECHNIQUE: Multidetector CT imaging of the chest, abdomen and pelvis was performed following the standard protocol without IV contrast. COMPARISON:  None FINDINGS: CT CHEST FINDINGS Cardiovascular: Limited evaluation in the absence of intravenous contrast. Conventional 3 vessel arch anatomy. Mild atherosclerotic plaque along the thoracic aorta. No evidence of aneurysm. Calcifications present throughout the coronary arteries including the left main, left anterior descending, circumflex and right coronary artery. The heart is normal in size. No pericardial effusion. Main pulmonary artery is normal in size. Mediastinum/Nodes: Unremarkable CT appearance of the thyroid gland. No suspicious mediastinal or hilar adenopathy. No soft tissue mediastinal mass. The thoracic esophagus is unremarkable. Lungs/Pleura: Significant respiratory motion artifact limits evaluation for  pulmonary nodules. Moderately severe centrilobular paraseptal emphysema. Diffuse mild bronchial wall thickening. Mild interlobular septal thickening in the upper lungs suggests mild edema. Dependent atelectasis bilaterally. Mild subpleural reticulation and probable architectural distortion in the periphery of the lungs. Musculoskeletal: No acute fracture or aggressive appearing lytic or blastic osseous lesion. CT ABDOMEN PELVIS FINDINGS Hepatobiliary: Normal hepatic contour. No discrete lesion. No biliary ductal dilatation. Calcified stones within the gallbladder lumen. No secondary evidence of acute cholecystitis. Pancreas: Fatty atrophy of the pancreas.  No inflammation or mass. Spleen: Normal in size without focal abnormality. Adrenals/Urinary Tract: Normal adrenal glands. Nonspecific perinephric stranding bilaterally. The kidneys are somewhat indistinct in appearance. No hydronephrosis or nephrolithiasis. There are several circumscribed water attenuation structures which are incompletely evaluated in the absence of IV contrast. Statistically, these are likely simple cysts. The largest, exophytic from the left lower pole measures up to 9 cm in diameter. Unremarkable appearance of the ureters. The bladder is decompressed. Small locule of gas in the bladder dome likely related to recent instrumentation. Stomach/Bowel: No focal bowel wall thickening or evidence of obstruction. The appendix is surgically absent. Vascular/Lymphatic: Limited evaluation in the absence of IV contrast. Fairly extensive atherosclerotic vascular calcifications along the abdominal aorta. No lymphadenopathy. Reproductive: Uterus and bilateral adnexa are unremarkable. Other: No ascites or hernia. Musculoskeletal: No acute fracture or aggressive appearing lytic or blastic osseous lesion. Multilevel degenerative disc disease and lower lumbar facet arthropathy. Mild grade 1 anterolisthesis of L4 on L5. IMPRESSION: 1. Although incompletely  evaluated in  the absence of IV contrast, the kidneys appear slightly indistinct and demonstrate nonspecific perinephric stranding bilaterally. Recommend clinical correlation with urinalysis to assess for urinary tract infection. 2. Due to fairly significant respiratory motion artifact and a background of chronic changes, it is difficult to exclude a subtle infectious/inflammatory process in the lungs. No obvious pneumonia identified. 3. Bilateral low-attenuation renal masses are incompletely evaluated without IV contrast. These are water attenuation and statistically likely to represent benign cysts. 4. Cholelithiasis without secondary imaging evidence to suggest cholecystitis. 5. Pulmonary emphysema and likely chronic bronchial wall thickening consistent with COPD superimposed on a background of peripheral fibrotic changes. 6. Aortic atherosclerosis and multivessel coronary artery disease. 7. Multilevel degenerative disc disease and mid and lower lumbar facet arthropathy. Aortic Atherosclerosis (ICD10-I70.0) and Emphysema (ICD10-J43.9). Electronically Signed   By: Jacqulynn Cadet M.D.   On: 10/30/2018 09:14   Ct Chest Wo Contrast  Result Date: 10/30/2018 CLINICAL DATA:  73 year old female with fever of unknown origin and sepsis. EXAM: CT CHEST, ABDOMEN AND PELVIS WITHOUT CONTRAST TECHNIQUE: Multidetector CT imaging of the chest, abdomen and pelvis was performed following the standard protocol without IV contrast. COMPARISON:  None FINDINGS: CT CHEST FINDINGS Cardiovascular: Limited evaluation in the absence of intravenous contrast. Conventional 3 vessel arch anatomy. Mild atherosclerotic plaque along the thoracic aorta. No evidence of aneurysm. Calcifications present throughout the coronary arteries including the left main, left anterior descending, circumflex and right coronary artery. The heart is normal in size. No pericardial effusion. Main pulmonary artery is normal in size. Mediastinum/Nodes:  Unremarkable CT appearance of the thyroid gland. No suspicious mediastinal or hilar adenopathy. No soft tissue mediastinal mass. The thoracic esophagus is unremarkable. Lungs/Pleura: Significant respiratory motion artifact limits evaluation for pulmonary nodules. Moderately severe centrilobular paraseptal emphysema. Diffuse mild bronchial wall thickening. Mild interlobular septal thickening in the upper lungs suggests mild edema. Dependent atelectasis bilaterally. Mild subpleural reticulation and probable architectural distortion in the periphery of the lungs. Musculoskeletal: No acute fracture or aggressive appearing lytic or blastic osseous lesion. CT ABDOMEN PELVIS FINDINGS Hepatobiliary: Normal hepatic contour. No discrete lesion. No biliary ductal dilatation. Calcified stones within the gallbladder lumen. No secondary evidence of acute cholecystitis. Pancreas: Fatty atrophy of the pancreas.  No inflammation or mass. Spleen: Normal in size without focal abnormality. Adrenals/Urinary Tract: Normal adrenal glands. Nonspecific perinephric stranding bilaterally. The kidneys are somewhat indistinct in appearance. No hydronephrosis or nephrolithiasis. There are several circumscribed water attenuation structures which are incompletely evaluated in the absence of IV contrast. Statistically, these are likely simple cysts. The largest, exophytic from the left lower pole measures up to 9 cm in diameter. Unremarkable appearance of the ureters. The bladder is decompressed. Small locule of gas in the bladder dome likely related to recent instrumentation. Stomach/Bowel: No focal bowel wall thickening or evidence of obstruction. The appendix is surgically absent. Vascular/Lymphatic: Limited evaluation in the absence of IV contrast. Fairly extensive atherosclerotic vascular calcifications along the abdominal aorta. No lymphadenopathy. Reproductive: Uterus and bilateral adnexa are unremarkable. Other: No ascites or hernia.  Musculoskeletal: No acute fracture or aggressive appearing lytic or blastic osseous lesion. Multilevel degenerative disc disease and lower lumbar facet arthropathy. Mild grade 1 anterolisthesis of L4 on L5. IMPRESSION: 1. Although incompletely evaluated in the absence of IV contrast, the kidneys appear slightly indistinct and demonstrate nonspecific perinephric stranding bilaterally. Recommend clinical correlation with urinalysis to assess for urinary tract infection. 2. Due to fairly significant respiratory motion artifact and a background of chronic changes, it is difficult to  exclude a subtle infectious/inflammatory process in the lungs. No obvious pneumonia identified. 3. Bilateral low-attenuation renal masses are incompletely evaluated without IV contrast. These are water attenuation and statistically likely to represent benign cysts. 4. Cholelithiasis without secondary imaging evidence to suggest cholecystitis. 5. Pulmonary emphysema and likely chronic bronchial wall thickening consistent with COPD superimposed on a background of peripheral fibrotic changes. 6. Aortic atherosclerosis and multivessel coronary artery disease. 7. Multilevel degenerative disc disease and mid and lower lumbar facet arthropathy. Aortic Atherosclerosis (ICD10-I70.0) and Emphysema (ICD10-J43.9). Electronically Signed   By: Jacqulynn Cadet M.D.   On: 10/30/2018 09:14   Dg Chest Portable 1 View  Result Date: 10/30/2018 CLINICAL DATA:  Shortness of breath.  Vomiting.  Possible seizure. EXAM: PORTABLE CHEST 1 VIEW COMPARISON:  04/16/2017 FINDINGS: Lower cervical spine fixation. Midline trachea. Normal heart size. Mild right hemidiaphragm elevation. No pleural effusion or pneumothorax. Interstitial thickening is moderate, chronic, and accentuated by low lung volumes and AP portable technique. No lobar consolidation. IMPRESSION: No acute cardiopulmonary disease. Chronic is tissue thickening, accentuated by low lung volumes. Likely the  sequelae of smoking/chronic bronchitis. Electronically Signed   By: Abigail Miyamoto M.D.   On: 10/30/2018 05:54    DVT Prophylaxis -SCD  /heparin AM Labs Ordered, also please review Full Orders  Family Communication: Admission, patients condition and plan of care including tests being ordered have been discussed with the patient and husband who indicate understanding and agree with the plan   Code Status - Full Code  Likely DC to  home  Condition   stable*  Roxan Hockey M.D on 10/30/2018 at 12:43 PM Go to [email or web address] -  for contact info  [facility] - Office  [phone]

## 2018-10-30 NOTE — ED Triage Notes (Signed)
Pt from home arrives via [facility]. Per EMS, family stated pt had "seizure like activity and a period of unconsciousness." Pt C/O body aches and cough. No fever.

## 2018-10-30 NOTE — ED Provider Notes (Signed)
[facility] EMERGENCY DEPARTMENT Provider Note   CSN: [id number] Arrival date & time: 10/30/18  [id number]   Time seen 5:25 AM  History   Chief Complaint Chief Complaint  Patient presents with  . Shortness of Breath    HPI Maria Esparza is a 73 y.o. female.     HPI patient presents from home for possible seizure activity.  When I asked the patient what happened tonight she is unable to express what happened however 5 start aspirin are yes or no question she is able to answer me appropriately.  She states this evening she started having chills and states "I was freezing".  I suspect this may have been when her family thought she had a seizure.  She denies chest pain, cough shortness of breath, sore throat, rhinorrhea, abdominal pain.  She states she has a mild cough and she did have nausea today with one episode of vomiting this evening.  She states she has been having diarrhea for a few weeks and she has an appointment to see her doctor on Tuesday, September 1 to evaluate her diarrhea.  She denies being around anybody else who is ill.  PCP Celene Squibb, MD   Past Medical History:  Diagnosis Date  . Anemia   . Arthritis   . Asthma    mild  . Cough   . Diabetes mellitus type 2, uncomplicated (HCC)    x 10 yrs  . Diabetic neuropathy ([place]) rt foot  . Headache(784.0)   . Hypercholesteremia   . Hyperlipidemia   . Hypertension   . Hypertriglyceridemia 2002  . IBS (irritable bowel syndrome)   . Neuropathy   . Seizures [facility])     Patient Active Problem List   Diagnosis Date Noted  . Diabetes mellitus 05/10/2015  . Diabetes ([place]) 05/02/2014  . Diabetic neuropathy ([place]) 05/22/2012  . Diarrhea 03/31/2012  . Hypertension   . Hypercholesteremia   . Hyperlipidemia   . [id number])     Past Surgical History:  Procedure Laterality Date  . APPENDECTOMY    . BREAST SURGERY  1999   left breast biopsy  . CERVICAL SPINE SURGERY  december 2003  . [place]   for  unknown reason.  They thought she had a mass.  . COLONOSCOPY  12/04/2010   Procedure: COLONOSCOPY;  Surgeon: Rogene Houston, MD;  Location: [facility];  Service: Endoscopy;  Laterality: N/A;  3:00  . COLONOSCOPY N/A 01/18/2015   Procedure: COLONOSCOPY;  Surgeon: Rogene Houston, MD;  Location: [facility] END[facility] SUITE;  Service: Endoscopy;  Laterality: N/A;  955 - moved to 10:40 - Ann notified pt     OB History   No obstetric history on file.      Home Medications    Prior to Admission medications   Medication Sig Start Date End Date Taking? Authorizing Provider  aspirin EC 81 MG tablet Take 81 mg by mouth daily.      [provider]  calcium carbonate (OS-CAL) 600 MG TABS Take 600 mg by mouth 2 (two) times daily before a meal. With Vitamin D     [provider]  diclofenac (VOLTAREN) 75 MG EC tablet Take 75 mg by mouth 2 (two) times daily.    [provider]  Ferrous Sulfate (IRON PO) Take 65 mg by mouth daily.    [provider]  fish oil-omega-3 fatty acids 1000 MG capsule Take 1 g by mouth daily.  [provider]  gabapentin (NEURONTIN) 300 MG capsule Take 1 capsule (300 mg total) by mouth 3 (three) times daily. 10/04/18   Penumalli, Earlean Polka, MD  Insulin Degludec (TRESIBA) 100 UNIT/ML SOLN Inject 36 Units into the skin.    [provider]  levETIRAcetam (KEPPRA) 500 MG tablet Take 1 tablet (500 mg total) by mouth 2 (two) times daily. 07/13/18   Penumalli, Earlean Polka, MD  losartan (COZAAR) 50 MG tablet TAKE 1 TABLET(50 MG) BY MOUTH DAILY Patient not taking: Reported on 07/12/2018 06/24/15   Mikey Kirschner, MD  metFORMIN (GLUCOPHAGE) 500 MG tablet 1 TABLET BY MOUTH TWICE DAILY WITH MEALS 02/18/16   Mikey Kirschner, MD  Multiple Vitamins-Minerals (CENTRUM SILVER PO) Take by mouth.    [provider]  naproxen (NAPROSYN) 500 MG tablet TAKE 1 TABLET(500 MG) BY MOUTH TWICE DAILY WITH A MEAL Patient not taking: Reported on  07/12/2018 12/30/15   Sanjuana Kava, MD  oxybutynin (DITROPAN) 5 MG tablet Take 5 mg by mouth 3 (three) times daily.    [provider]  pioglitazone (ACTOS) 30 MG tablet Take 30 mg by mouth daily.    [provider]  rosuvastatin (CRESTOR) 10 MG tablet Take 10 mg by mouth daily. 07/09/18   [provider]    Family History Family History  Adopted: Yes    Social History Social History   Tobacco Use  . Smoking status: Former Smoker    Packs/day: 3.00    Years: 20.00    Pack years: 60.00  . Smokeless tobacco: Never Used  . Tobacco comment: Quit smoking in early 80s after smoking 20 yrs,.  Substance Use Topics  . Alcohol use: Yes    Alcohol/week: 0.0 standard drinks    Comment: very rare occasion on a holiday  . Drug use: No  lives at home  Lives with spouse   Allergies   Penicillins and Prinivil [lisinopril]   Review of Systems Review of Systems  All other systems reviewed and are negative.    Physical Exam Updated Vital Signs BP 128/67   Pulse 96   Temp (!) 102.8 F (39.3 C) (Rectal)   Resp (!) 23   Wt 113.5 kg   SpO2 97%   BMI 37.49 kg/m   Physical Exam Vitals signs and nursing note reviewed.  Constitutional:      General: She is not in acute distress.    Appearance: Normal appearance. She is well-developed. She is obese. She is not ill-appearing or toxic-appearing.  HENT:     Head: Normocephalic and atraumatic.     Right Ear: External ear normal.     Left Ear: External ear normal.     Nose: Nose normal. No mucosal edema or rhinorrhea.     Mouth/Throat:     Dentition: No dental abscesses.     Pharynx: No uvula swelling.  Eyes:     Conjunctiva/sclera: Conjunctivae normal.     Pupils: Pupils are equal, round, and reactive to light.  Neck:     Musculoskeletal: Full passive range of motion without pain, normal range of motion and neck supple.  Cardiovascular:     Rate and Rhythm: Normal rate and regular rhythm.     Heart  sounds: Normal heart sounds. No murmur. No friction rub. No gallop.   Pulmonary:     Effort: Pulmonary effort is normal. No respiratory distress.     Breath sounds: Normal breath sounds. No wheezing, rhonchi or rales.  Chest:  Chest wall: No tenderness or crepitus.  Abdominal:     General: Bowel sounds are normal. There is no distension.     Palpations: Abdomen is soft.     Tenderness: There is no abdominal tenderness. There is no guarding or rebound.  Musculoskeletal: Normal range of motion.        General: No tenderness.     Comments: Moves all extremities well.   Skin:    General: Skin is warm and dry.     Coloration: Skin is not pale.     Findings: No erythema or rash.  Neurological:     General: No focal deficit present.     Mental Status: She is alert. She is disoriented.     Cranial Nerves: No cranial nerve deficit.  Psychiatric:        Mood and Affect: Mood normal. Mood is not anxious.        Speech: Speech normal.        Behavior: Behavior normal.      ED Treatments / Results  Labs (all labs ordered are listed, but only abnormal results are displayed) Results for orders placed or performed during the hospital encounter of 10/30/18  CBC with Differential  Result Value Ref Range   WBC 27.6 (H) 4.0 - 10.5 K/uL   RBC 4.46 3.87 - 5.11 MIL/uL   Hemoglobin 13.1 12.0 - 15.0 g/dL   HCT 41.8 36.0 - 46.0 %   MCV 93.7 80.0 - 100.0 fL   MCH 29.4 26.0 - 34.0 pg   MCHC 31.3 30.0 - 36.0 g/dL   RDW 14.0 11.5 - 15.5 %   Platelets 353 150 - 400 K/uL   nRBC 0.0 0.0 - 0.2 %   Neutrophils Relative % 89 %   Neutro Abs 24.8 (H) 1.7 - 7.7 K/uL   Lymphocytes Relative 5 %   Lymphs Abs 1.3 0.7 - 4.0 K/uL   Monocytes Relative 5 %   Monocytes Absolute 1.3 (H) 0.1 - 1.0 K/uL   Eosinophils Relative 0 %   Eosinophils Absolute 0.0 0.0 - 0.5 K/uL   Basophils Relative 0 %   Basophils Absolute 0.1 0.0 - 0.1 K/uL   WBC Morphology DOHLE BODIES    Immature Granulocytes 1 %   Abs Immature  Granulocytes 0.17 (H) 0.00 - 0.07 K/uL  Comprehensive metabolic panel  Result Value Ref Range   Sodium 138 135 - 145 mmol/L   Potassium 3.8 3.5 - 5.1 mmol/L   Chloride 106 98 - 111 mmol/L   CO2 19 (L) 22 - 32 mmol/L   Glucose, Bld 171 (H) 70 - 99 mg/dL   BUN 22 8 - 23 mg/dL   Creatinine, Ser 1.45 (H) 0.44 - 1.00 mg/dL   Calcium 9.0 8.9 - 10.3 mg/dL   Total Protein 7.0 6.5 - 8.1 g/dL   Albumin 3.5 3.5 - 5.0 g/dL   AST 48 (H) 15 - 41 U/L   ALT 32 0 - 44 U/L   Alkaline Phosphatase 63 38 - 126 U/L   Total Bilirubin 0.5 0.3 - 1.2 mg/dL   GFR calc non Af Amer 35 (L) >60 mL/min   GFR calc Af Amer 41 (L) >60 mL/min   Anion gap 13 5 - 15  Lactic acid, plasma  Result Value Ref Range   Lactic Acid, Venous 4.3 (HH) 0.5 - 1.9 mmol/L  APTT  Result Value Ref Range   aPTT 34 24 - 36 seconds  Protime-INR  Result Value Ref Range  Prothrombin Time 14.8 11.4 - 15.2 seconds   INR 1.2 0.8 - 1.2   Laboratory interpretation all normal except leukocytosis    EKG EKG Interpretation  Date/Time:  Sunday October 30 2018 04:40:01 EDT Ventricular Rate:  115 PR Interval:    QRS Duration: 155 QT Interval:  357 QTC Calculation: 494 R Axis:   -54 Text Interpretation:  Sinus tachycardia Since last tracing 25 Jan 2002 Left bundle branch block is now present Confirmed by Rolland Porter [phone]) on 10/30/2018 5:17:13 AM   Radiology Dg Chest Portable 1 View  Result Date: 10/30/2018 CLINICAL DATA:  Shortness of breath.  Vomiting.  Possible seizure. EXAM: PORTABLE CHEST 1 VIEW COMPARISON:  04/16/2017 FINDINGS: Lower cervical spine fixation. Midline trachea. Normal heart size. Mild right hemidiaphragm elevation. No pleural effusion or pneumothorax. Interstitial thickening is moderate, chronic, and accentuated by low lung volumes and AP portable technique. No lobar consolidation. IMPRESSION: No acute cardiopulmonary disease. Chronic is tissue thickening, accentuated by low lung volumes. Likely the sequelae of  smoking/chronic bronchitis. Electronically Signed   By: Abigail Miyamoto M.D.   On: 10/30/2018 05:54    Procedures .Critical Care Performed by: Rolland Porter, MD Authorized by: Rolland Porter, MD   Critical care provider statement:    Critical care time (minutes):  31   Critical care was necessary to treat or prevent imminent or life-threatening deterioration of the following conditions:  Sepsis   Critical care was time spent personally by me on the following activities:  Examination of patient, obtaining history from patient or surrogate, ordering and review of laboratory studies, ordering and review of radiographic studies, pulse oximetry and re-evaluation of patient's condition   (including critical care time)  Medications Ordered in ED Medications  metroNIDAZOLE (FLAGYL) IVPB 500 mg (has no administration in time range)  aztreonam (AZACTAM) 2 g in sodium chloride 0.9 % 100 mL IVPB (has no administration in time range)  vancomycin (VANCOCIN) 2,500 mg in sodium chloride 0.9 % 500 mL IVPB (has no administration in time range)  sodium chloride 0.9 % bolus 1,000 mL (0 mLs Intravenous Stopped 10/30/18 0639)    And  sodium chloride 0.9 % bolus 1,000 mL (1,000 mLs Intravenous New Bag/Given 10/30/18 [id number])    And  sodium chloride 0.9 % bolus 1,000 mL (1,000 mLs Intravenous New Bag/Given 10/30/18 0639)  acetaminophen (TYLENOL) suppository 650 mg (650 mg Rectal Given 10/30/18 [id number])     Initial Impression / Assessment and Plan / ED Course  I have reviewed the triage vital signs and the nursing notes.  Pertinent labs & imaging results that were available during my care of the patient were reviewed by me and considered in my medical decision making (see chart for details).    Patient was started on sepsis protocol without calling code sepsis.  She was given a 30 cc/kg bolus.  Her fever was treated with acetaminophen.  Patient's chest x-ray does not show obvious pneumonia.  However her white blood cell  count returned and was only 28,000, she was given IV antibiotics for unknown source for now.  At 6:43 AM the rest of her labs are still pending.  Pt turned over to Dr Thurnell Garbe for disposition.  Final Clinical Impressions(s) / ED Diagnoses   Final diagnoses:  Fever, unspecified fever cause    Disposition pending  Rolland Porter, MD, Barbette Or, MD 10/30/18 [phone]

## 2018-10-30 NOTE — Progress Notes (Signed)
ANTIBIOTIC CONSULT NOTE-Preliminary  Pharmacy Consult for Aztreonam and Vancomycin Indication: Unknown source  Allergies  Allergen Reactions  . Penicillins Other (See Comments)    Vomiting, ge 10 Can take cephal   . Prinivil [Lisinopril] Cough    Patient Measurements: Weight: 250 lb 3.6 oz (113.5 kg)   Vital Signs: Temp: 102.8 F (39.3 C) (08/30 0520) Temp Source: Rectal (08/30 0520) BP: 128/67 (08/30 0630) Pulse Rate: 96 (08/30 0630)  Labs: Recent Labs    10/30/18 0558  WBC 27.6*  HGB 13.1  PLT 353  CREATININE 1.45*    Estimated Creatinine Clearance: 45.4 mL/min (A) (by C-G formula based on SCr of 1.45 mg/dL (H)).  No results for input(s): VANCOTROUGH, VANCOPEAK, VANCORANDOM, GENTTROUGH, GENTPEAK, GENTRANDOM, TOBRATROUGH, TOBRAPEAK, TOBRARND, AMIKACINPEAK, AMIKACINTROU, AMIKACIN in the last 72 hours.   Microbiology: No results found for this or any previous visit (from the past 720 hour(s)).  Medical History: Past Medical History:  Diagnosis Date  . Anemia   . Arthritis   . Asthma    mild  . Cough   . Diabetes mellitus type 2, uncomplicated (HCC)    x 10 yrs  . Diabetic neuropathy ([place]) rt foot  . Headache(784.0)   . Hypercholesteremia   . Hyperlipidemia   . Hypertension   . Hypertriglyceridemia 2002  . IBS (irritable bowel syndrome)     Medications:  Home medications pending review  Assessment: 73yo F presented to ED with "seizure like activity and loss of consciousness". Pt c/o of coughing and body aches  Goal of Therapy:  Vanc trough 15-20 unless AUC determined to be clinically relevant Plan:  Aztreonam 2 gram IV q8h Vancomycin 2500mg  IV x 1  Preliminary review of pertinent patient information completed.  Protocol will be initiated with dose(s) of Vancomycin.  Forestine Na clinical pharmacist will complete review during morning rounds to assess patient and finalize treatment regimen if needed.  Vernie Ammons, RPH 10/30/2018,7:11 AM

## 2018-10-30 NOTE — ED Notes (Addendum)
CRITICAL VALUE ALERT  Critical Value: Lactic Acid 4.3  Date & Time Notied: 10/30/2018 [id number]  Provider Notified: Dr. Tomi Bamberger  Orders Received/Actions taken: n/a

## 2018-10-31 DIAGNOSIS — N10 Acute tubulo-interstitial nephritis: Secondary | ICD-10-CM

## 2018-10-31 LAB — BASIC METABOLIC PANEL
Anion gap: 9 (ref 5–15)
BUN: 13 mg/dL (ref 8–23)
CO2: 22 mmol/L (ref 22–32)
Calcium: 8.1 mg/dL — ABNORMAL LOW (ref 8.9–10.3)
Chloride: 108 mmol/L (ref 98–111)
Creatinine, Ser: 0.9 mg/dL (ref 0.44–1.00)
GFR calc Af Amer: >60 mL/min (ref >60)
GFR calc non Af Amer: >60 mL/min (ref >60)
Glucose, Bld: 132 mg/dL — ABNORMAL HIGH (ref 70–99)
Potassium: 3.8 mmol/L (ref 3.5–5.1)
Sodium: 139 mmol/L (ref 135–145)

## 2018-10-31 LAB — GLUCOSE, CAPILLARY
Glucose-Capillary: 109 mg/dL — ABNORMAL HIGH (ref 70–99)
Glucose-Capillary: 146 mg/dL — ABNORMAL HIGH (ref 70–99)
Glucose-Capillary: 88 mg/dL (ref 70–99)
Glucose-Capillary: 144 mg/dL — ABNORMAL HIGH (ref 70–99)

## 2018-10-31 LAB — CBC
HCT: 35.5 % — ABNORMAL LOW (ref 36.0–46.0)
Hemoglobin: 10.9 g/dL — ABNORMAL LOW (ref 12.0–15.0)
MCH: 29.2 pg (ref 26.0–34.0)
MCHC: 30.7 g/dL (ref 30.0–36.0)
MCV: 95.2 fL (ref 80.0–100.0)
Platelets: 265 10*3/uL (ref 150–400)
RBC: 3.73 MIL/uL — ABNORMAL LOW (ref 3.87–5.11)
RDW: 14.5 % (ref 11.5–15.5)
WBC: 13.6 10*3/uL — ABNORMAL HIGH (ref 4.0–10.5)
nRBC: 0 % (ref 0.0–0.2)

## 2018-10-31 MED ORDER — ALBUTEROL SULFATE (2.5 MG/3ML) 0.083% IN NEBU: 2.5000 mg | INHALATION_SOLUTION | Freq: Four times a day (QID) | RESPIRATORY_TRACT | Status: DC | PRN

## 2018-10-31 NOTE — Progress Notes (Signed)
Patient Demographics:    Maria Cornish, is a 73 y.o. female, DOB - 1945/03/05, [id number]  Admit date - 10/30/2018   Admitting Physician Clydean Posas Denton Brick, MD  Outpatient Primary MD for the patient is Celene Squibb, MD  LOS - 1   Chief Complaint  Patient presents with   Shortness of Breath        Subjective:    [facility] today has no fevers, no emesis,  No chest pain, patient complains of urinary frequency, no dysuria, has back pain but no flank pain  Assessment  & Plan :    Principal Problem:   Sepsis from Urinary Source Active Problems:   Acute pyelonephritis   Hypertension   Diarrhea   DM (diabetes mellitus) ([place])  Brief summary 73 y.o. female with past medical history relevant for HTN, obesity, DM, possible seizure disorder (on Keppra), history of asthma/COPD and HLD admitted on 10/30/2018 with fever and shaking chills and found to have sepsis from urinary source    A/p 1)Sepsis secondary to urinary source----patient had fevers with temp up to 102.8  on admission,  -Fevers, tachycardia and tachypnea appears to have resolved  - WBC is down to 13.1 from 27.6 =-Patient initially received vancomycin, Flagyl and cefepime in the ED -Given urinary source for infection switched over to IV Rocephin pending blood and urine cultures C/n VF  2)Acute pyelonephritis--  UA strongly suggestive of UTI, CT abdomen and pelvis with findings of perinephric stranding bilaterally most likely suggestive of pyelonephritis- -continue IV Rocephin pending cultures  3)COPD/emphysema--- no acute exacerbation, continue bronchodilators  4)HTN--stable, continue to hold losartan due to kidney concerns,   may use IV Hydralazine 10 mg  Every 4 hours Prn for systolic blood pressure over 160 mmhg  5)DM2-hold metformin due to kidney concerns, c/n Actos -Use lantus at reduced dose of 25 units daily -Use  Novolog/Humalog Sliding scale insulin with Accu-Cheks /Fingersticks as ordered  6)AKI----acute kidney injury suspect due to UTI and dehydration   creatinine on admission= 1.45 ,   baseline creatinine = 1.0    ,   AKI has resolved, creatinine is back to 0.9    , renally adjust medications, avoid nephrotoxic agents / dehydration /hypotension -Hold losartan, hold metformin, avoid NSAIDs  7)cholelithiasis without evidence for acute cholecystitis--asymptomatic, expectant management  8) prior history of possible seizures--doubt that patient actually had  seizure this time around suspect patient had shaking chills and rigors from UTI/pyelonephritis rather than frank seizures -Continue home dose Keppra  9)Diarrhea--patient has had diarrhea for about 2 weeks now, PCP previously ordered stool studies will get those while patient is here-defer C. difficile and GI pathogen pending -As per patient diarrhea appears to be resolving spontaneously at this time -Actually she is unable to give stool sample at this time  Disposition/Need for in-Hospital Stay- patient unable to be discharged at this time due to sepsis from urinary source requiring IV antibiotics pending culture results  Code Status : Full  Family Communication:   (patient is alert, awake and coherent) Discussed with Husband  Disposition Plan  : TBD  Consults  :  NA  DVT Prophylaxis  :    Heparin - SCDs   Lab Results  Component Value Date   PLT 265  10/31/2018    Inpatient Medications  Scheduled Meds:  aspirin EC  81 mg Oral Daily   calcium-vitamin D  1 tablet Oral BID AC   ferrous sulfate  325 mg Oral Q breakfast   gabapentin  300 mg Oral TID   heparin  5,000 Units Subcutaneous Q8H   insulin aspart  0-5 Units Subcutaneous QHS   insulin aspart  0-9 Units Subcutaneous TID WC   insulin glargine  25 Units Subcutaneous QHS   levETIRAcetam  500 mg Oral BID   omega-3 acid ethyl esters  1 g Oral Daily   oxybutynin  5 mg  Oral TID   pioglitazone  30 mg Oral Daily   rosuvastatin  10 mg Oral Daily   sodium chloride flush  3 mL Intravenous Q12H   Continuous Infusions:  sodium chloride     sodium chloride 100 mL/hr at 10/31/18 0805   cefTRIAXone (ROCEPHIN)  IV 2 g (10/30/18 1400)   PRN Meds:.sodium chloride, acetaminophen **OR** acetaminophen, albuterol, hydrALAZINE, ondansetron **OR** ondansetron (ZOFRAN) IV, polyethylene glycol, sodium chloride flush, traZODone   Anti-infectives (From admission, onward)   Start     Dose/Rate Route Frequency Ordered Stop   10/30/18 1600  ceFEPIme (MAXIPIME) 2 g in sodium chloride 0.9 % 100 mL IVPB  Status:  Discontinued     2 g 200 mL/hr over 30 Minutes Intravenous Every 12 hours 10/30/18 0845 10/30/18 0928   10/30/18 1400  cefTRIAXone (ROCEPHIN) 2 g in sodium chloride 0.9 % 100 mL IVPB     2 g 200 mL/hr over 30 Minutes Intravenous Every 24 hours 10/30/18 0928     10/30/18 1000  vancomycin (VANCOCIN) IVPB 1000 mg/200 mL premix  Status:  Discontinued     1,000 mg 200 mL/hr over 60 Minutes Intravenous  Once 10/30/18 0816 10/30/18 0928   10/30/18 0900  vancomycin (VANCOCIN) IVPB 1000 mg/200 mL premix  Status:  Discontinued     1,000 mg 200 mL/hr over 60 Minutes Intravenous  Once 10/30/18 0816 10/30/18 0928   10/30/18 0800  vancomycin (VANCOCIN) 2,500 mg in sodium chloride 0.9 % 500 mL IVPB  Status:  Discontinued     2,500 mg 250 mL/hr over 120 Minutes Intravenous  Once 10/30/18 0709 10/30/18 0817   10/30/18 0730  aztreonam (AZACTAM) 2 g in sodium chloride 0.9 % 100 mL IVPB  Status:  Discontinued     2 g 200 mL/hr over 30 Minutes Intravenous Every 8 hours 10/30/18 0707 10/30/18 0841   10/30/18 0645  aztreonam (AZACTAM) 2 g in sodium chloride 0.9 % 100 mL IVPB  Status:  Discontinued     2 g 200 mL/hr over 30 Minutes Intravenous  Once 10/30/18 0642 10/30/18 0707   10/30/18 0645  metroNIDAZOLE (FLAGYL) IVPB 500 mg     500 mg 100 mL/hr over 60 Minutes Intravenous   Once 10/30/18 0642 10/30/18 0913   10/30/18 0645  vancomycin (VANCOCIN) IVPB 1000 mg/200 mL premix  Status:  Discontinued     1,000 mg 200 mL/hr over 60 Minutes Intravenous  Once 10/30/18 0642 10/30/18 0709        Objective:   Vitals:   10/30/18 1021 10/30/18 1648 10/30/18 2100 10/31/18 0601  BP:  137/68 (!) 151/74 104/73  Pulse:  90 88 80  Resp:  20 18 20   Temp:   99.1 F (37.3 C) 99.3 F (37.4 C)  TempSrc:   Oral Oral  SpO2:   94% 96%  Weight: 114.4 kg     Height:  5\' 8"  (1.727 m)       Wt Readings from Last 3 Encounters:  10/30/18 114.4 kg  10/04/18 113.5 kg  01/09/16 101.8 kg    Intake/Output Summary (Last 24 hours) at 10/31/2018 0944 Last data filed at 10/31/2018 0300 Gross per 24 hour  Intake 1783.81 ml  Output 1100 ml  Net 683.81 ml     Physical Exam  Physical Examination: General appearance - alert, obese,  in no distress a Mental status -  awake and  oriented at this time  eyes - sclera anicteric Neck - supple, no JVD elevation , Chest -diminished in bases without wheezing Heart - S1 and S2 normal, regular ,  Abdomen - soft, nontender, nondistended,  No flank/CVA area tenderness, increased truncal adiposity noted  neurological - screening mental status exam normal, neck supple without rigidity, cranial nerves II through XII intact, DTR's normal and symmetric Extremities - no pedal edema noted, intact peripheral pulses  Skin - warm, dry   Data Review:   Micro Results Recent Results (from the past 240 hour(s))  SARS Coronavirus 2 [facility] order, Performed in [place] hospital lab) Nasopharyngeal Nasopharyngeal Swab     Status: None   Collection Time: 10/30/18  5:27 AM   Specimen: Nasopharyngeal Swab  Result Value Ref Range Status   SARS Coronavirus 2 NEGATIVE NEGATIVE Final    Comment: (NOTE) If result is NEGATIVE SARS-CoV-2 target nucleic acids are NOT DETECTED. The SARS-CoV-2 RNA is generally detectable in upper and lower  respiratory  specimens during the acute phase of infection. The lowest  concentration of SARS-CoV-2 viral copies this assay can detect is 250  copies / mL. A negative result does not preclude SARS-CoV-2 infection  and should not be used as the sole basis for treatment or other  patient management decisions.  A negative result may occur with  improper specimen collection / handling, submission of specimen other  than nasopharyngeal swab, presence of viral mutation(s) within the  areas targeted by this assay, and inadequate number of viral copies  (<250 copies / mL). A negative result must be combined with clinical  observations, patient history, and epidemiological information. If result is POSITIVE SARS-CoV-2 target nucleic acids are DETECTED. The SARS-CoV-2 RNA is generally detectable in upper and lower  respiratory specimens dur ing the acute phase of infection.  Positive  results are indicative of active infection with SARS-CoV-2.  Clinical  correlation with patient history and other diagnostic information is  necessary to determine patient infection status.  Positive results do  not rule out bacterial infection or co-infection with other viruses. If result is PRESUMPTIVE POSTIVE SARS-CoV-2 nucleic acids MAY BE PRESENT.   A presumptive positive result was obtained on the submitted specimen  and confirmed on repeat testing.  While 2019 novel coronavirus  (SARS-CoV-2) nucleic acids may be present in the submitted sample  additional confirmatory testing may be necessary for epidemiological  and / or clinical management purposes  to differentiate between  SARS-CoV-2 and other Sarbecovirus currently known to infect humans.  If clinically indicated additional testing with an alternate test  methodology [phone]) is advised. The SARS-CoV-2 RNA is generally  detectable in upper and lower respiratory sp ecimens during the acute  phase of infection. The expected result is Negative. Fact Sheet for  Patients:  StrictlyIdeas.no Fact Sheet for Healthcare Providers: BankingDealers.co.za This test is not yet approved or cleared by the [place] FDA and has been authorized for detection and/or diagnosis of SARS-CoV-2 by FDA under  an Emergency Use Authorization (EUA).  This EUA will remain in effect (meaning this test can be used) for the duration of the COVID-19 declaration under Section 564(b)(1) of the Act, 21 U.S.C. section 360bbb-3(b)(1), unless the authorization is terminated or revoked sooner. Performed at [facility], [address], [place], [place] [address]   Blood Culture (routine x 2)     Status: None (Preliminary result)   Collection Time: 10/30/18  5:58 AM   Specimen: BLOOD LEFT HAND  Result Value Ref Range Status   Specimen Description   Final    BLOOD LEFT HAND BOTTLES DRAWN AEROBIC AND ANAEROBIC   Special Requests Blood Culture adequate volume  Final   Culture   Final    NO GROWTH 1 DAY Performed at [facility], [address], [place], [place] [address]    Report Status PENDING  Incomplete  Blood Culture (routine x 2)     Status: None (Preliminary result)   Collection Time: 10/30/18  6:00 AM   Specimen: Right Antecubital; Blood  Result Value Ref Range Status   Specimen Description   Final    RIGHT ANTECUBITAL BOTTLES DRAWN AEROBIC AND ANAEROBIC   Special Requests Blood Culture adequate volume  Final   Culture   Final    NO GROWTH 1 DAY Performed at [facility], [address], [place],  [address]    Report Status PENDING  Incomplete    Radiology Reports Ct Abdomen Pelvis Wo Contrast  Result Date: 10/30/2018 CLINICAL DATA:  73 year old female with fever of unknown origin and sepsis. EXAM: CT CHEST, ABDOMEN AND PELVIS WITHOUT CONTRAST TECHNIQUE: Multidetector CT imaging of the chest, abdomen and pelvis was performed following the standard protocol without IV contrast. COMPARISON:  None FINDINGS: CT CHEST  FINDINGS Cardiovascular: Limited evaluation in the absence of intravenous contrast. Conventional 3 vessel arch anatomy. Mild atherosclerotic plaque along the thoracic aorta. No evidence of aneurysm. Calcifications present throughout the coronary arteries including the left main, left anterior descending, circumflex and right coronary artery. The heart is normal in size. No pericardial effusion. Main pulmonary artery is normal in size. Mediastinum/Nodes: Unremarkable CT appearance of the thyroid gland. No suspicious mediastinal or hilar adenopathy. No soft tissue mediastinal mass. The thoracic esophagus is unremarkable. Lungs/Pleura: Significant respiratory motion artifact limits evaluation for pulmonary nodules. Moderately severe centrilobular paraseptal emphysema. Diffuse mild bronchial wall thickening. Mild interlobular septal thickening in the upper lungs suggests mild edema. Dependent atelectasis bilaterally. Mild subpleural reticulation and probable architectural distortion in the periphery of the lungs. Musculoskeletal: No acute fracture or aggressive appearing lytic or blastic osseous lesion. CT ABDOMEN PELVIS FINDINGS Hepatobiliary: Normal hepatic contour. No discrete lesion. No biliary ductal dilatation. Calcified stones within the gallbladder lumen. No secondary evidence of acute cholecystitis. Pancreas: Fatty atrophy of the pancreas.  No inflammation or mass. Spleen: Normal in size without focal abnormality. Adrenals/Urinary Tract: Normal adrenal glands. Nonspecific perinephric stranding bilaterally. The kidneys are somewhat indistinct in appearance. No hydronephrosis or nephrolithiasis. There are several circumscribed water attenuation structures which are incompletely evaluated in the absence of IV contrast. Statistically, these are likely simple cysts. The largest, exophytic from the left lower pole measures up to 9 cm in diameter. Unremarkable appearance of the ureters. The bladder is decompressed.  Small locule of gas in the bladder dome likely related to recent instrumentation. Stomach/Bowel: No focal bowel wall thickening or evidence of obstruction. The appendix is surgically absent. Vascular/Lymphatic: Limited evaluation in the absence of IV contrast. Fairly extensive atherosclerotic vascular calcifications  along the abdominal aorta. No lymphadenopathy. Reproductive: Uterus and bilateral adnexa are unremarkable. Other: No ascites or hernia. Musculoskeletal: No acute fracture or aggressive appearing lytic or blastic osseous lesion. Multilevel degenerative disc disease and lower lumbar facet arthropathy. Mild grade 1 anterolisthesis of L4 on L5. IMPRESSION: 1. Although incompletely evaluated in the absence of IV contrast, the kidneys appear slightly indistinct and demonstrate nonspecific perinephric stranding bilaterally. Recommend clinical correlation with urinalysis to assess for urinary tract infection. 2. Due to fairly significant respiratory motion artifact and a background of chronic changes, it is difficult to exclude a subtle infectious/inflammatory process in the lungs. No obvious pneumonia identified. 3. Bilateral low-attenuation renal masses are incompletely evaluated without IV contrast. These are water attenuation and statistically likely to represent benign cysts. 4. Cholelithiasis without secondary imaging evidence to suggest cholecystitis. 5. Pulmonary emphysema and likely chronic bronchial wall thickening consistent with COPD superimposed on a background of peripheral fibrotic changes. 6. Aortic atherosclerosis and multivessel coronary artery disease. 7. Multilevel degenerative disc disease and mid and lower lumbar facet arthropathy. Aortic Atherosclerosis (ICD10-I70.0) and Emphysema (ICD10-J43.9). Electronically Signed   By: Jacqulynn Cadet M.D.   On: 10/30/2018 09:14   Ct Chest Wo Contrast  Result Date: 10/30/2018 CLINICAL DATA:  73 year old female with fever of unknown origin and  sepsis. EXAM: CT CHEST, ABDOMEN AND PELVIS WITHOUT CONTRAST TECHNIQUE: Multidetector CT imaging of the chest, abdomen and pelvis was performed following the standard protocol without IV contrast. COMPARISON:  None FINDINGS: CT CHEST FINDINGS Cardiovascular: Limited evaluation in the absence of intravenous contrast. Conventional 3 vessel arch anatomy. Mild atherosclerotic plaque along the thoracic aorta. No evidence of aneurysm. Calcifications present throughout the coronary arteries including the left main, left anterior descending, circumflex and right coronary artery. The heart is normal in size. No pericardial effusion. Main pulmonary artery is normal in size. Mediastinum/Nodes: Unremarkable CT appearance of the thyroid gland. No suspicious mediastinal or hilar adenopathy. No soft tissue mediastinal mass. The thoracic esophagus is unremarkable. Lungs/Pleura: Significant respiratory motion artifact limits evaluation for pulmonary nodules. Moderately severe centrilobular paraseptal emphysema. Diffuse mild bronchial wall thickening. Mild interlobular septal thickening in the upper lungs suggests mild edema. Dependent atelectasis bilaterally. Mild subpleural reticulation and probable architectural distortion in the periphery of the lungs. Musculoskeletal: No acute fracture or aggressive appearing lytic or blastic osseous lesion. CT ABDOMEN PELVIS FINDINGS Hepatobiliary: Normal hepatic contour. No discrete lesion. No biliary ductal dilatation. Calcified stones within the gallbladder lumen. No secondary evidence of acute cholecystitis. Pancreas: Fatty atrophy of the pancreas.  No inflammation or mass. Spleen: Normal in size without focal abnormality. Adrenals/Urinary Tract: Normal adrenal glands. Nonspecific perinephric stranding bilaterally. The kidneys are somewhat indistinct in appearance. No hydronephrosis or nephrolithiasis. There are several circumscribed water attenuation structures which are incompletely  evaluated in the absence of IV contrast. Statistically, these are likely simple cysts. The largest, exophytic from the left lower pole measures up to 9 cm in diameter. Unremarkable appearance of the ureters. The bladder is decompressed. Small locule of gas in the bladder dome likely related to recent instrumentation. Stomach/Bowel: No focal bowel wall thickening or evidence of obstruction. The appendix is surgically absent. Vascular/Lymphatic: Limited evaluation in the absence of IV contrast. Fairly extensive atherosclerotic vascular calcifications along the abdominal aorta. No lymphadenopathy. Reproductive: Uterus and bilateral adnexa are unremarkable. Other: No ascites or hernia. Musculoskeletal: No acute fracture or aggressive appearing lytic or blastic osseous lesion. Multilevel degenerative disc disease and lower lumbar facet arthropathy. Mild grade 1 anterolisthesis of L4  on L5. IMPRESSION: 1. Although incompletely evaluated in the absence of IV contrast, the kidneys appear slightly indistinct and demonstrate nonspecific perinephric stranding bilaterally. Recommend clinical correlation with urinalysis to assess for urinary tract infection. 2. Due to fairly significant respiratory motion artifact and a background of chronic changes, it is difficult to exclude a subtle infectious/inflammatory process in the lungs. No obvious pneumonia identified. 3. Bilateral low-attenuation renal masses are incompletely evaluated without IV contrast. These are water attenuation and statistically likely to represent benign cysts. 4. Cholelithiasis without secondary imaging evidence to suggest cholecystitis. 5. Pulmonary emphysema and likely chronic bronchial wall thickening consistent with COPD superimposed on a background of peripheral fibrotic changes. 6. Aortic atherosclerosis and multivessel coronary artery disease. 7. Multilevel degenerative disc disease and mid and lower lumbar facet arthropathy. Aortic Atherosclerosis  (ICD10-I70.0) and Emphysema (ICD10-J43.9). Electronically Signed   By: Jacqulynn Cadet M.D.   On: 10/30/2018 09:14   US Soft Tissue Head & Neck (non-thyroid)  Result Date: 10/24/2018 CLINICAL DATA:  73 year old female with a palpable painful nodule in the right supraclavicular region. EXAM: ULTRASOUND OF HEAD/NECK SOFT TISSUES TECHNIQUE: Ultrasound examination of the head and neck soft tissues was performed in the area of clinical concern. COMPARISON:  None. FINDINGS: Sonographic interrogation of the region of clinical concern demonstrates normal subcutaneous adipose tissue and strap muscles. No evidence of lymphadenopathy, solid mass, or cystic mass. IMPRESSION: No abnormality identified in the region of clinical concern. Electronically Signed   By: Jacqulynn Cadet M.D.   On: 10/24/2018 13:04   Dg Chest Portable 1 View  Result Date: 10/30/2018 CLINICAL DATA:  Shortness of breath.  Vomiting.  Possible seizure. EXAM: PORTABLE CHEST 1 VIEW COMPARISON:  04/16/2017 FINDINGS: Lower cervical spine fixation. Midline trachea. Normal heart size. Mild right hemidiaphragm elevation. No pleural effusion or pneumothorax. Interstitial thickening is moderate, chronic, and accentuated by low lung volumes and AP portable technique. No lobar consolidation. IMPRESSION: No acute cardiopulmonary disease. Chronic is tissue thickening, accentuated by low lung volumes. Likely the sequelae of smoking/chronic bronchitis. Electronically Signed   By: Abigail Miyamoto M.D.   On: 10/30/2018 05:54     CBC Recent Labs  Lab 10/30/18 0558 10/31/18 0557  WBC 27.6* 13.6*  HGB 13.1 10.9*  HCT 41.8 35.5*  PLT 353 265  MCV 93.7 95.2  MCH 29.4 29.2  MCHC 31.3 30.7  RDW 14.0 14.5  LYMPHSABS 1.3  --   MONOABS 1.3*  --   EOSABS 0.0  --   BASOSABS 0.1  --     Chemistries  Recent Labs  Lab 10/30/18 0558 10/31/18 0557  NA 138 139  K 3.8 3.8  CL 106 108  CO2 19* 22  GLUCOSE 171* 132*  BUN 22 13  CREATININE 1.45* 0.90    CALCIUM 9.0 8.1*  AST 48*  --   ALT 32  --   ALKPHOS 63  --   BILITOT 0.5  --    ------------------------------------------------------------------------------------------------------------------ No results for input(s): CHOL, HDL, LDLCALC, TRIG, CHOLHDL, LDLDIRECT in the last 72 hours.  Lab Results  Component Value Date   HGBA1C 8.1 01/01/2016   ------------------------------------------------------------------------------------------------------------------ No results for input(s): TSH, T4TOTAL, T3FREE, THYROIDAB in the last 72 hours.  Invalid input(s): FREET3 ------------------------------------------------------------------------------------------------------------------ No results for input(s): VITAMINB12, FOLATE, FERRITIN, TIBC, IRON, RETICCTPCT in the last 72 hours.  Coagulation profile Recent Labs  Lab 10/30/18 0558  INR 1.2    No results for input(s): DDIMER in the last 72 hours.  Cardiac Enzymes No results for  input(s): CKMB, TROPONINI, MYOGLOBIN in the last 168 hours.  Invalid input(s): CK ------------------------------------------------------------------------------------------------------------------ No results found for: BNP   Roxan Hockey M.D on 10/31/2018 at 9:44 AM  Go to [email or web address] - for contact info  [facility] - Office  [phone]

## 2018-11-01 ENCOUNTER — Telehealth (HOSPITAL_COMMUNITY): Payer: Self-pay | Admitting: Physical Therapy

## 2018-11-01 ENCOUNTER — Ambulatory Visit (HOSPITAL_COMMUNITY): Payer: Medicare HMO | Admitting: Physical Therapy

## 2018-11-01 LAB — C DIFFICILE QUICK SCREEN W PCR REFLEX
C Diff antigen: NEGATIVE
C Diff interpretation: NOT DETECTED
C Diff toxin: NEGATIVE

## 2018-11-01 LAB — GASTROINTESTINAL PANEL BY PCR, STOOL (REPLACES STOOL CULTURE)

## 2018-11-01 LAB — URINE CULTURE: Culture: >=100000 — AB

## 2018-11-01 LAB — GLUCOSE, CAPILLARY
Glucose-Capillary: 131 mg/dL — ABNORMAL HIGH (ref 70–99)
Glucose-Capillary: 140 mg/dL — ABNORMAL HIGH (ref 70–99)

## 2018-11-01 MED ORDER — FUROSEMIDE 40 MG PO TABS
40.0000 mg | ORAL_TABLET | Freq: Once | ORAL | Status: AC
  Administered 2018-11-01: 40 mg via ORAL
  Filled 2018-11-01: qty 1

## 2018-11-01 MED ORDER — ALBUTEROL SULFATE (2.5 MG/3ML) 0.083% IN NEBU: 2.5000 mg | INHALATION_SOLUTION | Freq: Four times a day (QID) | RESPIRATORY_TRACT | 12 refills | Status: DC | PRN

## 2018-11-01 MED ORDER — SODIUM CHLORIDE 0.9 % IV SOLN
1.0000 g | Freq: Once | INTRAVENOUS | Status: AC
  Administered 2018-11-01: 1 g via INTRAVENOUS
  Filled 2018-11-01: qty 10

## 2018-11-01 MED ORDER — CEPHALEXIN 500 MG PO CAPS: 500.0000 mg | ORAL_CAPSULE | Freq: Three times a day (TID) | ORAL | 0 refills | Status: AC

## 2018-11-01 MED ORDER — HYDROCODONE-ACETAMINOPHEN 5-325 MG PO TABS: 1.0000 | ORAL_TABLET | ORAL | 0 refills | Status: DC | PRN

## 2018-11-01 MED ORDER — SPIRIVA HANDIHALER 18 MCG IN CAPS: 18.0000 ug | ORAL_CAPSULE | Freq: Every day | RESPIRATORY_TRACT | 2 refills | Status: DC

## 2018-11-01 MED ORDER — ASPIRIN EC 81 MG PO TBEC: 81.0000 mg | DELAYED_RELEASE_TABLET | Freq: Every day | ORAL | 2 refills | Status: AC

## 2018-11-01 MED ORDER — ACETAMINOPHEN 325 MG PO TABS: 650.0000 mg | ORAL_TABLET | Freq: Four times a day (QID) | ORAL | 0 refills | Status: DC | PRN

## 2018-11-01 MED ORDER — OXYBUTYNIN CHLORIDE 5 MG PO TABS: 5.0000 mg | ORAL_TABLET | Freq: Two times a day (BID) | ORAL | 2 refills | Status: DC

## 2018-11-01 NOTE — Telephone Encounter (Signed)
Pt cx her apptments on the phone tree-s/w husband and she is in the hospital and will not be here this week

## 2018-11-01 NOTE — Progress Notes (Signed)
Nsg Discharge Note  Admit Date:  10/30/2018 Discharge date: 11/01/2018   [facility] to be D/C'd Home per MD order.  AVS completed.  Copy for chart, and copy for patient signed, and dated. Patient/caregiver able to verbalize understanding.  Discharge Medication: Allergies as of 11/01/2018      Reactions   Penicillins Other (See Comments)   Vomiting, ge 10 Can take cephal Did it involve swelling of the face/tongue/throat, SOB, or low BP? No Did it involve sudden or severe rash/hives, skin peeling, or any reaction on the inside of your mouth or nose? No Did you need to seek medical attention at a hospital or doctor's office? No When did it last happen? If all above answers are "NO", may proceed with cephalosporin use.   Prinivil [lisinopril] Cough      Medication List    STOP taking these medications   diclofenac 75 MG EC tablet Commonly known as: VOLTAREN   losartan 50 MG tablet Commonly known as: COZAAR   naproxen 500 MG tablet Commonly known as: NAPROSYN     TAKE these medications   acetaminophen 325 MG tablet Commonly known as: TYLENOL Take 2 tablets (650 mg total) by mouth every 6 (six) hours as needed for mild pain, fever or headache (or Fever >/= 101).   albuterol (2.5 MG/3ML) 0.083% nebulizer solution Commonly known as: PROVENTIL Take 3 mLs (2.5 mg total) by nebulization every 6 (six) hours as needed for wheezing or shortness of breath (cough). Dx J44.1 (COPD)   aspirin EC 81 MG tablet Take 1 tablet (81 mg total) by mouth daily with breakfast. What changed:   when to take this  Another medication with the same name was removed. Continue taking this medication, and follow the directions you see here.   calcium carbonate 600 MG Tabs tablet Commonly known as: OS-CAL Take 600 mg by mouth 2 (two) times daily before a meal. With Vitamin D   CENTRUM SILVER PO Take by mouth.   cephALEXin 500 MG capsule Commonly known as: Keflex Take 1 capsule (500 mg  total) by mouth 3 (three) times daily for 4 days. For UTI Start taking on: November 02, 2018   fish oil-omega-3 fatty acids 1000 MG capsule Take 1 g by mouth daily.   gabapentin 300 MG capsule Commonly known as: NEURONTIN Take 1 capsule (300 mg total) by mouth 3 (three) times daily.   HYDROcodone-acetaminophen 5-325 MG tablet Commonly known as: NORCO/VICODIN Take 1 tablet by mouth every 4 (four) hours as needed for moderate pain or severe pain.   IRON PO Take 65 mg by mouth daily.   levETIRAcetam 500 MG tablet Commonly known as: KEPPRA Take 1 tablet (500 mg total) by mouth 2 (two) times daily.   metFORMIN 500 MG tablet Commonly known as: GLUCOPHAGE 1 TABLET BY MOUTH TWICE DAILY WITH MEALS   oxybutynin 5 MG tablet Commonly known as: DITROPAN Take 1 tablet (5 mg total) by mouth 2 (two) times daily. What changed:   how much to take  when to take this   pioglitazone 30 MG tablet Commonly known as: ACTOS Take 30 mg by mouth daily.   rosuvastatin 10 MG tablet Commonly known as: CRESTOR Take 10 mg by mouth daily.   Spiriva HandiHaler 18 MCG inhalation capsule Generic drug: tiotropium Place 1 capsule (18 mcg total) into inhaler and inhale daily.   Tresiba 100 UNIT/ML Soln Generic drug: Insulin Degludec Inject 36 Units into the skin.  Durable Medical Equipment  (From admission, onward)         Start     Ordered   11/01/18 1100  For home use only DME Nebulizer machine  Once    Comments: Dx J44.1 (COPD)  Question Answer Comment  Patient needs a nebulizer to treat with the following condition COPD (chronic obstructive pulmonary disease) ([place])   Length of Need Lifetime      11/01/18 1100   11/01/18 1100  For home use only DME Nebulizer/meds  Once    Comments: Dx J44.1 (COPD)  Question Answer Comment  Patient needs a nebulizer to treat with the following condition COPD (chronic obstructive pulmonary disease) ([place])   Length of Need Lifetime       11/01/18 1100          Discharge Assessment: Vitals:   10/31/18 2235 11/01/18 0514  BP:  (!) 148/73  Pulse:  81  Resp:  20  Temp:  99.5 F (37.5 C)  SpO2: 95% 93%   Skin clean, dry and intact without evidence of skin break down, no evidence of skin tears noted. IV catheter discontinued intact. Site without signs and symptoms of complications - no redness or edema noted at insertion site, patient denies c/o pain - only slight tenderness at site.  Dressing with slight pressure applied.  D/c Instructions-Education: Discharge instructions given to patient/family with verbalized understanding. D/c education completed with patient/family including follow up instructions, medication list, d/c activities limitations if indicated, with other d/c instructions as indicated by MD - patient able to verbalize understanding, all questions fully answered. Patient instructed to return to ED, call 911, or call MD for any changes in condition.  Patient escorted via [place], and D/C home via private auto.  Loa Socks, RN 11/01/2018 11:42 AM

## 2018-11-01 NOTE — Discharge Summary (Signed)
[facility], is a 73 y.o. female  DOB 13-Dec-1945  MRN [id number].  Admission date:  10/30/2018  Admitting Physician  Roxan Hockey, MD  Discharge Date:  11/01/2018   Primary MD  Celene Squibb, MD  Recommendations for primary care physician for things to follow:   1)Low salt Diet advised 2) nebulizer treatments and Spiriva for COPD as prescribed 3) please complete Keflex antibiotics for E. coli urinary tract infection 4) please follow-up with the primary care physician within a week for recheck and repeat BMP/kidney and electrolyte blood test  Admission Diagnosis  Fever, unspecified fever cause [R50.9]   Discharge Diagnosis  Fever, unspecified fever cause [R50.9]    Principal Problem:   Sepsis from Urinary Source Active Problems:   Acute pyelonephritis   Hypertension   Diarrhea   DM (diabetes mellitus) ([place])      Past Medical History:  Diagnosis Date   Anemia    Arthritis    Asthma    mild   Cough    Diabetes mellitus type 2, uncomplicated ([place])    x 10 yrs   Diabetic neuropathy ([place]) rt foot   Headache(784.0)    Hypercholesteremia    Hyperlipidemia    Hypertension    Hypertriglyceridemia 2002   IBS (irritable bowel syndrome)    Neuropathy    Seizures ([place])     Past Surgical History:  Procedure Laterality Date   APPENDECTOMY     BREAST SURGERY  1999   left breast biopsy   CERVICAL SPINE SURGERY  december 2003   [place]   for unknown reason.  They thought she had a mass.   COLONOSCOPY  12/04/2010   Procedure: COLONOSCOPY;  Surgeon: Rogene Houston, MD;  [facility];  Service: Endoscopy;  Laterality: N/A;  3:00   COLONOSCOPY N/A 01/18/2015   Procedure: COLONOSCOPY;  Surgeon: Rogene Houston, MD;  Location[facility];  Service: Endoscopy;  Laterality: N/A;  955 - moved to 10:40 - Ann notified pt       HPI  from the  history and physical done on the day of admission:     Maria Esparza  is a 73 y.o. female with past medical history relevant for HTN, obesity, DM, possible seizure disorder (on Keppra), history of asthma/COPD and HLD presents to ED via EMS due to fevers and shaking chills at home -Initially patient's husband thought she was having a seizure due to shaking chills and rigors as well as lethargy -No tongue biting, no incontinence, no postictal type findings -doubt that patient actually had  seizure this time around suspect patient had shaking chills and rigors from UTI/pyelonephritis rather than frank seizures  --Additional history obtained from patient's husband at bedside  -Patient was having diarrhea for last couple weeks, stools are loose to mushy, usually without any blood or mucus, - PCP had requested stool studies--results of the outpatient stool studies are pending  -Overnight patient developed fevers, shaking chills, rigors, also developed nausea and vomiting emesis  was without bile or blood -Patient had back/flank pain, she did have some urinary discomfort but denies significant frequency -Patient became somewhat lethargic husband was worried about possible seizures and called EMS   in ED -Temperature 102.8, heart rate up to 115, respiratory rate was 23, O2 sats was 91% on room air  lactic acid is down to 2.7 from 4.3 after initial IV fluids,  -WBC was 27.6 -UA strongly suspicious for UTI, CT chest, abdomen and pelvis -with COPD findings and perinephric findings suggestive of pyelonephritis bilaterally  =-Patient initially received vancomycin, Flagyl and cefepime in the ED     Hospital Course:    1)Sepsis secondary to urinary source----patient was admitted with fevers with temp up to 102.8 , had tachycardia with heart rates up to 115 , patient also had tachypnea, lactic acid is down to 2.7 from 4.3 after initial IV fluids, WBC was 27.6 -She met sepsis criteria on  admission =-Patient initially received vancomycin, Flagyl and cefepime in the ED -Given urinary source for infection will de-escalate to IV Rocephin pending blood and urine cultures -Completed 3 days of IV Rocephin was discharged home on p.o. Keflex per urine culture sensitivity report -Sepsis pathophysiology resolved  2) E. coli acute Pyelonephritis--  -urine culture with E. coli, CT abdomen and pelvis with findings of perinephric stranding bilaterally most likely suggestive of pyelonephritis-treated with iV Rocephin, overall much improved okay to discharge home on p.o. Keflex  3)COPD/emphysema--- patient is a reformed smoker, no acute exacerbation, discharged on Spiriva daily and as needed albuterol nebulizer treatments continue bronchodilators  4)HTN--continue to hold losartan , BP overall stable at this time  5)DM2-okay to restart metformin as renal function is normalized, c/n Actos -Restart home insulin regimen  6)AKI----acute kidney injury suspect due to UTI and dehydration losartan and metformin use as well as NSAID use--   creatinine on admission= 1.45 ,   creatinine is down to 0.9, AKI has resolved, baseline creatinine = 1.0   7)Cholelithiasis without evidence for acute cholecystitis--asymptomatic, expectant management  8)Prior history of possible seizures--doubt that patient actually had  seizure this time around suspect patient had shaking chills and rigors from UTI/pyelonephritis rather than frank seizures -Continue home dose Keppra  9)Diarrhea--patient has had diarrhea for about 2 weeks now, PCP previously ordered stool studies - -C. difficile negative here, stool for GI pathogen obtained and results pending   Discharge Condition: stable  Follow UP--PCP for repeat BMP test within a week  Diet and Activity recommendation:  As advised  Discharge Instructions    Discharge Instructions    Call MD for:  difficulty breathing, headache or visual disturbances   Complete  by: As directed    Call MD for:  persistant dizziness or light-headedness   Complete by: As directed    Call MD for:  persistant nausea and vomiting   Complete by: As directed    Call MD for:  temperature >100.4   Complete by: As directed    Diet - low sodium heart healthy   Complete by: As directed    Discharge instructions   Complete by: As directed    1)Low salt Diet advised 2) nebulizer treatments and Spiriva for COPD as prescribed 3) please complete Keflex antibiotics for E. coli urinary tract infection 4) please follow-up with the primary care physician within a week for recheck and repeat BMP/kidney and electrolyte blood test 5)Avoid ibuprofen/Advil/Aleve/Motrin/Goody Powders/Naproxen/BC powders/Meloxicam/Diclofenac/Indomethacin and other Nonsteroidal anti-inflammatory medications as these will make you more likely to bleed and can cause  stomach ulcers, can also cause Kidney problems.   Increase activity slowly   Complete by: As directed         Discharge Medications     Allergies as of 11/01/2018      Reactions   Penicillins Other (See Comments)   Vomiting, ge 10 Can take cephal Did it involve swelling of the face/tongue/throat, SOB, or low BP? No Did it involve sudden or severe rash/hives, skin peeling, or any reaction on the inside of your mouth or nose? No Did you need to seek medical attention at a hospital or doctor's office? No When did it last happen? If all above answers are "NO", may proceed with cephalosporin use.   Prinivil [lisinopril] Cough      Medication List    STOP taking these medications   diclofenac 75 MG EC tablet Commonly known as: VOLTAREN   losartan 50 MG tablet Commonly known as: COZAAR   naproxen 500 MG tablet Commonly known as: NAPROSYN     TAKE these medications   acetaminophen 325 MG tablet Commonly known as: TYLENOL Take 2 tablets (650 mg total) by mouth every 6 (six) hours as needed for mild pain, fever or headache (or  Fever >/= 101).   albuterol (2.5 MG/3ML) 0.083% nebulizer solution Commonly known as: PROVENTIL Take 3 mLs (2.5 mg total) by nebulization every 6 (six) hours as needed for wheezing or shortness of breath (cough). Dx J44.1 (COPD)   aspirin EC 81 MG tablet Take 1 tablet (81 mg total) by mouth daily with breakfast. What changed:   when to take this  Another medication with the same name was removed. Continue taking this medication, and follow the directions you see here.   calcium carbonate 600 MG Tabs tablet Commonly known as: OS-CAL Take 600 mg by mouth 2 (two) times daily before a meal. With Vitamin D   CENTRUM SILVER PO Take by mouth.   cephALEXin 500 MG capsule Commonly known as: Keflex Take 1 capsule (500 mg total) by mouth 3 (three) times daily for 4 days. For UTI Start taking on: November 02, 2018   fish oil-omega-3 fatty acids 1000 MG capsule Take 1 g by mouth daily.   gabapentin 300 MG capsule Commonly known as: NEURONTIN Take 1 capsule (300 mg total) by mouth 3 (three) times daily.   HYDROcodone-acetaminophen 5-325 MG tablet Commonly known as: NORCO/VICODIN Take 1 tablet by mouth every 4 (four) hours as needed for moderate pain or severe pain.   IRON PO Take 65 mg by mouth daily.   levETIRAcetam 500 MG tablet Commonly known as: KEPPRA Take 1 tablet (500 mg total) by mouth 2 (two) times daily.   metFORMIN 500 MG tablet Commonly known as: GLUCOPHAGE 1 TABLET BY MOUTH TWICE DAILY WITH MEALS   oxybutynin 5 MG tablet Commonly known as: DITROPAN Take 1 tablet (5 mg total) by mouth 2 (two) times daily. What changed:   how much to take  when to take this   pioglitazone 30 MG tablet Commonly known as: ACTOS Take 30 mg by mouth daily.   rosuvastatin 10 MG tablet Commonly known as: CRESTOR Take 10 mg by mouth daily.   Spiriva HandiHaler 18 MCG inhalation capsule Generic drug: tiotropium Place 1 capsule (18 mcg total) into inhaler and inhale daily.     Tresiba 100 UNIT/ML Soln Generic drug: Insulin Degludec Inject 36 Units into the skin.            Durable Medical Equipment  (From admission, onward)  Start     Ordered   11/01/18 1100  For home use only DME Nebulizer machine  Once    Comments: Dx J44.1 (COPD)  Question Answer Comment  Patient needs a nebulizer to treat with the following condition COPD (chronic obstructive pulmonary disease) ([place])   Length of Need Lifetime      11/01/18 1100   11/01/18 1100  For home use only DME Nebulizer/meds  Once    Comments: Dx J44.1 (COPD)  Question Answer Comment  Patient needs a nebulizer to treat with the following condition COPD (chronic obstructive pulmonary disease) ([place])   Length of Need Lifetime      11/01/18 1100          Major procedures and Radiology Reports - PLEASE review detailed and final reports for all details, in brief -   Ct Abdomen Pelvis Wo Contrast  Result Date: 10/30/2018 CLINICAL DATA:  73 year old female with fever of unknown origin and sepsis. EXAM: CT CHEST, ABDOMEN AND PELVIS WITHOUT CONTRAST TECHNIQUE: Multidetector CT imaging of the chest, abdomen and pelvis was performed following the standard protocol without IV contrast. COMPARISON:  None FINDINGS: CT CHEST FINDINGS Cardiovascular: Limited evaluation in the absence of intravenous contrast. Conventional 3 vessel arch anatomy. Mild atherosclerotic plaque along the thoracic aorta. No evidence of aneurysm. Calcifications present throughout the coronary arteries including the left main, left anterior descending, circumflex and right coronary artery. The heart is normal in size. No pericardial effusion. Main pulmonary artery is normal in size. Mediastinum/Nodes: Unremarkable CT appearance of the thyroid gland. No suspicious mediastinal or hilar adenopathy. No soft tissue mediastinal mass. The thoracic esophagus is unremarkable. Lungs/Pleura: Significant respiratory motion artifact limits evaluation for  pulmonary nodules. Moderately severe centrilobular paraseptal emphysema. Diffuse mild bronchial wall thickening. Mild interlobular septal thickening in the upper lungs suggests mild edema. Dependent atelectasis bilaterally. Mild subpleural reticulation and probable architectural distortion in the periphery of the lungs. Musculoskeletal: No acute fracture or aggressive appearing lytic or blastic osseous lesion. CT ABDOMEN PELVIS FINDINGS Hepatobiliary: Normal hepatic contour. No discrete lesion. No biliary ductal dilatation. Calcified stones within the gallbladder lumen. No secondary evidence of acute cholecystitis. Pancreas: Fatty atrophy of the pancreas.  No inflammation or mass. Spleen: Normal in size without focal abnormality. Adrenals/Urinary Tract: Normal adrenal glands. Nonspecific perinephric stranding bilaterally. The kidneys are somewhat indistinct in appearance. No hydronephrosis or nephrolithiasis. There are several circumscribed water attenuation structures which are incompletely evaluated in the absence of IV contrast. Statistically, these are likely simple cysts. The largest, exophytic from the left lower pole measures up to 9 cm in diameter. Unremarkable appearance of the ureters. The bladder is decompressed. Small locule of gas in the bladder dome likely related to recent instrumentation. Stomach/Bowel: No focal bowel wall thickening or evidence of obstruction. The appendix is surgically absent. Vascular/Lymphatic: Limited evaluation in the absence of IV contrast. Fairly extensive atherosclerotic vascular calcifications along the abdominal aorta. No lymphadenopathy. Reproductive: Uterus and bilateral adnexa are unremarkable. Other: No ascites or hernia. Musculoskeletal: No acute fracture or aggressive appearing lytic or blastic osseous lesion. Multilevel degenerative disc disease and lower lumbar facet arthropathy. Mild grade 1 anterolisthesis of L4 on L5. IMPRESSION: 1. Although incompletely  evaluated in the absence of IV contrast, the kidneys appear slightly indistinct and demonstrate nonspecific perinephric stranding bilaterally. Recommend clinical correlation with urinalysis to assess for urinary tract infection. 2. Due to fairly significant respiratory motion artifact and a background of chronic changes, it is difficult to exclude a subtle infectious/inflammatory process  in the lungs. No obvious pneumonia identified. 3. Bilateral low-attenuation renal masses are incompletely evaluated without IV contrast. These are water attenuation and statistically likely to represent benign cysts. 4. Cholelithiasis without secondary imaging evidence to suggest cholecystitis. 5. Pulmonary emphysema and likely chronic bronchial wall thickening consistent with COPD superimposed on a background of peripheral fibrotic changes. 6. Aortic atherosclerosis and multivessel coronary artery disease. 7. Multilevel degenerative disc disease and mid and lower lumbar facet arthropathy. Aortic Atherosclerosis (ICD10-I70.0) and Emphysema (ICD10-J43.9). Electronically Signed   By: Jacqulynn Cadet M.D.   On: 10/30/2018 09:14   Ct Chest Wo Contrast  Result Date: 10/30/2018 CLINICAL DATA:  73 year old female with fever of unknown origin and sepsis. EXAM: CT CHEST, ABDOMEN AND PELVIS WITHOUT CONTRAST TECHNIQUE: Multidetector CT imaging of the chest, abdomen and pelvis was performed following the standard protocol without IV contrast. COMPARISON:  None FINDINGS: CT CHEST FINDINGS Cardiovascular: Limited evaluation in the absence of intravenous contrast. Conventional 3 vessel arch anatomy. Mild atherosclerotic plaque along the thoracic aorta. No evidence of aneurysm. Calcifications present throughout the coronary arteries including the left main, left anterior descending, circumflex and right coronary artery. The heart is normal in size. No pericardial effusion. Main pulmonary artery is normal in size. Mediastinum/Nodes:  Unremarkable CT appearance of the thyroid gland. No suspicious mediastinal or hilar adenopathy. No soft tissue mediastinal mass. The thoracic esophagus is unremarkable. Lungs/Pleura: Significant respiratory motion artifact limits evaluation for pulmonary nodules. Moderately severe centrilobular paraseptal emphysema. Diffuse mild bronchial wall thickening. Mild interlobular septal thickening in the upper lungs suggests mild edema. Dependent atelectasis bilaterally. Mild subpleural reticulation and probable architectural distortion in the periphery of the lungs. Musculoskeletal: No acute fracture or aggressive appearing lytic or blastic osseous lesion. CT ABDOMEN PELVIS FINDINGS Hepatobiliary: Normal hepatic contour. No discrete lesion. No biliary ductal dilatation. Calcified stones within the gallbladder lumen. No secondary evidence of acute cholecystitis. Pancreas: Fatty atrophy of the pancreas.  No inflammation or mass. Spleen: Normal in size without focal abnormality. Adrenals/Urinary Tract: Normal adrenal glands. Nonspecific perinephric stranding bilaterally. The kidneys are somewhat indistinct in appearance. No hydronephrosis or nephrolithiasis. There are several circumscribed water attenuation structures which are incompletely evaluated in the absence of IV contrast. Statistically, these are likely simple cysts. The largest, exophytic from the left lower pole measures up to 9 cm in diameter. Unremarkable appearance of the ureters. The bladder is decompressed. Small locule of gas in the bladder dome likely related to recent instrumentation. Stomach/Bowel: No focal bowel wall thickening or evidence of obstruction. The appendix is surgically absent. Vascular/Lymphatic: Limited evaluation in the absence of IV contrast. Fairly extensive atherosclerotic vascular calcifications along the abdominal aorta. No lymphadenopathy. Reproductive: Uterus and bilateral adnexa are unremarkable. Other: No ascites or hernia.  Musculoskeletal: No acute fracture or aggressive appearing lytic or blastic osseous lesion. Multilevel degenerative disc disease and lower lumbar facet arthropathy. Mild grade 1 anterolisthesis of L4 on L5. IMPRESSION: 1. Although incompletely evaluated in the absence of IV contrast, the kidneys appear slightly indistinct and demonstrate nonspecific perinephric stranding bilaterally. Recommend clinical correlation with urinalysis to assess for urinary tract infection. 2. Due to fairly significant respiratory motion artifact and a background of chronic changes, it is difficult to exclude a subtle infectious/inflammatory process in the lungs. No obvious pneumonia identified. 3. Bilateral low-attenuation renal masses are incompletely evaluated without IV contrast. These are water attenuation and statistically likely to represent benign cysts. 4. Cholelithiasis without secondary imaging evidence to suggest cholecystitis. 5. Pulmonary emphysema and likely chronic bronchial  wall thickening consistent with COPD superimposed on a background of peripheral fibrotic changes. 6. Aortic atherosclerosis and multivessel coronary artery disease. 7. Multilevel degenerative disc disease and mid and lower lumbar facet arthropathy. Aortic Atherosclerosis (ICD10-I70.0) and Emphysema (ICD10-J43.9). Electronically Signed   By: Jacqulynn Cadet M.D.   On: 10/30/2018 09:14   US Soft Tissue Head & Neck (non-thyroid)  Result Date: 10/24/2018 CLINICAL DATA:  73 year old female with a palpable painful nodule in the right supraclavicular region. EXAM: ULTRASOUND OF HEAD/NECK SOFT TISSUES TECHNIQUE: Ultrasound examination of the head and neck soft tissues was performed in the area of clinical concern. COMPARISON:  None. FINDINGS: Sonographic interrogation of the region of clinical concern demonstrates normal subcutaneous adipose tissue and strap muscles. No evidence of lymphadenopathy, solid mass, or cystic mass. IMPRESSION: No abnormality  identified in the region of clinical concern. Electronically Signed   By: Jacqulynn Cadet M.D.   On: 10/24/2018 13:04   Dg Chest Portable 1 View  Result Date: 10/30/2018 CLINICAL DATA:  Shortness of breath.  Vomiting.  Possible seizure. EXAM: PORTABLE CHEST 1 VIEW COMPARISON:  04/16/2017 FINDINGS: Lower cervical spine fixation. Midline trachea. Normal heart size. Mild right hemidiaphragm elevation. No pleural effusion or pneumothorax. Interstitial thickening is moderate, chronic, and accentuated by low lung volumes and AP portable technique. No lobar consolidation. IMPRESSION: No acute cardiopulmonary disease. Chronic is tissue thickening, accentuated by low lung volumes. Likely the sequelae of smoking/chronic bronchitis. Electronically Signed   By: Abigail Miyamoto M.D.   On: 10/30/2018 05:54    Micro Results    Recent Results (from the past 240 hour(s))  SARS Coronavirus 2 [facility] order, Performed in [facility] hospital lab) Nasopharyngeal Nasopharyngeal Swab     Status: None   Collection Time: 10/30/18  5:27 AM   Specimen: Nasopharyngeal Swab  Result Value Ref Range Status   SARS Coronavirus 2 NEGATIVE NEGATIVE Final    Comment: (NOTE) If result is NEGATIVE SARS-CoV-2 target nucleic acids are NOT DETECTED. The SARS-CoV-2 RNA is generally detectable in upper and lower  respiratory specimens during the acute phase of infection. The lowest  concentration of SARS-CoV-2 viral copies this assay can detect is 250  copies / mL. A negative result does not preclude SARS-CoV-2 infection  and should not be used as the sole basis for treatment or other  patient management decisions.  A negative result may occur with  improper specimen collection / handling, submission of specimen other  than nasopharyngeal swab, presence of viral mutation(s) within the  areas targeted by this assay, and inadequate number of viral copies  (<250 copies / mL). A negative result must be combined with clinical    observations, patient history, and epidemiological information. If result is POSITIVE SARS-CoV-2 target nucleic acids are DETECTED. The SARS-CoV-2 RNA is generally detectable in upper and lower  respiratory specimens dur ing the acute phase of infection.  Positive  results are indicative of active infection with SARS-CoV-2.  Clinical  correlation with patient history and other diagnostic information is  necessary to determine patient infection status.  Positive results do  not rule out bacterial infection or co-infection with other viruses. If result is PRESUMPTIVE POSTIVE SARS-CoV-2 nucleic acids MAY BE PRESENT.   A presumptive positive result was obtained on the submitted specimen  and confirmed on repeat testing.  While 2019 novel coronavirus  (SARS-CoV-2) nucleic acids may be present in the submitted sample  additional confirmatory testing may be necessary for epidemiological  and / or clinical management purposes  to differentiate between  SARS-CoV-2 and other Sarbecovirus currently known to infect humans.  If clinically indicated additional testing with an alternate test  methodology [id number]) is advised. The SARS-CoV-2 RNA is generally  detectable in upper and lower respiratory sp ecimens during the acute  phase of infection. The expected result is Negative. Fact Sheet for Patients:  StrictlyIdeas.no Fact Sheet for Healthcare Providers: BankingDealers.co.za This test is not yet approved or cleared by the [place] FDA and has been authorized for detection and/or diagnosis of SARS-CoV-2 by FDA under an Emergency Use Authorization (EUA).  This EUA will remain in effect (meaning this test can be used) for the duration of the COVID-19 declaration under Section 564(b)(1) of the Act, 21 U.S.C. section 360bbb-3(b)(1), unless the authorization is terminated or revoked sooner. Performed at [facility], [address],  [place], [place] [address]   Blood Culture (routine x 2)     Status: None (Preliminary result)   Collection Time: 10/30/18  5:58 AM   Specimen: BLOOD LEFT HAND  Result Value Ref Range Status   Specimen Description   Final    BLOOD LEFT HAND BOTTLES DRAWN AEROBIC AND ANAEROBIC   Special Requests Blood Culture adequate volume  Final   Culture   Final    NO GROWTH 2 DAYS Performed at [facility], [address] [place], [place] [address]    Report Status PENDING  Incomplete  Blood Culture (routine x 2)     Status: None (Preliminary result)   Collection Time: 10/30/18  6:00 AM   Specimen: Right Antecubital; Blood  Result Value Ref Range Status   Specimen Description   Final    RIGHT ANTECUBITAL BOTTLES DRAWN AEROBIC AND ANAEROBIC   Special Requests Blood Culture adequate volume  Final   Culture   Final    NO GROWTH 2 DAYS Performed at [facility], [address] [place], [place] [address]    Report Status PENDING  Incomplete  Urine culture     Status: Abnormal   Collection Time: 10/30/18  8:04 AM   Specimen: Urine, Catheterized  Result Value Ref Range Status   Specimen Description   Final    URINE, CATHETERIZED Performed at [facility], [address] [place], [place] [address]    Special Requests   Final    NONE Performed at [facility], [address], [place], [place] [address]    Culture >=100,000 COLONIES/mL ESCHERICHIA COLI (A)  Final   Report Status 11/01/2018 FINAL  Final   Organism ID, Bacteria ESCHERICHIA COLI (A)  Final      Susceptibility   Escherichia coli - MIC*    AMPICILLIN <=2 SENSITIVE Sensitive     CEFAZOLIN <=4 SENSITIVE Sensitive     CEFTRIAXONE <=1 SENSITIVE Sensitive     CIPROFLOXACIN <=0.25 SENSITIVE Sensitive     GENTAMICIN <=1 SENSITIVE Sensitive     IMIPENEM <=0.25 SENSITIVE Sensitive     NITROFURANTOIN <=16 SENSITIVE Sensitive     TRIMETH/SULFA <=20 SENSITIVE Sensitive     AMPICILLIN/SULBACTAM <=2 SENSITIVE Sensitive     PIP/TAZO <=4 SENSITIVE  Sensitive     Extended ESBL NEGATIVE Sensitive     * >=100,000 COLONIES/mL ESCHERICHIA COLI  C difficile quick scan w PCR reflex     Status: None   Collection Time: 11/01/18 12:54 AM   Specimen: STOOL  Result Value Ref Range Status   C Diff antigen NEGATIVE NEGATIVE Final   C Diff toxin NEGATIVE NEGATIVE Final   C Diff interpretation No  C. difficile detected.  Final    Comment: Performed at [facility], [address], [place], [place] [address]       Today   [place] today has no new complaints  -Ambulated in hallways without desaturation O2 sats above 92% post ambulation           Patient has been seen and examined prior to discharge   Objective   Blood pressure (!) 148/73, pulse 81, temperature 99.5 F (37.5 C), temperature source Oral, resp. rate 20, height '5\' 8"'  (1.727 m), weight 114.4 kg, SpO2 93 %.   Intake/Output Summary (Last 24 hours) at 11/01/2018 1111 Last data filed at 11/01/2018 0500 Gross per 24 hour  Intake --  Output 800 ml  Net -800 ml    Exam Gen:- Awake Alert, no acute distress  HEENT:- [place].AT, No sclera icterus Neck-Supple Neck,No JVD,.  Lungs-  CTAB , good air movement bilaterally  CV- S1, S2 normal, regular Abd-  +ve B.Sounds, Abd Soft, No tenderness, no CVA area tenderness Extremity/Skin:- No  edema,   good pulses Psych-affect is appropriate, oriented x3 Neuro-no new focal deficits, no tremors    Data Review   CBC w Diff:  Lab Results  Component Value Date   WBC 13.6 (H) 10/31/2018   HGB 10.9 (L) 10/31/2018   HCT 35.5 (L) 10/31/2018   PLT 265 10/31/2018   LYMPHOPCT 5 10/30/2018   MONOPCT 5 10/30/2018   EOSPCT 0 10/30/2018   BASOPCT 0 10/30/2018    CMP:  Lab Results  Component Value Date   NA 139 10/31/2018   NA 137 01/06/2016   K 3.8 10/31/2018   CL 108 10/31/2018   CO2 22 10/31/2018   BUN 13 10/31/2018   BUN 12 01/06/2016   CREATININE 0.90 10/31/2018   CREATININE 0.80 01/17/2014   PROT 7.0  10/30/2018   PROT 6.5 01/06/2016   ALBUMIN 3.5 10/30/2018   ALBUMIN 4.1 01/06/2016   BILITOT 0.5 10/30/2018   BILITOT 0.3 01/06/2016   ALKPHOS 63 10/30/2018   AST 48 (H) 10/30/2018   ALT 32 10/30/2018  .   Total Discharge time is about 33 minutes  Roxan Hockey M.D on 11/01/2018 at 11:11 AM  Go to [email or web address] -  for contact info  [facility] - Office  [phone]

## 2018-11-01 NOTE — Discharge Instructions (Signed)
1)Low salt Diet advised 2) nebulizer treatments and Spiriva for COPD as prescribed 3) please complete Keflex antibiotics for E. coli urinary tract infection 4) please follow-up with the primary care physician within a week for recheck and repeat BMP/kidney and electrolyte blood test

## 2018-11-02 DIAGNOSIS — Z8673 Personal history of transient ischemic attack (TIA), and cerebral infarction without residual deficits: Secondary | ICD-10-CM | POA: Diagnosis not present

## 2018-11-02 DIAGNOSIS — I10 Essential (primary) hypertension: Secondary | ICD-10-CM | POA: Diagnosis not present

## 2018-11-02 DIAGNOSIS — E1169 Type 2 diabetes mellitus with other specified complication: Secondary | ICD-10-CM | POA: Diagnosis not present

## 2018-11-02 DIAGNOSIS — E782 Mixed hyperlipidemia: Secondary | ICD-10-CM | POA: Diagnosis not present

## 2018-11-03 ENCOUNTER — Ambulatory Visit (HOSPITAL_COMMUNITY): Payer: Medicare HMO

## 2018-11-04 DIAGNOSIS — D509 Iron deficiency anemia, unspecified: Secondary | ICD-10-CM | POA: Diagnosis not present

## 2018-11-04 DIAGNOSIS — R05 Cough: Secondary | ICD-10-CM | POA: Diagnosis not present

## 2018-11-04 DIAGNOSIS — R0782 Intercostal pain: Secondary | ICD-10-CM | POA: Diagnosis not present

## 2018-11-04 DIAGNOSIS — Z8673 Personal history of transient ischemic attack (TIA), and cerebral infarction without residual deficits: Secondary | ICD-10-CM | POA: Diagnosis not present

## 2018-11-04 DIAGNOSIS — Z0000 Encounter for general adult medical examination without abnormal findings: Secondary | ICD-10-CM | POA: Diagnosis not present

## 2018-11-04 DIAGNOSIS — E1169 Type 2 diabetes mellitus with other specified complication: Secondary | ICD-10-CM | POA: Diagnosis not present

## 2018-11-04 DIAGNOSIS — G4089 Other seizures: Secondary | ICD-10-CM | POA: Diagnosis not present

## 2018-11-04 DIAGNOSIS — Z6835 Body mass index (BMI) 35.0-35.9, adult: Secondary | ICD-10-CM | POA: Diagnosis not present

## 2018-11-04 DIAGNOSIS — J069 Acute upper respiratory infection, unspecified: Secondary | ICD-10-CM | POA: Diagnosis not present

## 2018-11-04 DIAGNOSIS — G629 Polyneuropathy, unspecified: Secondary | ICD-10-CM | POA: Diagnosis not present

## 2018-11-04 LAB — CULTURE, BLOOD (ROUTINE X 2)
Culture: NO GROWTH
Culture: NO GROWTH
Special Requests: ADEQUATE
Special Requests: ADEQUATE

## 2018-11-05 DIAGNOSIS — R69 Illness, unspecified: Secondary | ICD-10-CM | POA: Diagnosis not present

## 2018-11-08 ENCOUNTER — Telehealth (HOSPITAL_COMMUNITY): Payer: Self-pay | Admitting: Internal Medicine

## 2018-11-08 ENCOUNTER — Encounter (HOSPITAL_COMMUNITY): Payer: Self-pay

## 2018-11-08 ENCOUNTER — Ambulatory Visit (HOSPITAL_COMMUNITY): Payer: Medicare HMO

## 2018-11-08 DIAGNOSIS — R197 Diarrhea, unspecified: Secondary | ICD-10-CM | POA: Diagnosis not present

## 2018-11-08 DIAGNOSIS — J441 Chronic obstructive pulmonary disease with (acute) exacerbation: Secondary | ICD-10-CM | POA: Diagnosis not present

## 2018-11-08 DIAGNOSIS — I10 Essential (primary) hypertension: Secondary | ICD-10-CM | POA: Diagnosis not present

## 2018-11-08 DIAGNOSIS — Z8673 Personal history of transient ischemic attack (TIA), and cerebral infarction without residual deficits: Secondary | ICD-10-CM | POA: Diagnosis not present

## 2018-11-08 DIAGNOSIS — A419 Sepsis, unspecified organism: Secondary | ICD-10-CM | POA: Diagnosis not present

## 2018-11-08 DIAGNOSIS — N10 Acute tubulo-interstitial nephritis: Secondary | ICD-10-CM | POA: Diagnosis not present

## 2018-11-08 DIAGNOSIS — G4089 Other seizures: Secondary | ICD-10-CM | POA: Diagnosis not present

## 2018-11-08 DIAGNOSIS — E782 Mixed hyperlipidemia: Secondary | ICD-10-CM | POA: Diagnosis not present

## 2018-11-08 DIAGNOSIS — E1169 Type 2 diabetes mellitus with other specified complication: Secondary | ICD-10-CM | POA: Diagnosis not present

## 2018-11-08 NOTE — Therapy (Signed)
[place], [place], [address] Phone: [phone]   Fax:  [phone]  Patient Details  Name: Maria Esparza MRN: [id number] Date of Birth: Oct 13, 1945 Referring Provider:  No ref. provider found  Encounter Date: 11/08/2018   PHYSICAL THERAPY DISCHARGE SUMMARY  Visits from Start of Care: 2  Current functional level related to goals / functional outcomes: Unknown, as pt did not return to therapy and current medical status unknown due to recent hospitalization. See last treatment note (10/20/18)    Remaining deficits: Unknown, as pt did not return to therapy and current medical status unknown due to recent hospitalization. See last treatment note (10/20/18)   Education / Equipment: See last treatment note (10/20/18). Plan: Patient agrees to discharge.  Patient goals were not met. Patient is being discharged due to the patient's request.  ?????     Per front office staff Lucien Mons, "pt called to cx said that she recently got out of the hospital and has seen her dr and he doesn't think she needs to be doing therapy now.... she said to go ahead and discharge her."   Talbot Grumbling PT, DPT 11/08/18, 3:09 PM [place], [place], [address] Phone: [phone]   Fax:  [phone]

## 2018-11-08 NOTE — Telephone Encounter (Signed)
11/08/18  pt left a message to cx appt because she just recently got out of the hospital and has a drs appt.

## 2018-11-08 NOTE — Telephone Encounter (Signed)
pt called to cx said that she recently got out of the hospital and has seen her dr and he doesn't think she needs to be doing therapy now.... she said to go ahead and discharge her  11/08/18

## 2018-11-10 ENCOUNTER — Ambulatory Visit (HOSPITAL_COMMUNITY): Payer: Medicare HMO

## 2018-11-10 DIAGNOSIS — N10 Acute tubulo-interstitial nephritis: Secondary | ICD-10-CM | POA: Diagnosis not present

## 2018-11-10 DIAGNOSIS — I10 Essential (primary) hypertension: Secondary | ICD-10-CM | POA: Diagnosis not present

## 2018-11-10 DIAGNOSIS — R197 Diarrhea, unspecified: Secondary | ICD-10-CM | POA: Diagnosis not present

## 2018-11-10 DIAGNOSIS — J441 Chronic obstructive pulmonary disease with (acute) exacerbation: Secondary | ICD-10-CM | POA: Diagnosis not present

## 2018-11-10 DIAGNOSIS — Z8673 Personal history of transient ischemic attack (TIA), and cerebral infarction without residual deficits: Secondary | ICD-10-CM | POA: Diagnosis not present

## 2018-11-10 DIAGNOSIS — E1169 Type 2 diabetes mellitus with other specified complication: Secondary | ICD-10-CM | POA: Diagnosis not present

## 2018-11-10 DIAGNOSIS — A419 Sepsis, unspecified organism: Secondary | ICD-10-CM | POA: Diagnosis not present

## 2018-11-10 DIAGNOSIS — G4089 Other seizures: Secondary | ICD-10-CM | POA: Diagnosis not present

## 2018-11-10 DIAGNOSIS — E782 Mixed hyperlipidemia: Secondary | ICD-10-CM | POA: Diagnosis not present

## 2018-11-14 ENCOUNTER — Other Ambulatory Visit: Payer: Self-pay

## 2018-11-14 ENCOUNTER — Ambulatory Visit: Payer: Medicare HMO | Admitting: Diagnostic Neuroimaging

## 2018-11-14 ENCOUNTER — Telehealth: Payer: Self-pay

## 2018-11-14 DIAGNOSIS — E1140 Type 2 diabetes mellitus with diabetic neuropathy, unspecified: Secondary | ICD-10-CM | POA: Diagnosis not present

## 2018-11-14 DIAGNOSIS — M79672 Pain in left foot: Secondary | ICD-10-CM | POA: Diagnosis not present

## 2018-11-14 DIAGNOSIS — L110 Acquired keratosis follicularis: Secondary | ICD-10-CM | POA: Diagnosis not present

## 2018-11-14 DIAGNOSIS — M79671 Pain in right foot: Secondary | ICD-10-CM | POA: Diagnosis not present

## 2018-11-14 MED ORDER — LEVETIRACETAM 500 MG PO TABS: 500.0000 mg | ORAL_TABLET | Freq: Two times a day (BID) | ORAL | 9 refills | Status: DC

## 2018-11-14 NOTE — Telephone Encounter (Signed)
Tyler from [facility] stating pt is transferring her keppra to their pharmacy for the remaninig refills. [facility]. Dorothea Ogle will call back if the electronically refill did not send the rx.

## 2018-11-15 ENCOUNTER — Encounter (HOSPITAL_COMMUNITY): Payer: Medicare HMO | Admitting: Physical Therapy

## 2018-11-18 ENCOUNTER — Encounter (HOSPITAL_COMMUNITY): Payer: Medicare HMO

## 2018-11-24 ENCOUNTER — Telehealth: Payer: Self-pay | Admitting: Diagnostic Neuroimaging

## 2018-11-24 ENCOUNTER — Other Ambulatory Visit (HOSPITAL_COMMUNITY)
Admission: RE | Admit: 2018-11-24 | Discharge: 2018-11-24 | Disposition: A | Discharge status: Home / Self Care | Payer: Medicare HMO | Source: Ambulatory Visit | Attending: Internal Medicine | Admitting: Internal Medicine

## 2018-11-24 DIAGNOSIS — Z20828 Contact with and (suspected) exposure to other viral communicable diseases: Secondary | ICD-10-CM | POA: Insufficient documentation

## 2018-11-24 DIAGNOSIS — Z01812 Encounter for preprocedural laboratory examination: Secondary | ICD-10-CM | POA: Insufficient documentation

## 2018-11-24 LAB — SARS CORONAVIRUS 2 (TAT 6-24 HRS): SARS Coronavirus 2: NEGATIVE

## 2018-11-24 NOTE — Telephone Encounter (Signed)
Per Dr Leta Baptist called patient and advised her Dr Leta Baptist wants her to come in to see him or NP next week. We scheduled her for Wed because she had screening C19 test today for PFT on Tues. I advised her if she tests positive she must call us to reschedule. She had conflicts for times open for NP. She knows to arrive early, wear masks; her husband will accompany. I advised if she as seizures before Wed she'll have to go to the ED. Patient verbalized understanding, appreciation.

## 2018-11-24 NOTE — Telephone Encounter (Signed)
Pt called stating that she is still have seizures and is wanting to speak to RN to discuss. Please advise.

## 2018-11-24 NOTE — Telephone Encounter (Signed)
Spoke with patient who stated she has had two seizures since being in hospital at end of Aug. She thought she had seizure at that time, but hospital notes indicate it most likely was from her UTI, sepsis. Yesterday she woke, had pain over right eye, and began "jerking around, flapping her arms"..  Her husband witnessed this She stated that she remembers it happening. Her husband stated she  had another seizure before this one, but doesn't remember when.  She stated she has pain over her right eye each time she has a seizure. She denies missed meds, or feeling ill today.  She stated it is frightening to them.  I advised will discuss with Dr Leta Baptist and call her back today. She verbalized understanding, appreciation.

## 2018-11-29 ENCOUNTER — Other Ambulatory Visit: Payer: Self-pay

## 2018-11-29 ENCOUNTER — Other Ambulatory Visit: Payer: Self-pay | Admitting: *Deleted

## 2018-11-29 ENCOUNTER — Ambulatory Visit (INDEPENDENT_AMBULATORY_CARE_PROVIDER_SITE_OTHER): Payer: Medicare HMO | Admitting: Internal Medicine

## 2018-11-29 DIAGNOSIS — R0600 Dyspnea, unspecified: Secondary | ICD-10-CM | POA: Diagnosis not present

## 2018-11-29 LAB — PULMONARY FUNCTION TEST
DL/VA % pred: 106 %
DL/VA: 4.28 ml/min/mmHg/L
DLCO cor % pred: 92 %
DLCO cor: 19.67 ml/min/mmHg
DLCO unc % pred: 84 %
DLCO unc: 17.98 ml/min/mmHg
FEF 25-75 Post: 2.41 L/sec
FEF 25-75 Pre: 2.56 L/sec
FEF2575-%Change-Post: -5 %
FEF2575-%Pred-Post: 127 %
FEF2575-%Pred-Pre: 135 %
FEV1-%Change-Post: -4 %
FEV1-%Pred-Post: 102 %
FEV1-%Pred-Pre: 106 %
FEV1-Post: 2.49 L
FEV1-Pre: 2.6 L
FEV1FVC-%Change-Post: 0 %
FEV1FVC-%Pred-Pre: 110 %
FEV6-%Change-Post: -3 %
FEV6-%Pred-Post: 98 %
FEV6-%Pred-Pre: 101 %
FEV6-Post: 3.02 L
FEV6-Pre: 3.14 L
FEV6FVC-%Pred-Post: 104 %
FEV6FVC-%Pred-Pre: 104 %
FVC-%Change-Post: -3 %
FVC-%Pred-Post: 93 %
FVC-%Pred-Pre: 97 %
FVC-Post: 3.02 L
FVC-Pre: 3.14 L
Post FEV1/FVC ratio: 82 %
Post FEV6/FVC ratio: 100 %
Pre FEV1/FVC ratio: 83 %
Pre FEV6/FVC Ratio: 100 %
RV % pred: 86 %
RV: 2.09 L
TLC % pred: 93 %
TLC: 5.14 L

## 2018-11-29 NOTE — Progress Notes (Signed)
Full PFT performed today. °

## 2018-11-30 ENCOUNTER — Other Ambulatory Visit: Payer: Self-pay

## 2018-11-30 ENCOUNTER — Ambulatory Visit: Payer: Medicare HMO | Admitting: Diagnostic Neuroimaging

## 2018-11-30 ENCOUNTER — Encounter: Payer: Self-pay | Admitting: Diagnostic Neuroimaging

## 2018-11-30 VITALS — BP 130/72 | HR 88 | Temp 96.9°F | Ht 68.5 in | Wt 244.6 lb

## 2018-11-30 DIAGNOSIS — G8929 Other chronic pain: Secondary | ICD-10-CM | POA: Diagnosis not present

## 2018-11-30 DIAGNOSIS — G40909 Epilepsy, unspecified, not intractable, without status epilepticus: Secondary | ICD-10-CM

## 2018-11-30 DIAGNOSIS — M545 Low back pain: Secondary | ICD-10-CM

## 2018-11-30 MED ORDER — LEVETIRACETAM 1000 MG PO TABS: 1000.0000 mg | ORAL_TABLET | Freq: Two times a day (BID) | ORAL | 12 refills | Status: DC

## 2018-11-30 NOTE — Progress Notes (Signed)
[facility]  PATIENT: Maria Esparza DOB: 1946-02-10  REFERRING CLINICIAN: [place] Crane, J HISTORY FROM: patient REASON FOR VISIT: follow up    HISTORICAL  CHIEF COMPLAINT:  Chief Complaint  Patient presents with  . Seizures    rm 7, FU due to seizures, husband- [name], "have had several seizures since d/c from hospital; always feel pain above my R eye before sz"    HISTORY OF PRESENT ILLNESS:   UPDATE (11/30/18, VRP): Since last visit, was doing well until 10/30/18 --> felt cold at home, then had shaking and confusion at home for few minutes, then 2-3 hours of confusion, went to ER, and dx'd with sepsis / UTI and treated. Since then had 2 more sz events on 11/23/18 and 11/25/18 (few min of shaking arms, eyes open, no post-ictal confusion; no tongue biting; no incontinence).   UPDATE (10/04/18, VRP): Since last visit, doing here for evaluation of numbness and tingling in the feet.  Patient has had this problem for past 3 years.  Patient has diabetes which is under control with medication.  She also has some low back pain without radiating symptoms.  Here for evaluation of neuropathy versus lumbar radiculopathy.  PRIOR HPI (07/06/18): 73 year old female with hypertension, diabetes, hypercholesterolemia, here for evaluation of seizure.  December 2019 patient had episode of possibly back, color change in the face, shaking and convulsions.  No tongue biting or incontinence.  Episode lasted for 30 to 60 seconds.  Patient had evaluated by neurologist who ordered EEG and MRI.  Currently EEG was unremarkable.  MRI of the brain showed moderate to severe chronic small vessel ischemic disease and moderate atrophy.  Possibility of seizure versus TIA was raised.  Patient did well until April 2020 when she had her second event, similar seizure-like activity.  This time patient was somewhat aware during the event, felt herself going into the shaking.  He was somewhat confused.  Patient had a  3rd event May 2020, also a similar type.  No prior history of seizures.  Patient did have history of head trauma in 2005 requiring multiple sutures in the scalp.   REVIEW OF SYSTEMS: Full 14 system review of systems performed and negative with exception of: as per HPI.   ALLERGIES: Allergies  Allergen Reactions  . Penicillins Other (See Comments)    Vomiting, ge 10 Can take cephal Did it involve swelling of the face/tongue/throat, SOB, or low BP? No Did it involve sudden or severe rash/hives, skin peeling, or any reaction on the inside of your mouth or nose? No Did you need to seek medical attention at a hospital or doctor's office? No When did it last happen? If all above answers are "NO", may proceed with cephalosporin use.   . Prinivil [Lisinopril] Cough    HOME MEDICATIONS: Outpatient Medications Prior to Visit  Medication Sig Dispense Refill  . acetaminophen (TYLENOL) 325 MG tablet Take 2 tablets (650 mg total) by mouth every 6 (six) hours as needed for mild pain, fever or headache (or Fever >/= 101). 12 tablet 0  . aspirin EC 81 MG tablet Take 1 tablet (81 mg total) by mouth daily with breakfast. 30 tablet 2  . calcium carbonate (OS-CAL) 600 MG TABS Take 600 mg by mouth 2 (two) times daily before a meal. With Vitamin D     . Ferrous Sulfate (IRON PO) Take 65 mg by mouth daily.    . fish oil-omega-3 fatty acids 1000 MG capsule Take 1 g by mouth daily.      Marland Kitchen  HYDROcodone-acetaminophen (NORCO/VICODIN) 5-325 MG tablet Take 1 tablet by mouth every 4 (four) hours as needed for moderate pain or severe pain. 8 tablet 0  . Insulin Degludec (TRESIBA) 100 UNIT/ML SOLN Inject 36 Units into the skin.    Marland Kitchen levETIRAcetam (KEPPRA) 500 MG tablet Take 1 tablet (500 mg total) by mouth 2 (two) times daily. 60 tablet 9  . Multiple Vitamins-Minerals (CENTRUM SILVER PO) Take by mouth.    . oxybutynin (DITROPAN) 5 MG tablet Take 1 tablet (5 mg total) by mouth 2 (two) times daily. 60 tablet 2   . pioglitazone (ACTOS) 30 MG tablet Take 30 mg by mouth daily.    . rosuvastatin (CRESTOR) 10 MG tablet Take 10 mg by mouth daily.    Marland Kitchen tiotropium (SPIRIVA HANDIHALER) 18 MCG inhalation capsule Place 1 capsule (18 mcg total) into inhaler and inhale daily. 30 capsule 2  . albuterol (PROVENTIL) (2.5 MG/3ML) 0.083% nebulizer solution Take 3 mLs (2.5 mg total) by nebulization every 6 (six) hours as needed for wheezing or shortness of breath (cough). Dx J44.1 (COPD) (Patient not taking: Reported on 11/30/2018) 75 mL 12  . gabapentin (NEURONTIN) 300 MG capsule Take 1 capsule (300 mg total) by mouth 3 (three) times daily. (Patient not taking: Reported on 11/30/2018) 90 capsule 6  . metFORMIN (GLUCOPHAGE) 500 MG tablet 1 TABLET BY MOUTH TWICE DAILY WITH MEALS (Patient not taking: Reported on 11/30/2018) 60 tablet 5   No facility-administered medications prior to visit.     PAST MEDICAL HISTORY: Past Medical History:  Diagnosis Date  . Anemia   . Arthritis   . Asthma    mild  . Cough   . Diabetes mellitus type 2, uncomplicated (HCC)    x 10 yrs  . Diabetic neuropathy ([place]) rt foot  . Headache(784.0)   . Hypercholesteremia   . Hyperlipidemia   . Hypertension   . Hypertriglyceridemia 2002  . IBS (irritable bowel syndrome)   . Neuropathy   . Seizures ([place])    last sz 11/25/18    PAST SURGICAL HISTORY: Past Surgical History:  Procedure Laterality Date  . APPENDECTOMY    . BREAST SURGERY  1999   left breast biopsy  . CERVICAL SPINE SURGERY  december 2003  . [place]   for unknown reason.  They thought she had a mass.  . COLONOSCOPY  12/04/2010   Procedure: COLONOSCOPY;  Surgeon: Rogene Houston, MD;  Location: [facility];  Service: Endoscopy;  Laterality: N/A;  3:00  . COLONOSCOPY N/A 01/18/2015   Procedure: COLONOSCOPY;  Surgeon: Rogene Houston, MD;  Location: [facility];  Service: Endoscopy;  Laterality: N/A;  955 - moved to 10:40 - Ann notified pt    FAMILY HISTORY:  Family History  Adopted: Yes    SOCIAL HISTORY: Social History   Socioeconomic History  . Marital status: Married    Spouse name: [name]  . Number of children: Not on file  . Years of education: Not on file  . Highest education level: High school graduate  Occupational History    Comment: retired  Scientific laboratory technician  . Financial resource strain: Not on file  . Food insecurity    Worry: Not on file    Inability: Not on file  . Transportation needs    Medical: Not on file    Non-medical: Not on file  Tobacco Use  . Smoking status: Former Smoker    Packs/day: 3.00    Years: 20.00  Pack years: 60.00  . Smokeless tobacco: Never Used  . Tobacco comment: Quit smoking in early 80s after smoking 20 yrs,.  Substance and Sexual Activity  . Alcohol use: Yes    Alcohol/week: 0.0 standard drinks    Comment: very rare occasion on a holiday  . Drug use: No  . Sexual activity: Not Currently  Lifestyle  . Physical activity    Days per week: Not on file    Minutes per session: Not on file  . Stress: Not on file  Relationships  . Social Herbalist on phone: Not on file    Gets together: Not on file    Attends religious service: Not on file    Active member of club or organization: Not on file    Attends meetings of clubs or organizations: Not on file    Relationship status: Not on file  . Intimate partner violence    Fear of current or ex partner: Not on file    Emotionally abused: Not on file    Physically abused: Not on file    Forced sexual activity: Not on file  Other Topics Concern  . Not on file  Social History Narrative   Lives with spouse   Caffeine- coffee 4-5 day     PHYSICAL EXAM  GENERAL EXAM/CONSTITUTIONAL: Vitals:  Vitals:   11/30/18 1240  BP: 130/72  Pulse: 88  Temp: (!) 96.9 F (36.1 C)  Weight: 244 lb 9.6 oz (110.9 kg)  Height: 5' 8.5" (1.74 m)   Body mass index is 36.65 kg/m. Wt Readings from Last 3 Encounters:  11/30/18 244 lb 9.6  oz (110.9 kg)  10/30/18 252 lb 3.3 oz (114.4 kg)  10/04/18 250 lb 3.2 oz (113.5 kg)    Patient is in no distress; well developed, nourished and groomed; neck is supple  CARDIOVASCULAR:  Examination of carotid arteries is normal; no carotid bruits  Regular rate and rhythm, no murmurs  Examination of peripheral vascular system by observation and palpation is normal  EYES:  Ophthalmoscopic exam of optic discs and posterior segments is normal; no papilledema or hemorrhages No exam data present  MUSCULOSKELETAL:  Gait, strength, tone, movements noted in Neurologic exam below  NEUROLOGIC: MENTAL STATUS:  No flowsheet data found.  awake, alert, oriented to person, place and time  recent and remote memory intact  normal attention and concentration  language fluent, comprehension intact, naming intact  fund of knowledge appropriate  CRANIAL NERVE:   2nd - no papilledema on fundoscopic exam  2nd, 3rd, 4th, 6th - pupils equal and reactive to light, visual fields full to confrontation, extraocular muscles intact, no nystagmus  5th - facial sensation symmetric  7th - facial strength symmetric  8th - hearing intact  9th - palate elevates symmetrically, uvula midline  11th - shoulder shrug symmetric  12th - tongue protrusion midline  MOTOR:   normal bulk and tone, full strength in the BUE, BLE  SENSORY:   normal and symmetric to light touch; DECR IN FEET / ANKLES  COORDINATION:   finger-nose-finger, fine finger movements normal  REFLEXES:   deep tendon reflexes TRACE and symmetric; ABSENT AT ANKLES  GAIT/STATION:   narrow based gait; USING SINGLE POINT CANE      DIAGNOSTIC DATA (LABS, IMAGING, TESTING) - I reviewed patient records, labs, notes, testing and imaging myself where available.  Lab Results  Component Value Date   WBC 13.6 (H) 10/31/2018   HGB 10.9 (L) 10/31/2018  HCT 35.5 (L) 10/31/2018   MCV 95.2 10/31/2018   PLT 265 10/31/2018       Component Value Date/Time   NA 139 10/31/2018 0557   NA 137 01/06/2016 0814   K 3.8 10/31/2018 0557   CL 108 10/31/2018 0557   CO2 22 10/31/2018 0557   GLUCOSE 132 (H) 10/31/2018 0557   BUN 13 10/31/2018 0557   BUN 12 01/06/2016 0814   CREATININE 0.90 10/31/2018 0557   CREATININE 0.80 01/17/2014 1001   CALCIUM 8.1 (L) 10/31/2018 0557   PROT 7.0 10/30/2018 0558   PROT 6.5 01/06/2016 0814   ALBUMIN 3.5 10/30/2018 0558   ALBUMIN 4.1 01/06/2016 0814   AST 48 (H) 10/30/2018 0558   ALT 32 10/30/2018 0558   ALKPHOS 63 10/30/2018 0558   BILITOT 0.5 10/30/2018 0558   BILITOT 0.3 01/06/2016 0814   GFRNONAA >60 10/31/2018 0557   GFRAA >60 10/31/2018 0557   Lab Results  Component Value Date   CHOL 231 (H) 01/06/2016   HDL 53 01/06/2016   LDLCALC 108 (H) 01/06/2016   TRIG 352 (H) 01/06/2016   CHOLHDL 4.4 01/06/2016   Lab Results  Component Value Date   HGBA1C 8.1 01/01/2016   No results found for: VITAMINB12 No results found for: TSH   03/09/18 MRI brain 1. No acute intracranial process or structural cause of seizure identified. 2. Moderate to severe chronic microvascular ischemic changes and moderate volume loss of the brain.    ASSESSMENT AND PLAN  73 y.o. year old female here with   Dx:  1. Seizure disorder ([place])   2. Chronic bilateral low back pain without sciatica     PLAN:  SEIZURE DISORDER (last event Sept 25, 2020) - increase levetiracetam to 1000mg  twice a day  - According to [facility] law, you can not drive unless you are seizure / syncope free for at least 6 months and under physician's care.   - Please maintain precautions. Do not participate in activities where a loss of awareness could harm you or someone else. No swimming alone, no tub bathing, no hot tubs, no driving, no operating motorized vehicles (cars, ATVs, motocycles, etc), lawnmowers, power tools or firearms. No standing at heights, such as rooftops, ladders or stairs. Avoid hot objects such as  stoves, heaters, open fires. Wear a helmet when riding a bicycle, scooter, skateboard, etc. and avoid areas of traffic. Set your water heater to 120 degrees or less.   NUMBNESS IN FEET (diabetic neuropathy vs lumbar radiculopathy) - follow up PT evaluation - may consider MRI lumbar spine in future  Meds ordered this encounter  Medications  . levETIRAcetam (KEPPRA) 1000 MG tablet    Sig: Take 1 tablet (1,000 mg total) by mouth 2 (two) times daily.    Dispense:  60 tablet    Refill:  12   Return in about 6 months (around 05/30/2019) for with NP (Amy Lomax).    Penni Bombard, MD [id number], [id number] PM Certified in Neurology, Neurophysiology and Neuroimaging  [facility] [address] [place], [place] [address] [phone]

## 2018-11-30 NOTE — Patient Instructions (Signed)
-   increase levetiracetam to 1000mg  twice a day

## 2018-12-02 DIAGNOSIS — N10 Acute tubulo-interstitial nephritis: Secondary | ICD-10-CM | POA: Diagnosis not present

## 2018-12-02 DIAGNOSIS — E782 Mixed hyperlipidemia: Secondary | ICD-10-CM | POA: Diagnosis not present

## 2018-12-02 DIAGNOSIS — R197 Diarrhea, unspecified: Secondary | ICD-10-CM | POA: Diagnosis not present

## 2018-12-02 DIAGNOSIS — J441 Chronic obstructive pulmonary disease with (acute) exacerbation: Secondary | ICD-10-CM | POA: Diagnosis not present

## 2018-12-02 DIAGNOSIS — G4089 Other seizures: Secondary | ICD-10-CM | POA: Diagnosis not present

## 2018-12-02 DIAGNOSIS — E1169 Type 2 diabetes mellitus with other specified complication: Secondary | ICD-10-CM | POA: Diagnosis not present

## 2018-12-02 DIAGNOSIS — I10 Essential (primary) hypertension: Secondary | ICD-10-CM | POA: Diagnosis not present

## 2018-12-02 DIAGNOSIS — A419 Sepsis, unspecified organism: Secondary | ICD-10-CM | POA: Diagnosis not present

## 2018-12-02 DIAGNOSIS — Z8673 Personal history of transient ischemic attack (TIA), and cerebral infarction without residual deficits: Secondary | ICD-10-CM | POA: Diagnosis not present

## 2018-12-08 DIAGNOSIS — E1165 Type 2 diabetes mellitus with hyperglycemia: Secondary | ICD-10-CM | POA: Diagnosis not present

## 2018-12-08 DIAGNOSIS — E785 Hyperlipidemia, unspecified: Secondary | ICD-10-CM | POA: Diagnosis not present

## 2018-12-08 DIAGNOSIS — D509 Iron deficiency anemia, unspecified: Secondary | ICD-10-CM | POA: Diagnosis not present

## 2018-12-08 DIAGNOSIS — E1169 Type 2 diabetes mellitus with other specified complication: Secondary | ICD-10-CM | POA: Diagnosis not present

## 2018-12-08 DIAGNOSIS — E782 Mixed hyperlipidemia: Secondary | ICD-10-CM | POA: Diagnosis not present

## 2018-12-08 DIAGNOSIS — I10 Essential (primary) hypertension: Secondary | ICD-10-CM | POA: Diagnosis not present

## 2018-12-12 DIAGNOSIS — E1169 Type 2 diabetes mellitus with other specified complication: Secondary | ICD-10-CM | POA: Diagnosis not present

## 2018-12-12 DIAGNOSIS — I10 Essential (primary) hypertension: Secondary | ICD-10-CM | POA: Diagnosis not present

## 2018-12-12 DIAGNOSIS — E782 Mixed hyperlipidemia: Secondary | ICD-10-CM | POA: Diagnosis not present

## 2018-12-12 DIAGNOSIS — G4089 Other seizures: Secondary | ICD-10-CM | POA: Diagnosis not present

## 2018-12-12 DIAGNOSIS — L989 Disorder of the skin and subcutaneous tissue, unspecified: Secondary | ICD-10-CM | POA: Diagnosis not present

## 2018-12-12 DIAGNOSIS — Z8673 Personal history of transient ischemic attack (TIA), and cerebral infarction without residual deficits: Secondary | ICD-10-CM | POA: Diagnosis not present

## 2018-12-12 DIAGNOSIS — M25572 Pain in left ankle and joints of left foot: Secondary | ICD-10-CM | POA: Diagnosis not present

## 2018-12-12 DIAGNOSIS — N3281 Overactive bladder: Secondary | ICD-10-CM | POA: Diagnosis not present

## 2018-12-12 DIAGNOSIS — E875 Hyperkalemia: Secondary | ICD-10-CM | POA: Diagnosis not present

## 2018-12-13 ENCOUNTER — Other Ambulatory Visit (HOSPITAL_COMMUNITY): Payer: Self-pay | Admitting: Internal Medicine

## 2018-12-13 DIAGNOSIS — Z1231 Encounter for screening mammogram for malignant neoplasm of breast: Secondary | ICD-10-CM

## 2018-12-14 DIAGNOSIS — E119 Type 2 diabetes mellitus without complications: Secondary | ICD-10-CM | POA: Diagnosis not present

## 2019-01-04 ENCOUNTER — Other Ambulatory Visit: Payer: Self-pay

## 2019-01-04 ENCOUNTER — Encounter (HOSPITAL_COMMUNITY): Payer: Self-pay | Admitting: Emergency Medicine

## 2019-01-04 ENCOUNTER — Telehealth: Payer: Self-pay | Admitting: Diagnostic Neuroimaging

## 2019-01-04 ENCOUNTER — Emergency Department (HOSPITAL_COMMUNITY)
Admission: EM | Admit: 2019-01-04 | Discharge: 2019-01-04 | Disposition: A | Discharge status: Home / Self Care | Payer: Medicare HMO | Attending: Emergency Medicine | Admitting: Emergency Medicine

## 2019-01-04 DIAGNOSIS — Z5321 Procedure and treatment not carried out due to patient leaving prior to being seen by health care provider: Secondary | ICD-10-CM | POA: Insufficient documentation

## 2019-01-04 DIAGNOSIS — R569 Unspecified convulsions: Secondary | ICD-10-CM | POA: Diagnosis present

## 2019-01-04 LAB — CBC WITH DIFFERENTIAL/PLATELET
Abs Immature Granulocytes: 0.04 10*3/uL (ref 0.00–0.07)
Basophils Absolute: 0.1 10*3/uL (ref 0.0–0.1)
Basophils Relative: 1 %
Eosinophils Absolute: 0.4 10*3/uL (ref 0.0–0.5)
Eosinophils Relative: 3 %
HCT: 43.8 % (ref 36.0–46.0)
Hemoglobin: 13.5 g/dL (ref 12.0–15.0)
Immature Granulocytes: 0 %
Lymphocytes Relative: 40 %
Lymphs Abs: 4.4 10*3/uL — ABNORMAL HIGH (ref 0.7–4.0)
MCH: 28.1 pg (ref 26.0–34.0)
MCHC: 30.8 g/dL (ref 30.0–36.0)
MCV: 91.3 fL (ref 80.0–100.0)
Monocytes Absolute: 0.8 10*3/uL (ref 0.1–1.0)
Monocytes Relative: 7 %
Neutro Abs: 5.3 10*3/uL (ref 1.7–7.7)
Neutrophils Relative %: 49 %
Platelets: 354 10*3/uL (ref 150–400)
RBC: 4.8 MIL/uL (ref 3.87–5.11)
RDW: 13.9 % (ref 11.5–15.5)
WBC: 11 10*3/uL — ABNORMAL HIGH (ref 4.0–10.5)
nRBC: 0 % (ref 0.0–0.2)

## 2019-01-04 LAB — BASIC METABOLIC PANEL
Anion gap: 10 (ref 5–15)
BUN: 15 mg/dL (ref 8–23)
CO2: 27 mmol/L (ref 22–32)
Calcium: 9.6 mg/dL (ref 8.9–10.3)
Chloride: 102 mmol/L (ref 98–111)
Creatinine, Ser: 0.79 mg/dL (ref 0.44–1.00)
GFR calc Af Amer: >60 mL/min (ref >60)
GFR calc non Af Amer: >60 mL/min (ref >60)
Glucose, Bld: 66 mg/dL — ABNORMAL LOW (ref 70–99)
Potassium: 4.2 mmol/L (ref 3.5–5.1)
Sodium: 139 mmol/L (ref 135–145)

## 2019-01-04 NOTE — Telephone Encounter (Signed)
LVM requesting call back to discuss.

## 2019-01-04 NOTE — Telephone Encounter (Signed)
Pt states her husband told her that last night she had 2 seizure like episodes last night.  Please call

## 2019-01-04 NOTE — ED Triage Notes (Signed)
Pt states she has a seizure last night in bed.  Unknown duration.  Denies any falls.  On meds currently and denies missing any doses

## 2019-01-04 NOTE — ED Notes (Signed)
Pt updated on wait time, given warm blanket

## 2019-01-05 ENCOUNTER — Ambulatory Visit (HOSPITAL_COMMUNITY)
Admission: RE | Admit: 2019-01-05 | Discharge: 2019-01-05 | Disposition: A | Discharge status: Home / Self Care | Payer: Medicare HMO | Source: Ambulatory Visit | Attending: Internal Medicine | Admitting: Internal Medicine

## 2019-01-05 DIAGNOSIS — Z1231 Encounter for screening mammogram for malignant neoplasm of breast: Secondary | ICD-10-CM | POA: Diagnosis not present

## 2019-01-05 MED ORDER — LEVETIRACETAM 750 MG PO TABS: 1500.0000 mg | ORAL_TABLET | Freq: Two times a day (BID) | ORAL | 12 refills | Status: DC

## 2019-01-05 NOTE — Telephone Encounter (Signed)
Spoke with patient and advised Dr Leta Baptist has increased levetiracetam to 1500 mg twice daily and sent in new Rx for 750 mg tabs, take two twice daily.  She stated she will take three of her 500 mg tabs twice daily until they are gone. She will pick up new Rx for 750mg  tabs. I advised she call for any questions or problems. She  verbalized understanding, appreciation.

## 2019-01-05 NOTE — Telephone Encounter (Addendum)
Patient returned call and stated her husband said her eyes rolled into back of her head, but the episode didn't last long. She had a second episode and stated "my eyes didn't roll back but,  I was going out again". She is taking levetiracetam 1000 mg twice daily as Dr Leta Baptist prescribed 11/30/18. I advised will discuss with him and call her back this afternoon. She stated to call her cell [phone]. Patient verbalized understanding, appreciation.

## 2019-01-05 NOTE — Telephone Encounter (Signed)
Increase LEV to 1500mg  twice a day.    Meds ordered this encounter  Medications  . levETIRAcetam (KEPPRA) 750 MG tablet    Sig: Take 2 tablets (1,500 mg total) by mouth 2 (two) times daily.    Dispense:  120 tablet    Refill:  [place], [place] [id number], [id number] PM Certified in Neurology, Neurophysiology and Neuroimaging  [facility] [address] [place], [place] [address] [phone]

## 2019-01-05 NOTE — Addendum Note (Signed)
Addended by: Andrey Spearman R on: 01/05/2019 04:57 PM   Modules accepted: Orders

## 2019-01-12 DIAGNOSIS — N3281 Overactive bladder: Secondary | ICD-10-CM | POA: Diagnosis not present

## 2019-01-12 DIAGNOSIS — M25572 Pain in left ankle and joints of left foot: Secondary | ICD-10-CM | POA: Diagnosis not present

## 2019-01-12 DIAGNOSIS — Z8673 Personal history of transient ischemic attack (TIA), and cerebral infarction without residual deficits: Secondary | ICD-10-CM | POA: Diagnosis not present

## 2019-01-12 DIAGNOSIS — L989 Disorder of the skin and subcutaneous tissue, unspecified: Secondary | ICD-10-CM | POA: Diagnosis not present

## 2019-01-12 DIAGNOSIS — I10 Essential (primary) hypertension: Secondary | ICD-10-CM | POA: Diagnosis not present

## 2019-01-12 DIAGNOSIS — E1169 Type 2 diabetes mellitus with other specified complication: Secondary | ICD-10-CM | POA: Diagnosis not present

## 2019-01-12 DIAGNOSIS — E875 Hyperkalemia: Secondary | ICD-10-CM | POA: Diagnosis not present

## 2019-01-12 DIAGNOSIS — G4089 Other seizures: Secondary | ICD-10-CM | POA: Diagnosis not present

## 2019-01-12 DIAGNOSIS — E782 Mixed hyperlipidemia: Secondary | ICD-10-CM | POA: Diagnosis not present

## 2019-01-23 DIAGNOSIS — M79672 Pain in left foot: Secondary | ICD-10-CM | POA: Diagnosis not present

## 2019-01-23 DIAGNOSIS — E1140 Type 2 diabetes mellitus with diabetic neuropathy, unspecified: Secondary | ICD-10-CM | POA: Diagnosis not present

## 2019-01-23 DIAGNOSIS — M79671 Pain in right foot: Secondary | ICD-10-CM | POA: Diagnosis not present

## 2019-01-23 DIAGNOSIS — L110 Acquired keratosis follicularis: Secondary | ICD-10-CM | POA: Diagnosis not present

## 2019-01-25 DIAGNOSIS — E119 Type 2 diabetes mellitus without complications: Secondary | ICD-10-CM | POA: Diagnosis not present

## 2019-02-09 DIAGNOSIS — E1169 Type 2 diabetes mellitus with other specified complication: Secondary | ICD-10-CM | POA: Diagnosis not present

## 2019-02-09 DIAGNOSIS — E782 Mixed hyperlipidemia: Secondary | ICD-10-CM | POA: Diagnosis not present

## 2019-02-09 DIAGNOSIS — I10 Essential (primary) hypertension: Secondary | ICD-10-CM | POA: Diagnosis not present

## 2019-02-09 DIAGNOSIS — Z8673 Personal history of transient ischemic attack (TIA), and cerebral infarction without residual deficits: Secondary | ICD-10-CM | POA: Diagnosis not present

## 2019-02-13 DIAGNOSIS — Z880 Allergy status to penicillin: Secondary | ICD-10-CM | POA: Diagnosis not present

## 2019-02-13 DIAGNOSIS — R32 Unspecified urinary incontinence: Secondary | ICD-10-CM | POA: Diagnosis not present

## 2019-02-13 DIAGNOSIS — E785 Hyperlipidemia, unspecified: Secondary | ICD-10-CM | POA: Diagnosis not present

## 2019-02-13 DIAGNOSIS — R569 Unspecified convulsions: Secondary | ICD-10-CM | POA: Diagnosis not present

## 2019-02-13 DIAGNOSIS — G8929 Other chronic pain: Secondary | ICD-10-CM | POA: Diagnosis not present

## 2019-02-13 DIAGNOSIS — E1151 Type 2 diabetes mellitus with diabetic peripheral angiopathy without gangrene: Secondary | ICD-10-CM | POA: Diagnosis not present

## 2019-02-13 DIAGNOSIS — Z794 Long term (current) use of insulin: Secondary | ICD-10-CM | POA: Diagnosis not present

## 2019-02-13 DIAGNOSIS — M1990 Unspecified osteoarthritis, unspecified site: Secondary | ICD-10-CM | POA: Diagnosis not present

## 2019-02-16 DIAGNOSIS — R102 Pelvic and perineal pain: Secondary | ICD-10-CM | POA: Diagnosis not present

## 2019-03-06 DIAGNOSIS — Z8673 Personal history of transient ischemic attack (TIA), and cerebral infarction without residual deficits: Secondary | ICD-10-CM | POA: Diagnosis not present

## 2019-03-06 DIAGNOSIS — E1169 Type 2 diabetes mellitus with other specified complication: Secondary | ICD-10-CM | POA: Diagnosis not present

## 2019-03-06 DIAGNOSIS — E7849 Other hyperlipidemia: Secondary | ICD-10-CM | POA: Diagnosis not present

## 2019-03-06 DIAGNOSIS — I10 Essential (primary) hypertension: Secondary | ICD-10-CM | POA: Diagnosis not present

## 2019-03-23 DIAGNOSIS — E119 Type 2 diabetes mellitus without complications: Secondary | ICD-10-CM | POA: Diagnosis not present

## 2019-03-23 DIAGNOSIS — I10 Essential (primary) hypertension: Secondary | ICD-10-CM | POA: Diagnosis not present

## 2019-03-23 DIAGNOSIS — E785 Hyperlipidemia, unspecified: Secondary | ICD-10-CM | POA: Diagnosis not present

## 2019-03-23 DIAGNOSIS — E782 Mixed hyperlipidemia: Secondary | ICD-10-CM | POA: Diagnosis not present

## 2019-03-23 DIAGNOSIS — E1169 Type 2 diabetes mellitus with other specified complication: Secondary | ICD-10-CM | POA: Diagnosis not present

## 2019-03-23 DIAGNOSIS — E1165 Type 2 diabetes mellitus with hyperglycemia: Secondary | ICD-10-CM | POA: Diagnosis not present

## 2019-03-27 DIAGNOSIS — E1169 Type 2 diabetes mellitus with other specified complication: Secondary | ICD-10-CM | POA: Diagnosis not present

## 2019-03-27 DIAGNOSIS — E782 Mixed hyperlipidemia: Secondary | ICD-10-CM | POA: Diagnosis not present

## 2019-03-27 DIAGNOSIS — Z8669 Personal history of other diseases of the nervous system and sense organs: Secondary | ICD-10-CM | POA: Diagnosis not present

## 2019-03-27 DIAGNOSIS — N3281 Overactive bladder: Secondary | ICD-10-CM | POA: Diagnosis not present

## 2019-03-27 DIAGNOSIS — L989 Disorder of the skin and subcutaneous tissue, unspecified: Secondary | ICD-10-CM | POA: Diagnosis not present

## 2019-03-27 DIAGNOSIS — G4089 Other seizures: Secondary | ICD-10-CM | POA: Diagnosis not present

## 2019-03-27 DIAGNOSIS — I10 Essential (primary) hypertension: Secondary | ICD-10-CM | POA: Diagnosis not present

## 2019-03-27 DIAGNOSIS — Z8673 Personal history of transient ischemic attack (TIA), and cerebral infarction without residual deficits: Secondary | ICD-10-CM | POA: Diagnosis not present

## 2019-03-27 DIAGNOSIS — E875 Hyperkalemia: Secondary | ICD-10-CM | POA: Diagnosis not present

## 2019-04-04 DIAGNOSIS — L989 Disorder of the skin and subcutaneous tissue, unspecified: Secondary | ICD-10-CM | POA: Diagnosis not present

## 2019-04-04 DIAGNOSIS — R2231 Localized swelling, mass and lump, right upper limb: Secondary | ICD-10-CM | POA: Diagnosis not present

## 2019-04-05 DIAGNOSIS — E782 Mixed hyperlipidemia: Secondary | ICD-10-CM | POA: Diagnosis not present

## 2019-04-05 DIAGNOSIS — Z8673 Personal history of transient ischemic attack (TIA), and cerebral infarction without residual deficits: Secondary | ICD-10-CM | POA: Diagnosis not present

## 2019-04-05 DIAGNOSIS — I10 Essential (primary) hypertension: Secondary | ICD-10-CM | POA: Diagnosis not present

## 2019-04-05 DIAGNOSIS — E1169 Type 2 diabetes mellitus with other specified complication: Secondary | ICD-10-CM | POA: Diagnosis not present

## 2019-04-10 ENCOUNTER — Ambulatory Visit: Payer: Medicare HMO | Admitting: Diagnostic Neuroimaging

## 2019-04-10 ENCOUNTER — Other Ambulatory Visit: Payer: Self-pay

## 2019-04-10 ENCOUNTER — Encounter: Payer: Self-pay | Admitting: Diagnostic Neuroimaging

## 2019-04-10 VITALS — BP 159/80 | HR 82 | Temp 97.1°F | Ht 68.5 in | Wt 249.2 lb

## 2019-04-10 DIAGNOSIS — G40909 Epilepsy, unspecified, not intractable, without status epilepticus: Secondary | ICD-10-CM | POA: Diagnosis not present

## 2019-04-10 MED ORDER — LEVETIRACETAM 750 MG PO TABS: 1500.0000 mg | ORAL_TABLET | Freq: Two times a day (BID) | ORAL | 4 refills | Status: DC

## 2019-04-10 NOTE — Progress Notes (Signed)
[facility]  PATIENT: Maria Esparza DOB: February 07, 1946  REFERRING CLINICIAN: [place] Esparza, J HISTORY FROM: patient and husband REASON FOR VISIT: follow up    HISTORICAL  CHIEF COMPLAINT:  Chief Complaint  Patient presents with  . Seizures    rm 7, 6 month FU husband- Maria Esparza " Nov 3rd 2 seizures; Dec 9th 1 seizure; Dec 30th I was dizzy, didn't seem like a seizure but I was off"    HISTORY OF PRESENT ILLNESS:   UPDATE (04/10/19, VRP): Since last visit, had sz on Nov 3, Dec 9, Dec 30. Now on LEV 1500mg  twice a day since Nov 2020. No alleviating or aggravating factors. Tolerating LEV 1500mg  twice a day.    UPDATE (11/30/18, VRP): Since last visit, was doing well until 10/30/18 --> felt cold at home, then had shaking and confusion at home for few minutes, then 2-3 hours of confusion, went to ER, and dx'd with sepsis / UTI and treated. Since then had 2 more sz events on 11/23/18 and 11/25/18 (few min of shaking arms, eyes open, no post-ictal confusion; no tongue biting; no incontinence).   UPDATE (10/04/18, VRP): Since last visit, doing here for evaluation of numbness and tingling in the feet.  Patient has had this problem for past 3 years.  Patient has diabetes which is under control with medication.  She also has some low back pain without radiating symptoms.  Here for evaluation of neuropathy versus lumbar radiculopathy.  PRIOR HPI (07/06/18): 74 year old female with hypertension, diabetes, hypercholesterolemia, here for evaluation of seizure.  December 2019 patient had episode of possibly back, color change in the face, shaking and convulsions.  No tongue biting or incontinence.  Episode lasted for 30 to 60 seconds.  Patient had evaluated by neurologist who ordered EEG and MRI.  Currently EEG was unremarkable.  MRI of the brain showed moderate to severe chronic small vessel ischemic disease and moderate atrophy.  Possibility of seizure versus TIA was raised.  Patient did well  until April 2020 when she had her second event, similar seizure-like activity.  This time patient was somewhat aware during the event, felt herself going into the shaking.  He was somewhat confused.  Patient had a 3rd event May 2020, also a similar type.  No prior history of seizures.  Patient did have history of head trauma in 2005 requiring multiple sutures in the scalp.   REVIEW OF SYSTEMS: Full 14 system review of systems performed and negative with exception of: as per HPI.   ALLERGIES: Allergies  Allergen Reactions  . Penicillins Other (See Comments)    Vomiting, ge 10 Can take cephal Did it involve swelling of the face/tongue/throat, SOB, or low BP? No Did it involve sudden or severe rash/hives, skin peeling, or any reaction on the inside of your mouth or nose? No Did you need to seek medical attention at a hospital or doctor's office? No When did it last happen? If all above answers are "NO", may proceed with cephalosporin use.   . Prinivil [Lisinopril] Cough    HOME MEDICATIONS: Outpatient Medications Prior to Visit  Medication Sig Dispense Refill  . acetaminophen (TYLENOL) 325 MG tablet Take 2 tablets (650 mg total) by mouth every 6 (six) hours as needed for mild pain, fever or headache (or Fever >/= 101). 12 tablet 0  . aspirin EC 81 MG tablet Take 1 tablet (81 mg total) by mouth daily with breakfast. 30 tablet 2  . calcium carbonate (OS-CAL) 600 MG TABS Take  600 mg by mouth 2 (two) times daily before a meal. With Vitamin D     . Ferrous Sulfate (IRON PO) Take 65 mg by mouth daily.    . fish oil-omega-3 fatty acids 1000 MG capsule Take 1 g by mouth daily.      . Insulin Degludec (TRESIBA) 100 UNIT/ML SOLN Inject 36 Units into the skin.    Marland Kitchen levETIRAcetam (KEPPRA) 750 MG tablet Take 2 tablets (1,500 mg total) by mouth 2 (two) times daily. 120 tablet 12  . metFORMIN (GLUCOPHAGE) 500 MG tablet 1 TABLET BY MOUTH TWICE DAILY WITH MEALS 60 tablet 5  . Multiple  Vitamins-Minerals (CENTRUM SILVER PO) Take by mouth.    . pioglitazone (ACTOS) 30 MG tablet Take 30 mg by mouth daily.    . rosuvastatin (CRESTOR) 10 MG tablet Take 10 mg by mouth daily.    Marland Kitchen oxybutynin (DITROPAN) 5 MG tablet Take 1 tablet (5 mg total) by mouth 2 (two) times daily. 60 tablet 2  . albuterol (PROVENTIL) (2.5 MG/3ML) 0.083% nebulizer solution Take 3 mLs (2.5 mg total) by nebulization every 6 (six) hours as needed for wheezing or shortness of breath (cough). Dx J44.1 (COPD) (Patient not taking: Reported on 11/30/2018) 75 mL 12  . gabapentin (NEURONTIN) 300 MG capsule Take 1 capsule (300 mg total) by mouth 3 (three) times daily. (Patient not taking: Reported on 11/30/2018) 90 capsule 6  . HYDROcodone-acetaminophen (NORCO/VICODIN) 5-325 MG tablet Take 1 tablet by mouth every 4 (four) hours as needed for moderate pain or severe pain. 8 tablet 0  . tiotropium (SPIRIVA HANDIHALER) 18 MCG inhalation capsule Place 1 capsule (18 mcg total) into inhaler and inhale daily. 30 capsule 2   No facility-administered medications prior to visit.    PAST MEDICAL HISTORY: Past Medical History:  Diagnosis Date  . Anemia   . Arthritis   . Asthma    mild  . Cough   . Diabetes mellitus type 2, uncomplicated (HCC)    x 10 yrs  . Diabetic neuropathy ([place]) rt foot  . Headache(784.0)   . Hypercholesteremia   . Hyperlipidemia   . Hypertension   . Hypertriglyceridemia 2002  . IBS (irritable bowel syndrome)   . Neuropathy   . Seizures ([place])    last sz 01/03/2019    PAST SURGICAL HISTORY: Past Surgical History:  Procedure Laterality Date  . APPENDECTOMY    . BREAST SURGERY  1999   left breast biopsy  . CERVICAL SPINE SURGERY  december 2003  . Gantt   for unknown reason.  They thought she had a mass.  . COLONOSCOPY  12/04/2010   Procedure: COLONOSCOPY;  Surgeon: Rogene Houston, MD;  Location: [facility];  Service: Endoscopy;  Laterality: N/A;  3:00  . COLONOSCOPY N/A  01/18/2015   Procedure: COLONOSCOPY;  Surgeon: Rogene Houston, MD;  Location: [facility];  Service: Endoscopy;  Laterality: N/A;  955 - moved to 10:40 - Ann notified pt    FAMILY HISTORY: Family History  Adopted: Yes    SOCIAL HISTORY: Social History   Socioeconomic History  . Marital status: Married    Spouse name: Maria Esparza  . Number of children: Not on file  . Years of education: Not on file  . Highest education level: High school graduate  Occupational History    Comment: retired  Tobacco Use  . Smoking status: Former Smoker    Packs/day: 3.00    Years: 20.00    Pack years:  60.00  . Smokeless tobacco: Never Used  . Tobacco comment: Quit smoking in early 80s after smoking 20 yrs,.  Substance and Sexual Activity  . Alcohol use: Yes    Alcohol/week: 0.0 standard drinks    Comment: very rare occasion on a holiday  . Drug use: No  . Sexual activity: Not Currently  Other Topics Concern  . Not on file  Social History Narrative   Lives with spouse   Caffeine- coffee 4-5 day   Social Determinants of Health   Financial Resource Strain:   . Difficulty of Paying Living Expenses: Not on file  Food Insecurity:   . Worried About Charity fundraiser in the Last Year: Not on file  . Ran Out of Food in the Last Year: Not on file  Transportation Needs:   . Lack of Transportation (Medical): Not on file  . Lack of Transportation (Non-Medical): Not on file  Physical Activity:   . Days of Exercise per Week: Not on file  . Minutes of Exercise per Session: Not on file  Stress:   . Feeling of Stress : Not on file  Social Connections:   . Frequency of Communication with Friends and Family: Not on file  . Frequency of Social Gatherings with Friends and Family: Not on file  . Attends Religious Services: Not on file  . Active Member of Clubs or Organizations: Not on file  . Attends Archivist Meetings: Not on file  . Marital Status: Not on file  Intimate Partner  Violence:   . Fear of Current or Ex-Partner: Not on file  . Emotionally Abused: Not on file  . Physically Abused: Not on file  . Sexually Abused: Not on file     PHYSICAL EXAM  GENERAL EXAM/CONSTITUTIONAL: Vitals:  Vitals:   04/10/19 1417  BP: (!) 159/80  Pulse: 82  Temp: (!) 97.1 F (36.2 C)  Weight: 249 lb 3.2 oz (113 kg)  Height: 5' 8.5" (1.74 m)   Body mass index is 37.34 kg/m. Wt Readings from Last 3 Encounters:  04/10/19 249 lb 3.2 oz (113 kg)  01/04/19 240 lb (108.9 kg)  11/30/18 244 lb 9.6 oz (110.9 kg)    Patient is in no distress; well developed, nourished and groomed; neck is supple  CARDIOVASCULAR:  Examination of carotid arteries is normal; no carotid bruits  Regular rate and rhythm, no murmurs  Examination of peripheral vascular system by observation and palpation is normal  EYES:  Ophthalmoscopic exam of optic discs and posterior segments is normal; no papilledema or hemorrhages No exam data present  MUSCULOSKELETAL:  Gait, strength, tone, movements noted in Neurologic exam below  NEUROLOGIC: MENTAL STATUS:  No flowsheet data found.  awake, alert, oriented to person, place and time  recent and remote memory intact  normal attention and concentration  language fluent, comprehension intact, naming intact  fund of knowledge appropriate  CRANIAL NERVE:   2nd - no papilledema on fundoscopic exam  2nd, 3rd, 4th, 6th - pupils equal and reactive to light, visual fields full to confrontation, extraocular muscles intact, no nystagmus  5th - facial sensation symmetric  7th - facial strength symmetric  8th - hearing intact  9th - palate elevates symmetrically, uvula midline  11th - shoulder shrug symmetric  12th - tongue protrusion midline  MOTOR:   normal bulk and tone, full strength in the BUE, BLE  SENSORY:   normal and symmetric to light touch; DECR IN FEET / ANKLES  COORDINATION:  finger-nose-finger, fine finger  movements normal  REFLEXES:   deep tendon reflexes TRACE and symmetric; ABSENT AT ANKLES  GAIT/STATION:   narrow based gait; USING SINGLE POINT CANE      DIAGNOSTIC DATA (LABS, IMAGING, TESTING) - I reviewed patient records, labs, notes, testing and imaging myself where available.  Lab Results  Component Value Date   WBC 11.0 (H) 01/04/2019   HGB 13.5 01/04/2019   HCT 43.8 01/04/2019   MCV 91.3 01/04/2019   PLT 354 01/04/2019      Component Value Date/Time   NA 139 01/04/2019 1459   NA 137 01/06/2016 0814   K 4.2 01/04/2019 1459   CL 102 01/04/2019 1459   CO2 27 01/04/2019 1459   GLUCOSE 66 (L) 01/04/2019 1459   BUN 15 01/04/2019 1459   BUN 12 01/06/2016 0814   CREATININE 0.79 01/04/2019 1459   CREATININE 0.80 01/17/2014 1001   CALCIUM 9.6 01/04/2019 1459   PROT 7.0 10/30/2018 0558   PROT 6.5 01/06/2016 0814   ALBUMIN 3.5 10/30/2018 0558   ALBUMIN 4.1 01/06/2016 0814   AST 48 (H) 10/30/2018 0558   ALT 32 10/30/2018 0558   ALKPHOS 63 10/30/2018 0558   BILITOT 0.5 10/30/2018 0558   BILITOT 0.3 01/06/2016 0814   GFRNONAA >60 01/04/2019 1459   GFRAA >60 01/04/2019 1459   Lab Results  Component Value Date   CHOL 231 (H) 01/06/2016   HDL 53 01/06/2016   LDLCALC 108 (H) 01/06/2016   TRIG 352 (H) 01/06/2016   CHOLHDL 4.4 01/06/2016   Lab Results  Component Value Date   HGBA1C 8.1 01/01/2016   No results found for: VITAMINB12 No results found for: TSH   03/09/18 MRI brain 1. No acute intracranial process or structural cause of seizure identified. 2. Moderate to severe chronic microvascular ischemic changes and moderate volume loss of the brain.    ASSESSMENT AND PLAN  74 y.o. year old female here with   Dx:  1. Seizure disorder [facility])     PLAN:  SEIZURE DISORDER (last event Nov 2020; possible Dec 2020) - continue levetiracetam 1500mg  twice a day  - According to [facility] law, you can not drive unless you are seizure / syncope free for at least 6  months and under physician's care.   - Please maintain precautions. Do not participate in activities where a loss of awareness could harm you or someone else. No swimming alone, no tub bathing, no hot tubs, no driving, no operating motorized vehicles (cars, ATVs, motocycles, etc), lawnmowers, power tools or firearms. No standing at heights, such as rooftops, ladders or stairs. Avoid hot objects such as stoves, heaters, open fires. Wear a helmet when riding a bicycle, scooter, skateboard, etc. and avoid areas of traffic. Set your water heater to 120 degrees or less.   NUMBNESS IN FEET (diabetic neuropathy vs lumbar radiculopathy) - follow up PT evaluation - may consider MRI lumbar spine in future  Meds ordered this encounter  Medications  . levETIRAcetam (KEPPRA) 750 MG tablet    Sig: Take 2 tablets (1,500 mg total) by mouth 2 (two) times daily.    Dispense:  360 tablet    Refill:  4   Return in about 8 months (around 12/08/2019) for with NP (Amy Lomax).    Penni Bombard, MD [id number], [id number] PM Certified in Neurology, Neurophysiology and Neuroimaging  [facility] [address] [place], [place] [address] [phone]

## 2019-04-10 NOTE — Patient Instructions (Signed)
SEIZURE DISORDER (last event Nov 2020; possible Dec 2020) - continue levetiracetam 1500mg  twice a day  - According to [facility] law, you can not drive unless you are seizure / syncope free for at least 6 months and under physician's care.   - Please maintain precautions. Do not participate in activities where a loss of awareness could harm you or someone else. No swimming alone, no tub bathing, no hot tubs, no driving, no operating motorized vehicles (cars, ATVs, motocycles, etc), lawnmowers, power tools or firearms. No standing at heights, such as rooftops, ladders or stairs. Avoid hot objects such as stoves, heaters, open fires. Wear a helmet when riding a bicycle, scooter, skateboard, etc. and avoid areas of traffic. Set your water heater to 120 degrees or less.

## 2019-04-11 DIAGNOSIS — L110 Acquired keratosis follicularis: Secondary | ICD-10-CM | POA: Diagnosis not present

## 2019-04-11 DIAGNOSIS — E1140 Type 2 diabetes mellitus with diabetic neuropathy, unspecified: Secondary | ICD-10-CM | POA: Diagnosis not present

## 2019-04-11 DIAGNOSIS — M79672 Pain in left foot: Secondary | ICD-10-CM | POA: Diagnosis not present

## 2019-04-11 DIAGNOSIS — M79671 Pain in right foot: Secondary | ICD-10-CM | POA: Diagnosis not present

## 2019-05-03 DIAGNOSIS — E119 Type 2 diabetes mellitus without complications: Secondary | ICD-10-CM | POA: Diagnosis not present

## 2019-05-12 DIAGNOSIS — I10 Essential (primary) hypertension: Secondary | ICD-10-CM | POA: Diagnosis not present

## 2019-05-12 DIAGNOSIS — Z8673 Personal history of transient ischemic attack (TIA), and cerebral infarction without residual deficits: Secondary | ICD-10-CM | POA: Diagnosis not present

## 2019-05-12 DIAGNOSIS — E1169 Type 2 diabetes mellitus with other specified complication: Secondary | ICD-10-CM | POA: Diagnosis not present

## 2019-05-12 DIAGNOSIS — E782 Mixed hyperlipidemia: Secondary | ICD-10-CM | POA: Diagnosis not present

## 2019-05-31 DIAGNOSIS — Z0100 Encounter for examination of eyes and vision without abnormal findings: Secondary | ICD-10-CM | POA: Diagnosis not present

## 2019-06-14 DIAGNOSIS — E1169 Type 2 diabetes mellitus with other specified complication: Secondary | ICD-10-CM | POA: Diagnosis not present

## 2019-06-14 DIAGNOSIS — Z8673 Personal history of transient ischemic attack (TIA), and cerebral infarction without residual deficits: Secondary | ICD-10-CM | POA: Diagnosis not present

## 2019-06-14 DIAGNOSIS — I10 Essential (primary) hypertension: Secondary | ICD-10-CM | POA: Diagnosis not present

## 2019-06-14 DIAGNOSIS — E782 Mixed hyperlipidemia: Secondary | ICD-10-CM | POA: Diagnosis not present

## 2019-07-04 DIAGNOSIS — E1140 Type 2 diabetes mellitus with diabetic neuropathy, unspecified: Secondary | ICD-10-CM | POA: Diagnosis not present

## 2019-07-04 DIAGNOSIS — M79672 Pain in left foot: Secondary | ICD-10-CM | POA: Diagnosis not present

## 2019-07-04 DIAGNOSIS — L110 Acquired keratosis follicularis: Secondary | ICD-10-CM | POA: Diagnosis not present

## 2019-07-04 DIAGNOSIS — M79671 Pain in right foot: Secondary | ICD-10-CM | POA: Diagnosis not present

## 2019-07-10 DIAGNOSIS — E1169 Type 2 diabetes mellitus with other specified complication: Secondary | ICD-10-CM | POA: Diagnosis not present

## 2019-07-10 DIAGNOSIS — E782 Mixed hyperlipidemia: Secondary | ICD-10-CM | POA: Diagnosis not present

## 2019-07-10 DIAGNOSIS — Z8673 Personal history of transient ischemic attack (TIA), and cerebral infarction without residual deficits: Secondary | ICD-10-CM | POA: Diagnosis not present

## 2019-07-10 DIAGNOSIS — I10 Essential (primary) hypertension: Secondary | ICD-10-CM | POA: Diagnosis not present

## 2019-07-13 ENCOUNTER — Other Ambulatory Visit: Payer: Self-pay

## 2019-07-13 ENCOUNTER — Ambulatory Visit (HOSPITAL_COMMUNITY)
Admission: RE | Admit: 2019-07-13 | Discharge: 2019-07-13 | Disposition: A | Discharge status: Home / Self Care | Payer: Medicare HMO | Source: Ambulatory Visit | Attending: Internal Medicine | Admitting: Internal Medicine

## 2019-07-13 ENCOUNTER — Other Ambulatory Visit (HOSPITAL_COMMUNITY): Payer: Self-pay | Admitting: Internal Medicine

## 2019-07-13 DIAGNOSIS — R079 Chest pain, unspecified: Secondary | ICD-10-CM | POA: Diagnosis not present

## 2019-07-13 DIAGNOSIS — R110 Nausea: Secondary | ICD-10-CM | POA: Diagnosis not present

## 2019-07-13 DIAGNOSIS — R0781 Pleurodynia: Secondary | ICD-10-CM | POA: Diagnosis not present

## 2019-07-13 DIAGNOSIS — K219 Gastro-esophageal reflux disease without esophagitis: Secondary | ICD-10-CM | POA: Diagnosis not present

## 2019-08-03 DIAGNOSIS — E7849 Other hyperlipidemia: Secondary | ICD-10-CM | POA: Diagnosis not present

## 2019-08-03 DIAGNOSIS — E119 Type 2 diabetes mellitus without complications: Secondary | ICD-10-CM | POA: Diagnosis not present

## 2019-08-03 DIAGNOSIS — E1340 Other specified diabetes mellitus with diabetic neuropathy, unspecified: Secondary | ICD-10-CM | POA: Diagnosis not present

## 2019-08-03 DIAGNOSIS — E782 Mixed hyperlipidemia: Secondary | ICD-10-CM | POA: Diagnosis not present

## 2019-08-03 DIAGNOSIS — E785 Hyperlipidemia, unspecified: Secondary | ICD-10-CM | POA: Diagnosis not present

## 2019-08-03 DIAGNOSIS — D509 Iron deficiency anemia, unspecified: Secondary | ICD-10-CM | POA: Diagnosis not present

## 2019-08-03 DIAGNOSIS — A419 Sepsis, unspecified organism: Secondary | ICD-10-CM | POA: Diagnosis not present

## 2019-08-03 DIAGNOSIS — E1169 Type 2 diabetes mellitus with other specified complication: Secondary | ICD-10-CM | POA: Diagnosis not present

## 2019-08-03 DIAGNOSIS — E1165 Type 2 diabetes mellitus with hyperglycemia: Secondary | ICD-10-CM | POA: Diagnosis not present

## 2019-08-07 DIAGNOSIS — G4089 Other seizures: Secondary | ICD-10-CM | POA: Diagnosis not present

## 2019-08-07 DIAGNOSIS — Z8673 Personal history of transient ischemic attack (TIA), and cerebral infarction without residual deficits: Secondary | ICD-10-CM | POA: Diagnosis not present

## 2019-08-07 DIAGNOSIS — Z8669 Personal history of other diseases of the nervous system and sense organs: Secondary | ICD-10-CM | POA: Diagnosis not present

## 2019-08-07 DIAGNOSIS — N3281 Overactive bladder: Secondary | ICD-10-CM | POA: Diagnosis not present

## 2019-08-07 DIAGNOSIS — E782 Mixed hyperlipidemia: Secondary | ICD-10-CM | POA: Diagnosis not present

## 2019-08-07 DIAGNOSIS — E875 Hyperkalemia: Secondary | ICD-10-CM | POA: Diagnosis not present

## 2019-08-07 DIAGNOSIS — I10 Essential (primary) hypertension: Secondary | ICD-10-CM | POA: Diagnosis not present

## 2019-08-07 DIAGNOSIS — E1169 Type 2 diabetes mellitus with other specified complication: Secondary | ICD-10-CM | POA: Diagnosis not present

## 2019-08-07 DIAGNOSIS — L989 Disorder of the skin and subcutaneous tissue, unspecified: Secondary | ICD-10-CM | POA: Diagnosis not present

## 2019-08-08 DIAGNOSIS — R69 Illness, unspecified: Secondary | ICD-10-CM | POA: Diagnosis not present

## 2019-08-16 DIAGNOSIS — Z8673 Personal history of transient ischemic attack (TIA), and cerebral infarction without residual deficits: Secondary | ICD-10-CM | POA: Diagnosis not present

## 2019-08-16 DIAGNOSIS — E1169 Type 2 diabetes mellitus with other specified complication: Secondary | ICD-10-CM | POA: Diagnosis not present

## 2019-08-16 DIAGNOSIS — E782 Mixed hyperlipidemia: Secondary | ICD-10-CM | POA: Diagnosis not present

## 2019-08-16 DIAGNOSIS — I10 Essential (primary) hypertension: Secondary | ICD-10-CM | POA: Diagnosis not present

## 2019-08-31 ENCOUNTER — Other Ambulatory Visit: Payer: Self-pay

## 2019-08-31 ENCOUNTER — Encounter (HOSPITAL_COMMUNITY): Payer: Self-pay

## 2019-08-31 ENCOUNTER — Emergency Department (HOSPITAL_COMMUNITY): Payer: Medicare HMO

## 2019-08-31 ENCOUNTER — Emergency Department (HOSPITAL_COMMUNITY)
Admission: EM | Admit: 2019-08-31 | Discharge: 2019-09-01 | Disposition: A | Discharge status: Home / Self Care | Payer: Medicare HMO | Attending: Emergency Medicine | Admitting: Emergency Medicine

## 2019-08-31 DIAGNOSIS — Z5321 Procedure and treatment not carried out due to patient leaving prior to being seen by health care provider: Secondary | ICD-10-CM | POA: Diagnosis not present

## 2019-08-31 DIAGNOSIS — R509 Fever, unspecified: Secondary | ICD-10-CM

## 2019-08-31 DIAGNOSIS — Z8673 Personal history of transient ischemic attack (TIA), and cerebral infarction without residual deficits: Secondary | ICD-10-CM | POA: Diagnosis not present

## 2019-08-31 DIAGNOSIS — E1169 Type 2 diabetes mellitus with other specified complication: Secondary | ICD-10-CM | POA: Diagnosis not present

## 2019-08-31 DIAGNOSIS — R05 Cough: Secondary | ICD-10-CM | POA: Insufficient documentation

## 2019-08-31 DIAGNOSIS — J9811 Atelectasis: Secondary | ICD-10-CM | POA: Diagnosis not present

## 2019-08-31 DIAGNOSIS — I10 Essential (primary) hypertension: Secondary | ICD-10-CM | POA: Diagnosis not present

## 2019-08-31 DIAGNOSIS — E782 Mixed hyperlipidemia: Secondary | ICD-10-CM | POA: Diagnosis not present

## 2019-08-31 NOTE — ED Triage Notes (Signed)
Pt presents to ED with complaints of non productive cough and fever up to 102 started today.

## 2019-09-04 DIAGNOSIS — E782 Mixed hyperlipidemia: Secondary | ICD-10-CM | POA: Diagnosis not present

## 2019-09-04 DIAGNOSIS — J069 Acute upper respiratory infection, unspecified: Secondary | ICD-10-CM | POA: Diagnosis not present

## 2019-09-04 DIAGNOSIS — R69 Illness, unspecified: Secondary | ICD-10-CM | POA: Diagnosis not present

## 2019-09-07 DIAGNOSIS — I10 Essential (primary) hypertension: Secondary | ICD-10-CM | POA: Diagnosis not present

## 2019-09-07 DIAGNOSIS — J069 Acute upper respiratory infection, unspecified: Secondary | ICD-10-CM | POA: Diagnosis not present

## 2019-09-07 DIAGNOSIS — E782 Mixed hyperlipidemia: Secondary | ICD-10-CM | POA: Diagnosis not present

## 2019-09-18 DIAGNOSIS — R69 Illness, unspecified: Secondary | ICD-10-CM | POA: Diagnosis not present

## 2019-09-21 DIAGNOSIS — L110 Acquired keratosis follicularis: Secondary | ICD-10-CM | POA: Diagnosis not present

## 2019-09-21 DIAGNOSIS — M79671 Pain in right foot: Secondary | ICD-10-CM | POA: Diagnosis not present

## 2019-09-21 DIAGNOSIS — M79672 Pain in left foot: Secondary | ICD-10-CM | POA: Diagnosis not present

## 2019-09-21 DIAGNOSIS — E1140 Type 2 diabetes mellitus with diabetic neuropathy, unspecified: Secondary | ICD-10-CM | POA: Diagnosis not present

## 2019-10-03 DIAGNOSIS — I10 Essential (primary) hypertension: Secondary | ICD-10-CM | POA: Diagnosis not present

## 2019-10-03 DIAGNOSIS — E782 Mixed hyperlipidemia: Secondary | ICD-10-CM | POA: Diagnosis not present

## 2019-10-03 DIAGNOSIS — Z8673 Personal history of transient ischemic attack (TIA), and cerebral infarction without residual deficits: Secondary | ICD-10-CM | POA: Diagnosis not present

## 2019-10-03 DIAGNOSIS — E1169 Type 2 diabetes mellitus with other specified complication: Secondary | ICD-10-CM | POA: Diagnosis not present

## 2019-10-03 IMAGING — MG DIGITAL SCREENING BILATERAL MAMMOGRAM WITH TOMO AND CAD
6 of 12 series · 6 of 36 positions shown · non-contrast
Comparison: Previous exam(s).

ACR Breast Density Category a: The breast tissue is almost entirely
fatty.

CLINICAL DATA: Screening.

EXAM:
DIGITAL SCREENING BILATERAL MAMMOGRAM WITH TOMO AND CAD

[L CC synth-2D (1 of 2)]
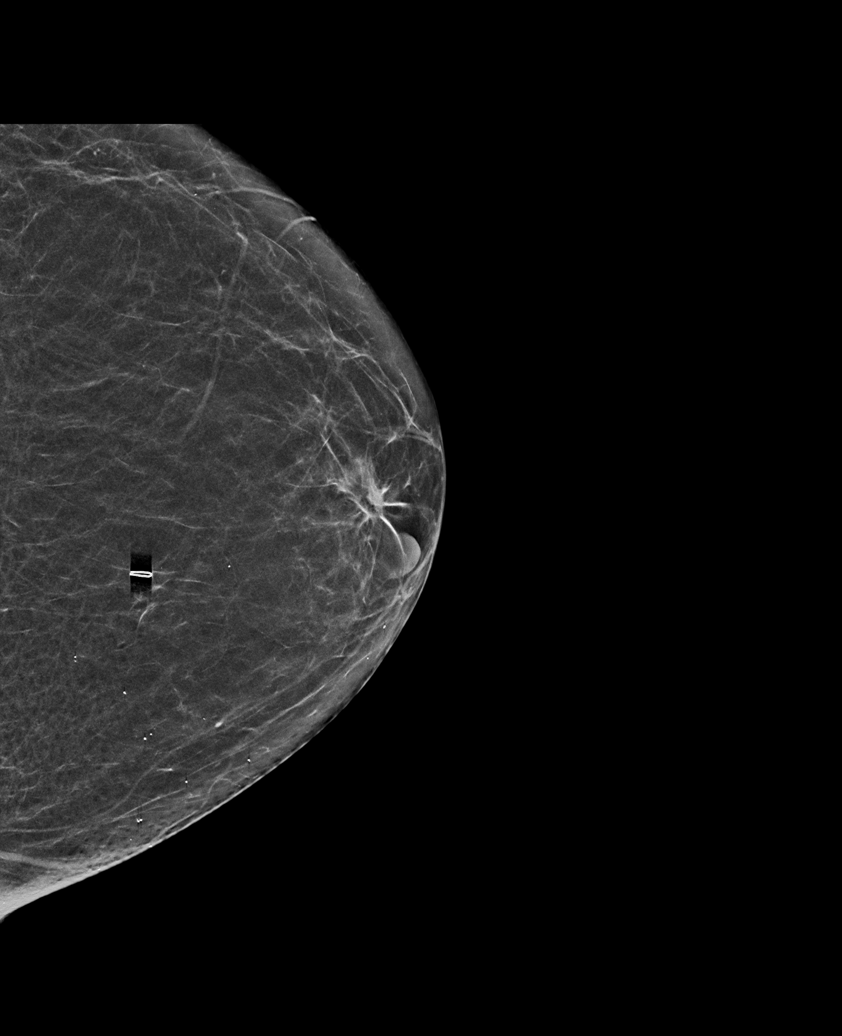

[L CC synth-2D (2 of 2)]
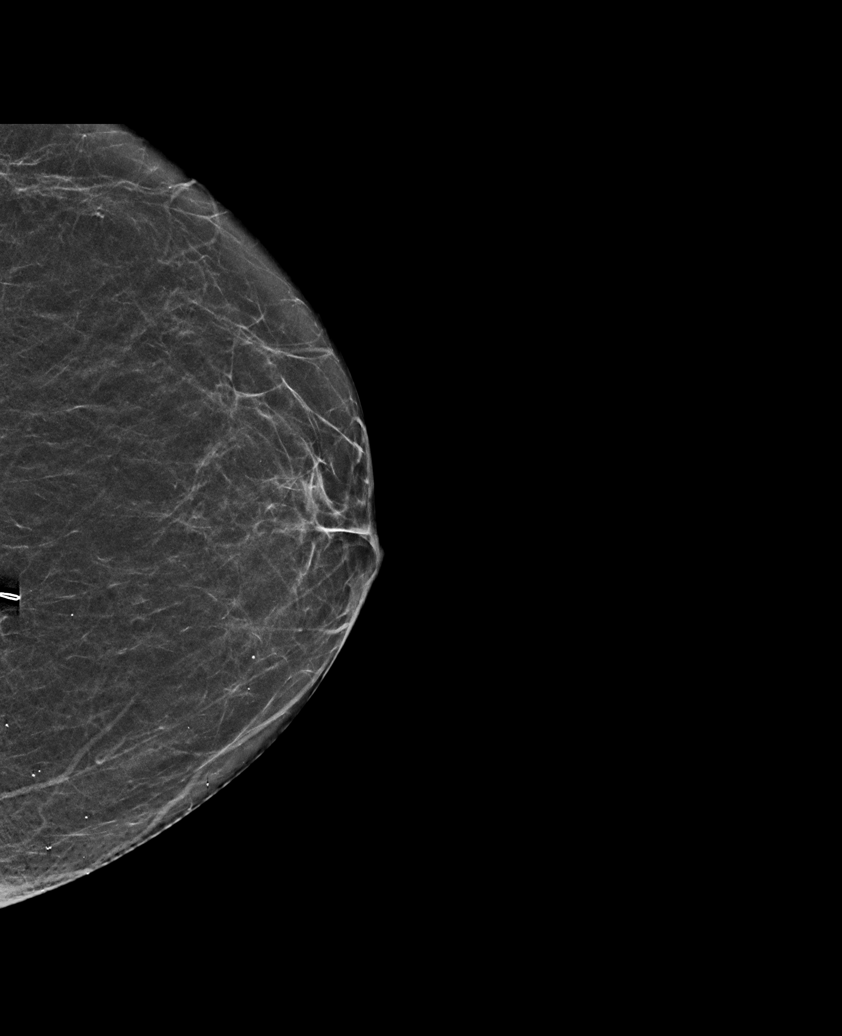

[L MLO synth-2D]
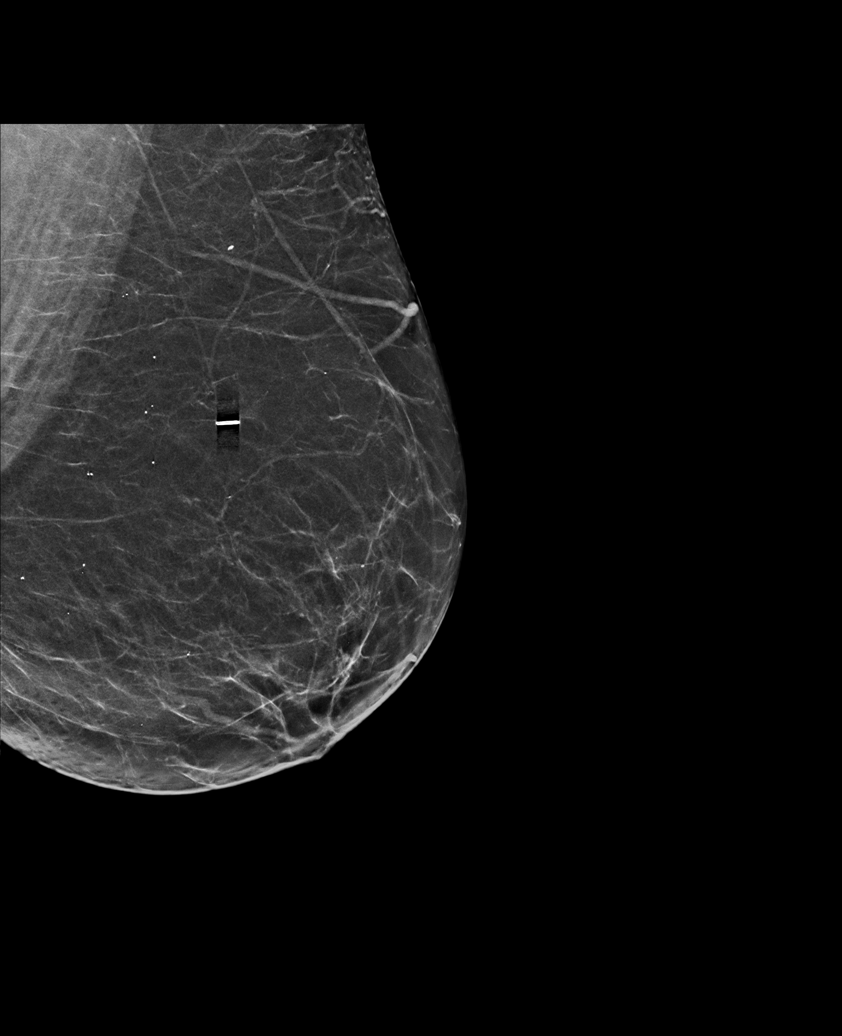

[R CC synth-2D (1 of 2)]
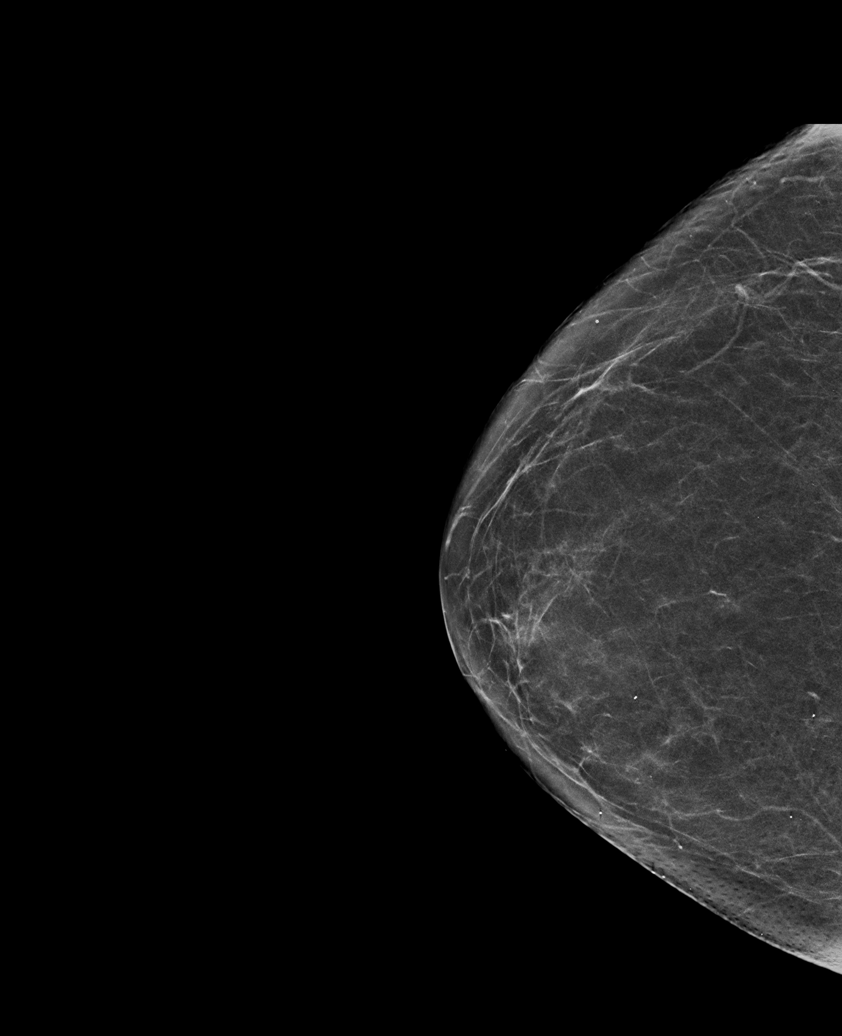

[R CC synth-2D (2 of 2)]
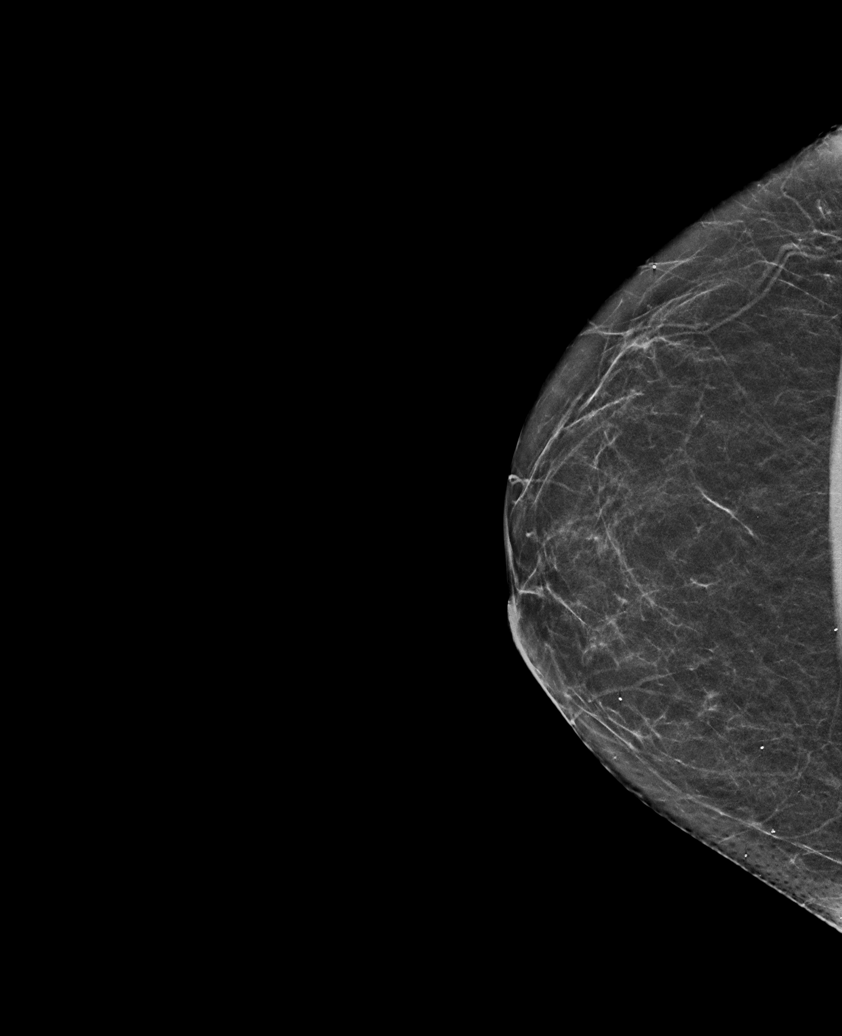

[R MLO synth-2D]
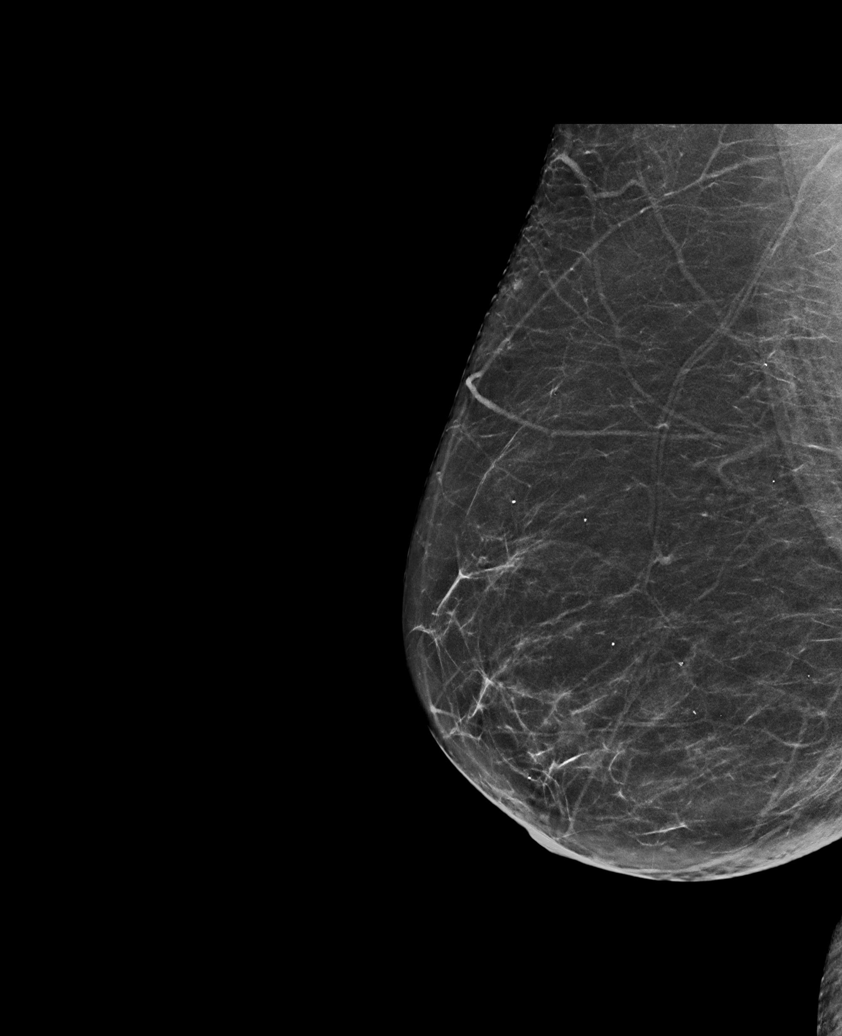

[6 of 36 positions shown; findings below may reference images not displayed]

FINDINGS: There are no findings suspicious for malignancy. Images were
processed with CAD.
IMPRESSION: No mammographic evidence of malignancy. A result letter of this
screening mammogram will be mailed directly to the patient.

RECOMMENDATION:
Screening mammogram in one year. (Code:[id number])

BI-RADS CATEGORY  1: Negative.

## 2019-10-12 DIAGNOSIS — E119 Type 2 diabetes mellitus without complications: Secondary | ICD-10-CM | POA: Diagnosis not present

## 2019-10-12 DIAGNOSIS — H353131 Nonexudative age-related macular degeneration, bilateral, early dry stage: Secondary | ICD-10-CM | POA: Diagnosis not present

## 2019-10-12 DIAGNOSIS — H25813 Combined forms of age-related cataract, bilateral: Secondary | ICD-10-CM | POA: Diagnosis not present

## 2019-10-19 DIAGNOSIS — R05 Cough: Secondary | ICD-10-CM | POA: Diagnosis not present

## 2019-10-19 DIAGNOSIS — G4089 Other seizures: Secondary | ICD-10-CM | POA: Diagnosis not present

## 2019-10-19 DIAGNOSIS — I10 Essential (primary) hypertension: Secondary | ICD-10-CM | POA: Diagnosis not present

## 2019-10-19 DIAGNOSIS — E1169 Type 2 diabetes mellitus with other specified complication: Secondary | ICD-10-CM | POA: Diagnosis not present

## 2019-10-19 DIAGNOSIS — E785 Hyperlipidemia, unspecified: Secondary | ICD-10-CM | POA: Diagnosis not present

## 2019-10-19 DIAGNOSIS — G629 Polyneuropathy, unspecified: Secondary | ICD-10-CM | POA: Diagnosis not present

## 2019-10-19 DIAGNOSIS — J069 Acute upper respiratory infection, unspecified: Secondary | ICD-10-CM | POA: Diagnosis not present

## 2019-10-19 DIAGNOSIS — J441 Chronic obstructive pulmonary disease with (acute) exacerbation: Secondary | ICD-10-CM | POA: Diagnosis not present

## 2019-10-19 DIAGNOSIS — Z6835 Body mass index (BMI) 35.0-35.9, adult: Secondary | ICD-10-CM | POA: Diagnosis not present

## 2019-10-19 DIAGNOSIS — D509 Iron deficiency anemia, unspecified: Secondary | ICD-10-CM | POA: Diagnosis not present

## 2019-10-19 DIAGNOSIS — Z8673 Personal history of transient ischemic attack (TIA), and cerebral infarction without residual deficits: Secondary | ICD-10-CM | POA: Diagnosis not present

## 2019-10-19 DIAGNOSIS — Z0000 Encounter for general adult medical examination without abnormal findings: Secondary | ICD-10-CM | POA: Diagnosis not present

## 2019-10-19 DIAGNOSIS — B373 Candidiasis of vulva and vagina: Secondary | ICD-10-CM | POA: Diagnosis not present

## 2019-11-08 DIAGNOSIS — I10 Essential (primary) hypertension: Secondary | ICD-10-CM | POA: Diagnosis not present

## 2019-11-08 DIAGNOSIS — E782 Mixed hyperlipidemia: Secondary | ICD-10-CM | POA: Diagnosis not present

## 2019-11-08 DIAGNOSIS — E1169 Type 2 diabetes mellitus with other specified complication: Secondary | ICD-10-CM | POA: Diagnosis not present

## 2019-11-08 DIAGNOSIS — Z8673 Personal history of transient ischemic attack (TIA), and cerebral infarction without residual deficits: Secondary | ICD-10-CM | POA: Diagnosis not present

## 2019-11-15 DIAGNOSIS — R69 Illness, unspecified: Secondary | ICD-10-CM | POA: Diagnosis not present

## 2019-11-24 DIAGNOSIS — H25812 Combined forms of age-related cataract, left eye: Secondary | ICD-10-CM | POA: Diagnosis not present

## 2019-12-05 DIAGNOSIS — Z0000 Encounter for general adult medical examination without abnormal findings: Secondary | ICD-10-CM | POA: Diagnosis not present

## 2019-12-05 DIAGNOSIS — E1169 Type 2 diabetes mellitus with other specified complication: Secondary | ICD-10-CM | POA: Diagnosis not present

## 2019-12-05 DIAGNOSIS — D509 Iron deficiency anemia, unspecified: Secondary | ICD-10-CM | POA: Diagnosis not present

## 2019-12-05 DIAGNOSIS — J441 Chronic obstructive pulmonary disease with (acute) exacerbation: Secondary | ICD-10-CM | POA: Diagnosis not present

## 2019-12-05 DIAGNOSIS — R0782 Intercostal pain: Secondary | ICD-10-CM | POA: Diagnosis not present

## 2019-12-05 DIAGNOSIS — G4089 Other seizures: Secondary | ICD-10-CM | POA: Diagnosis not present

## 2019-12-05 DIAGNOSIS — Z6835 Body mass index (BMI) 35.0-35.9, adult: Secondary | ICD-10-CM | POA: Diagnosis not present

## 2019-12-05 DIAGNOSIS — J069 Acute upper respiratory infection, unspecified: Secondary | ICD-10-CM | POA: Diagnosis not present

## 2019-12-05 DIAGNOSIS — G629 Polyneuropathy, unspecified: Secondary | ICD-10-CM | POA: Diagnosis not present

## 2019-12-05 DIAGNOSIS — Z8673 Personal history of transient ischemic attack (TIA), and cerebral infarction without residual deficits: Secondary | ICD-10-CM | POA: Diagnosis not present

## 2019-12-11 ENCOUNTER — Ambulatory Visit: Payer: Medicare HMO | Admitting: Family Medicine

## 2019-12-11 DIAGNOSIS — E1169 Type 2 diabetes mellitus with other specified complication: Secondary | ICD-10-CM | POA: Diagnosis not present

## 2019-12-11 DIAGNOSIS — Z8673 Personal history of transient ischemic attack (TIA), and cerebral infarction without residual deficits: Secondary | ICD-10-CM | POA: Diagnosis not present

## 2019-12-11 DIAGNOSIS — I480 Paroxysmal atrial fibrillation: Secondary | ICD-10-CM | POA: Diagnosis not present

## 2019-12-11 DIAGNOSIS — N3281 Overactive bladder: Secondary | ICD-10-CM | POA: Diagnosis not present

## 2019-12-11 DIAGNOSIS — I10 Essential (primary) hypertension: Secondary | ICD-10-CM | POA: Diagnosis not present

## 2019-12-11 DIAGNOSIS — E875 Hyperkalemia: Secondary | ICD-10-CM | POA: Diagnosis not present

## 2019-12-11 DIAGNOSIS — G4089 Other seizures: Secondary | ICD-10-CM | POA: Diagnosis not present

## 2019-12-11 DIAGNOSIS — Z0001 Encounter for general adult medical examination with abnormal findings: Secondary | ICD-10-CM | POA: Diagnosis not present

## 2019-12-11 DIAGNOSIS — Z23 Encounter for immunization: Secondary | ICD-10-CM | POA: Diagnosis not present

## 2019-12-11 DIAGNOSIS — E782 Mixed hyperlipidemia: Secondary | ICD-10-CM | POA: Diagnosis not present

## 2019-12-11 DIAGNOSIS — L989 Disorder of the skin and subcutaneous tissue, unspecified: Secondary | ICD-10-CM | POA: Diagnosis not present

## 2019-12-15 IMAGING — US US CAROTID DUPLEX BILAT
1 series · 13 of 24 positions shown · non-contrast
Comparison: None.

CLINICAL DATA: Seizure

EXAM:
BILATERAL CAROTID DUPLEX ULTRASOUND
TECHNIQUE: Gray scale imaging, color Doppler and duplex ultrasound were
performed of bilateral carotid and vertebral arteries in the neck.

[Series 1: us carotid duplex bilat · 13 of 69 slices shown]
[im 1/69]
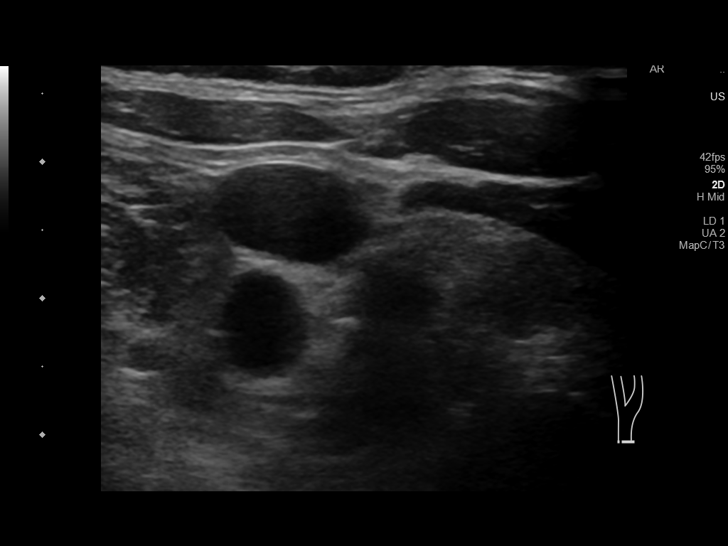
[im 6/69]
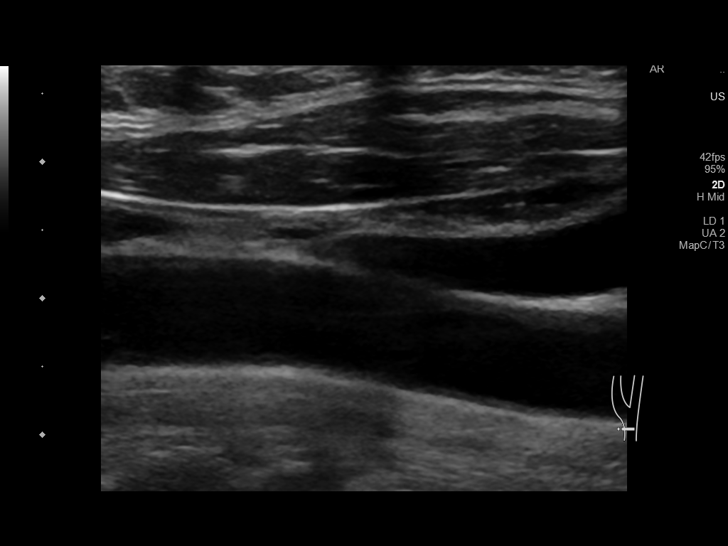
[im 12/69]
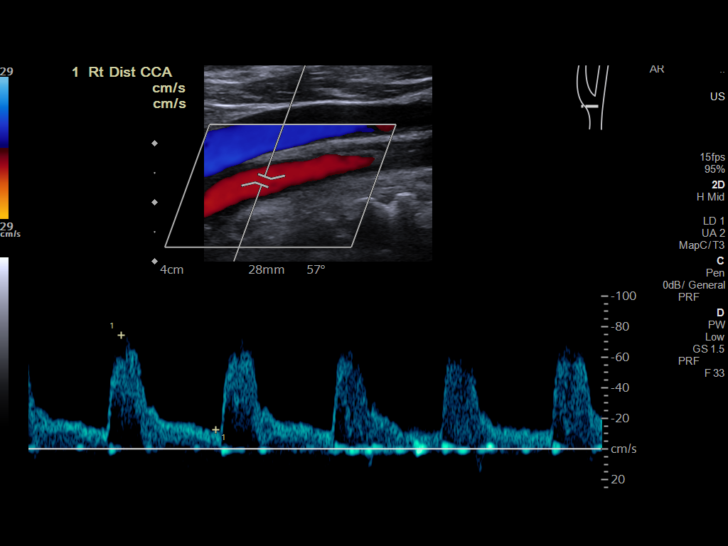
[im 18/69]
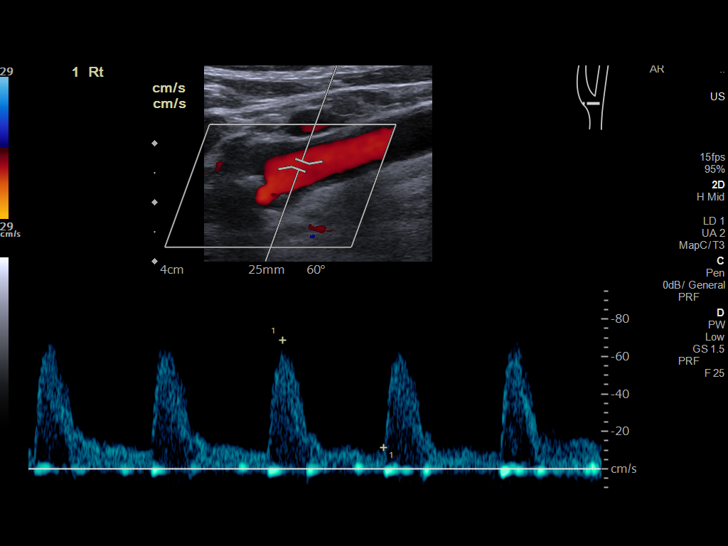
[im 24/69]
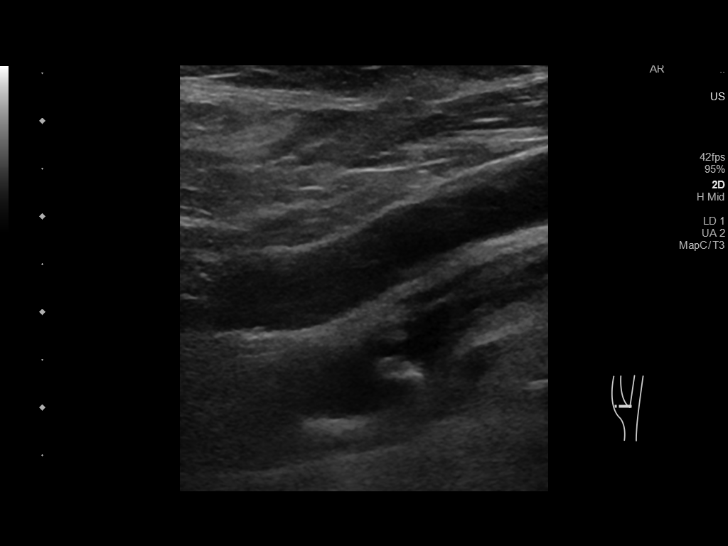
[im 30/69]
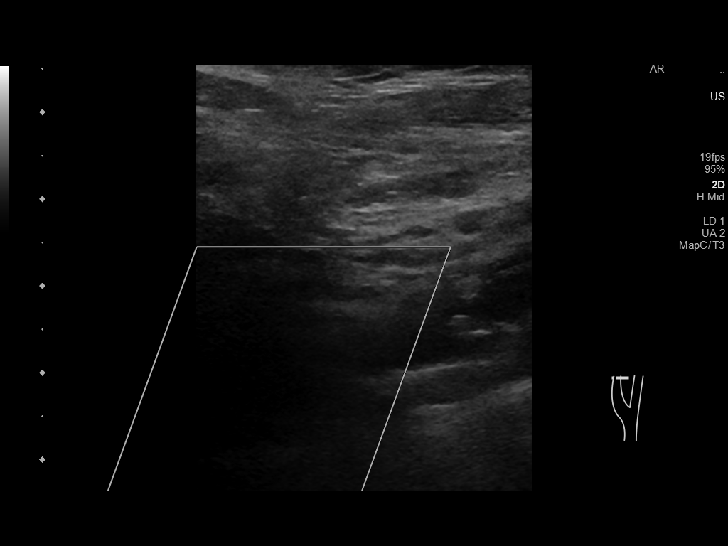
[im 36/69]
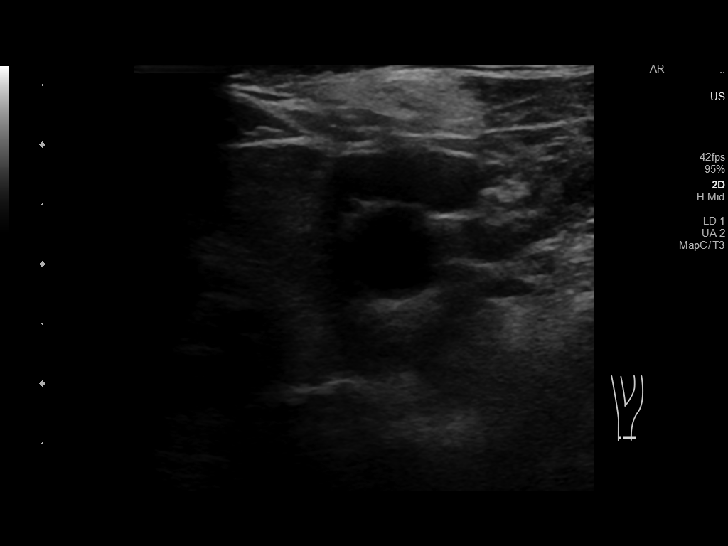
[im 39/69]
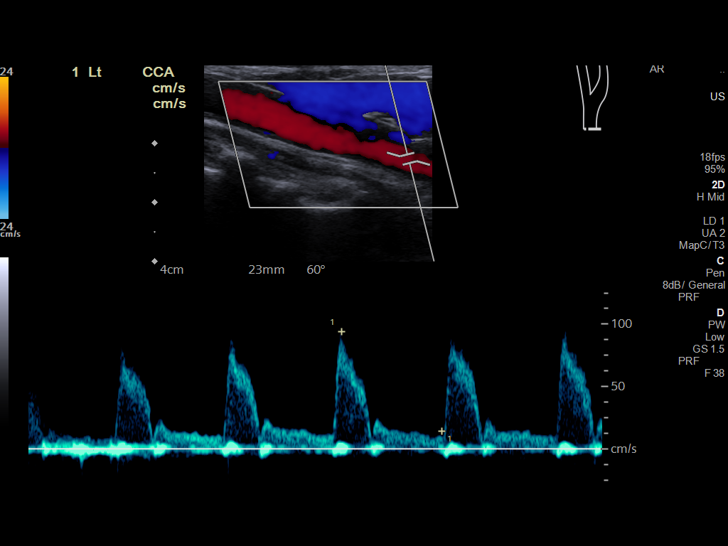
[im 45/69]
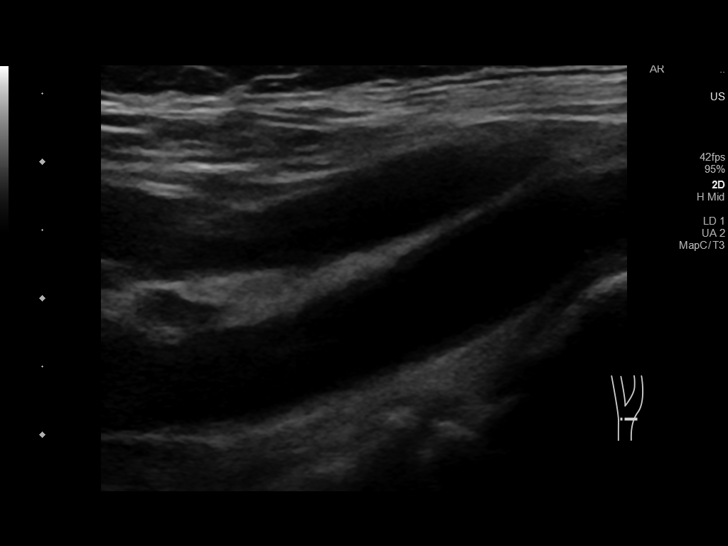
[im 51/69]
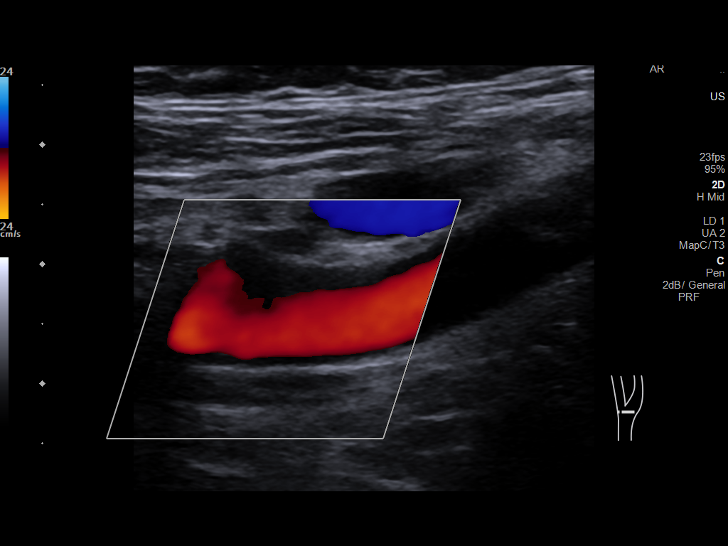
[im 57/69]
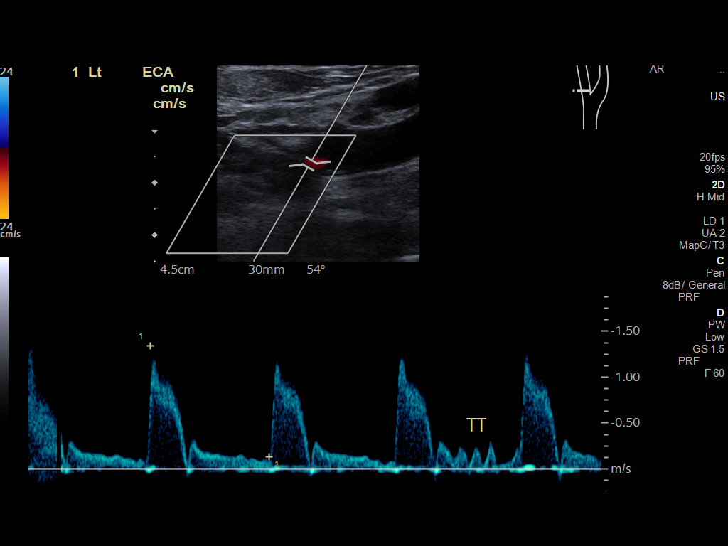
[im 63/69]
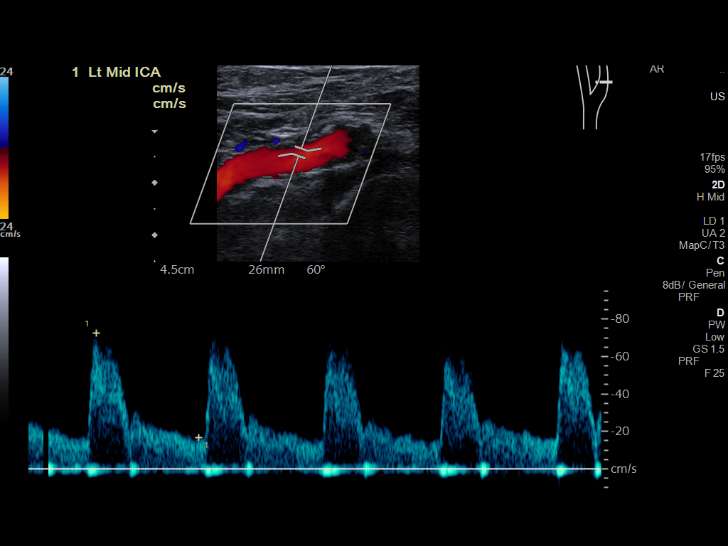
[im 69/69]
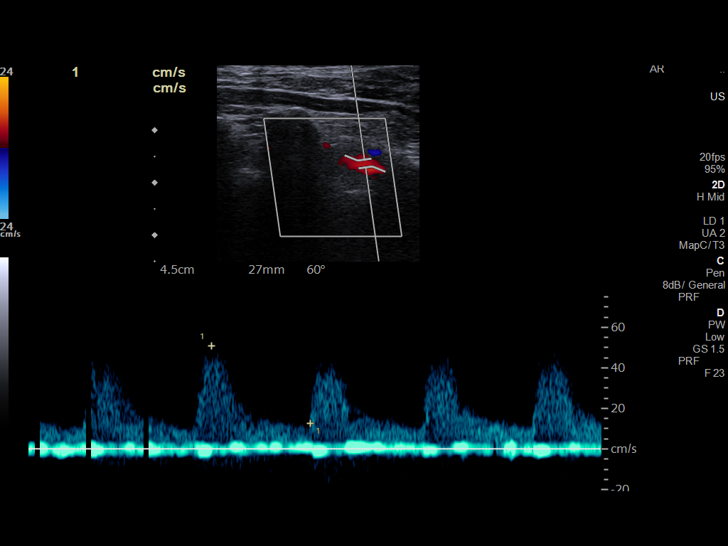

[13 of 24 positions shown; findings below may reference images not displayed]

FINDINGS: Criteria: Quantification of carotid stenosis is based on velocity
parameters that correlate the residual internal carotid diameter
with NASCET-based stenosis levels, using the diameter of the distal
internal carotid lumen as the denominator for stenosis measurement.

The following velocity measurements were obtained:

RIGHT

ICA: 111 cm/sec

CCA: 79 cm/sec

SYSTOLIC ICA/CCA RATIO:

ECA: 111 cm/sec

LEFT

ICA: 91 cm/sec

CCA: 80 cm/sec

SYSTOLIC ICA/CCA RATIO:

ECA: 134 cm/sec

RIGHT CAROTID ARTERY: Moderate smooth circumferential predominately
soft plaque in the bulb. Low resistance internal carotid Doppler
pattern is preserved.

RIGHT VERTEBRAL ARTERY:  Antegrade.

LEFT CAROTID ARTERY: Moderate circumferential smooth mixed plaque in
the bulb. Low resistance internal carotid Doppler pattern is
preserved.

LEFT VERTEBRAL ARTERY:  Antegrade.
IMPRESSION: Less than 50% stenosis in the right and left internal carotid
arteries.

## 2019-12-20 DIAGNOSIS — H2511 Age-related nuclear cataract, right eye: Secondary | ICD-10-CM | POA: Diagnosis not present

## 2019-12-21 DIAGNOSIS — L110 Acquired keratosis follicularis: Secondary | ICD-10-CM | POA: Diagnosis not present

## 2019-12-21 DIAGNOSIS — M79672 Pain in left foot: Secondary | ICD-10-CM | POA: Diagnosis not present

## 2019-12-21 DIAGNOSIS — M79671 Pain in right foot: Secondary | ICD-10-CM | POA: Diagnosis not present

## 2019-12-21 DIAGNOSIS — E1140 Type 2 diabetes mellitus with diabetic neuropathy, unspecified: Secondary | ICD-10-CM | POA: Diagnosis not present

## 2019-12-22 DIAGNOSIS — H25811 Combined forms of age-related cataract, right eye: Secondary | ICD-10-CM | POA: Diagnosis not present

## 2020-01-01 DIAGNOSIS — Z8673 Personal history of transient ischemic attack (TIA), and cerebral infarction without residual deficits: Secondary | ICD-10-CM | POA: Diagnosis not present

## 2020-01-01 DIAGNOSIS — E782 Mixed hyperlipidemia: Secondary | ICD-10-CM | POA: Diagnosis not present

## 2020-01-01 DIAGNOSIS — E1169 Type 2 diabetes mellitus with other specified complication: Secondary | ICD-10-CM | POA: Diagnosis not present

## 2020-01-01 DIAGNOSIS — I10 Essential (primary) hypertension: Secondary | ICD-10-CM | POA: Diagnosis not present

## 2020-01-03 ENCOUNTER — Other Ambulatory Visit: Payer: Self-pay

## 2020-01-03 ENCOUNTER — Encounter: Payer: Self-pay | Admitting: Family Medicine

## 2020-01-03 ENCOUNTER — Ambulatory Visit: Payer: Medicare HMO | Admitting: Family Medicine

## 2020-01-03 VITALS — BP 179/77 | HR 73 | Ht 68.0 in | Wt 252.0 lb

## 2020-01-03 DIAGNOSIS — G40909 Epilepsy, unspecified, not intractable, without status epilepticus: Secondary | ICD-10-CM | POA: Diagnosis not present

## 2020-01-03 NOTE — Progress Notes (Addendum)
Chief Complaint  Patient presents with  . Follow-up  . Seizures    pt had  seizure yesterday and blacked out. Pt said she had a voiding accident during the seizure.     HISTORY OF PRESENT ILLNESS: Today 01/03/20  [place] is a 74 y.o. female here today for follow up for seizures. She continues levetiracetam 1500mg  BID. She reports that she has had three events since last being seen. (3/1, 8/1 and 11/2) Events are always late at night or early morning hours. Per her husband she will have a change in breathing "ragged breathing" then she gets stiff (whole body) and then she will go limp. She is unable to communicate during and seems a little confused and tired following. She reports that she has only had 1 episode of urine incontinence and reports this occurred 11/2. Event usually 45 seconds or less. No tongue injured. No missed doses of AED. Last dose this morning. No obvious triggers. She does admits to be anxious today. BP is usually well managed. She reports having difficulty with depression due to family relationship concerns with her daughter and her husband. She was seen by PCP on 10/11 with good report. She did not mention seizure events to PCP. She has not called [place] to report events.    HISTORY (copied from Dr Gladstone Lighter note on 04/10/2019)  UPDATE (04/10/19, VRP): Since last visit, had sz on Nov 3, Dec 9, Dec 30. Now on LEV 1500mg  twice a day since Nov 2020. No alleviating or aggravating factors. Tolerating LEV 1500mg  twice a day.    UPDATE (11/30/18, VRP): Since last visit, was doing well until 10/30/18 --> felt cold at home, then had shaking and confusion at home for few minutes, then 2-3 hours of confusion, went to ER, and dx'd with sepsis / UTI and treated. Since then had 2 more sz events on 11/23/18 and 11/25/18 (few min of shaking arms, eyes open, no post-ictal confusion; no tongue biting; no incontinence).   UPDATE (10/04/18, VRP): Since last visit, doing here for  evaluation of numbness and tingling in the feet.  Patient has had this problem for past 3 years.  Patient has diabetes which is under control with medication.  She also has some low back pain without radiating symptoms.  Here for evaluation of neuropathy versus lumbar radiculopathy.  PRIOR HPI (07/06/18): 74 year old female with hypertension, diabetes, hypercholesterolemia, here for evaluation of seizure.  December 2019 patient had episode of possibly back, color change in the face, shaking and convulsions.  No tongue biting or incontinence.  Episode lasted for 30 to 60 seconds.  Patient had evaluated by neurologist who ordered EEG and MRI.  Currently EEG was unremarkable.  MRI of the brain showed moderate to severe chronic small vessel ischemic disease and moderate atrophy.  Possibility of seizure versus TIA was raised.  Patient did well until April 2020 when she had her second event, similar seizure-like activity.  This time patient was somewhat aware during the event, felt herself going into the shaking.  He was somewhat confused.  Patient had a 3rd event May 2020, also a similar type.  No prior history of seizures.  Patient did have history of head trauma in 2005 requiring multiple sutures in the scalp.    REVIEW OF SYSTEMS: Out of a complete 14 system review of symptoms, the patient complains only of the following symptoms, seizure-like events and all other reviewed systems are negative.   ALLERGIES: Allergies  Allergen Reactions  .  Penicillins Other (See Comments)    Vomiting, ge 10 Can take cephal Did it involve swelling of the face/tongue/throat, SOB, or low BP? No Did it involve sudden or severe rash/hives, skin peeling, or any reaction on the inside of your mouth or nose? No Did you need to seek medical attention at a hospital or doctor's office? No When did it last happen? If all above answers are "NO", may proceed with cephalosporin use.   . Prinivil [Lisinopril] Cough       HOME MEDICATIONS: Outpatient Medications Prior to Visit  Medication Sig Dispense Refill  . acetaminophen (TYLENOL) 325 MG tablet Take 2 tablets (650 mg total) by mouth every 6 (six) hours as needed for mild pain, fever or headache (or Fever >/= 101). 12 tablet 0  . aspirin EC 81 MG tablet Take 1 tablet (81 mg total) by mouth daily with breakfast. 30 tablet 2  . calcium carbonate (OS-CAL) 600 MG TABS Take 600 mg by mouth 2 (two) times daily before a meal. With Vitamin D     . Ferrous Sulfate (IRON PO) Take 65 mg by mouth daily.    . fish oil-omega-3 fatty acids 1000 MG capsule Take 1 g by mouth daily.      . Insulin Degludec (TRESIBA) 100 UNIT/ML SOLN Inject 42 Units into the skin. Takes 42 units    . levETIRAcetam (KEPPRA) 750 MG tablet Take 2 tablets (1,500 mg total) by mouth 2 (two) times daily. (Patient taking differently: Take 1,500 mg by mouth 2 (two) times daily. Current taking three 500mg  tablets twice daily due to pharmacy change.) 360 tablet 4  . metFORMIN (GLUCOPHAGE) 500 MG tablet 1 TABLET BY MOUTH TWICE DAILY WITH MEALS (Patient taking differently: Takes 2 TABLET BY MOUTH TWICE DAILY WITH MEALS) 60 tablet 5  . Multiple Vitamins-Minerals (CENTRUM SILVER PO) Take by mouth.    . pioglitazone (ACTOS) 30 MG tablet Take 30 mg by mouth daily.    . rosuvastatin (CRESTOR) 10 MG tablet Take 20 mg by mouth daily.      No facility-administered medications prior to visit.     PAST MEDICAL HISTORY: Past Medical History:  Diagnosis Date  . Anemia   . Arthritis   . Asthma    mild  . Cough   . Diabetes mellitus type 2, uncomplicated (HCC)    x 10 yrs  . Diabetic neuropathy ([place]) rt foot  . Headache(784.0)   . Hypercholesteremia   . Hyperlipidemia   . Hypertension   . Hypertriglyceridemia 2002  . IBS (irritable bowel syndrome)   . Neuropathy   . Seizures ([place])    last sz 01/03/2019     PAST SURGICAL HISTORY: Past Surgical History:  Procedure Laterality Date  .  APPENDECTOMY    . BREAST SURGERY  1999   left breast biopsy  . CERVICAL SPINE SURGERY  december 2003  . [place]   for unknown reason.  They thought she had a mass.  . COLONOSCOPY  12/04/2010   Procedure: COLONOSCOPY;  Surgeon: Rogene Houston, MD;  [facility];  Service: Endoscopy;  Laterality: N/A;  3:00  . COLONOSCOPY N/A 01/18/2015   Procedure: COLONOSCOPY;  Surgeon: Rogene Houston, MD;  Location: [facility];  Service: Endoscopy;  Laterality: N/A;  955 - moved to 10:40 - Ann notified pt     FAMILY HISTORY: Family History  Adopted: Yes     SOCIAL HISTORY: Social History   Socioeconomic History  . Marital  status: Married    Spouse name: [name]  . Number of children: Not on file  . Years of education: Not on file  . Highest education level: High school graduate  Occupational History    Comment: retired  Tobacco Use  . Smoking status: Former Smoker    Packs/day: 3.00    Years: 20.00    Pack years: 60.00  . Smokeless tobacco: Never Used  . Tobacco comment: Quit smoking in early 80s after smoking 20 yrs,.  Substance and Sexual Activity  . Alcohol use: Yes    Alcohol/week: 0.0 standard drinks    Comment: very rare occasion on a holiday  . Drug use: No  . Sexual activity: Not Currently  Other Topics Concern  . Not on file  Social History Narrative   Lives with spouse   Caffeine- coffee 4-5 day   Social Determinants of Health   Financial Resource Strain:   . Difficulty of Paying Living Expenses: Not on file  Food Insecurity:   . Worried About Charity fundraiser in the Last Year: Not on file  . Ran Out of Food in the Last Year: Not on file  Transportation Needs:   . Lack of Transportation (Medical): Not on file  . Lack of Transportation (Non-Medical): Not on file  Physical Activity:   . Days of Exercise per Week: Not on file  . Minutes of Exercise per Session: Not on file  Stress:   . Feeling of Stress : Not on file  Social  Connections:   . Frequency of Communication with Friends and Family: Not on file  . Frequency of Social Gatherings with Friends and Family: Not on file  . Attends Religious Services: Not on file  . Active Member of Clubs or Organizations: Not on file  . Attends Archivist Meetings: Not on file  . Marital Status: Not on file  Intimate Partner Violence:   . Fear of Current or Ex-Partner: Not on file  . Emotionally Abused: Not on file  . Physically Abused: Not on file  . Sexually Abused: Not on file      PHYSICAL EXAM  Vitals:   01/03/20 1040  BP: (!) 179/77  Pulse: 73  Weight: 252 lb (114.3 kg)  Height: 5\' 8"  (1.727 m)   Body mass index is 38.32 kg/m.   Generalized: Well developed, in no acute distress   Neurological examination  Mentation: Alert oriented to time, place, history taking. Follows all commands speech and language fluent Cranial nerve II-XII: Pupils were equal round reactive to light. Extraocular movements were full, visual field were full on confrontational test. Facial sensation and strength were normal. Head turning and shoulder shrug  were normal and symmetric. Motor: The motor testing reveals 5 over 5 strength of all 4 extremities. Good symmetric motor tone is noted throughout.  Sensory: Sensory testing is intact to soft touch on all 4 extremities. No evidence of extinction is noted.  Coordination: Cerebellar testing reveals good finger-nose-finger and heel-to-shin bilaterally.  Gait and station: Gait is normal.  Reflexes: Deep tendon reflexes are symmetric and normal bilaterally.     DIAGNOSTIC DATA (LABS, IMAGING, TESTING) - I reviewed patient records, labs, notes, testing and imaging myself where available.  Lab Results  Component Value Date   WBC 11.0 (H) 01/04/2019   HGB 13.5 01/04/2019   HCT 43.8 01/04/2019   MCV 91.3 01/04/2019   PLT 354 01/04/2019      Component Value Date/Time   NA 139 01/04/2019  1459   NA 137 01/06/2016 0814     K 4.2 01/04/2019 1459   CL 102 01/04/2019 1459   CO2 27 01/04/2019 1459   GLUCOSE 66 (L) 01/04/2019 1459   BUN 15 01/04/2019 1459   BUN 12 01/06/2016 0814   CREATININE 0.79 01/04/2019 1459   CREATININE 0.80 01/17/2014 1001   CALCIUM 9.6 01/04/2019 1459   PROT 7.0 10/30/2018 0558   PROT 6.5 01/06/2016 0814   ALBUMIN 3.5 10/30/2018 0558   ALBUMIN 4.1 01/06/2016 0814   AST 48 (H) 10/30/2018 0558   ALT 32 10/30/2018 0558   ALKPHOS 63 10/30/2018 0558   BILITOT 0.5 10/30/2018 0558   BILITOT 0.3 01/06/2016 0814   GFRNONAA >60 01/04/2019 1459   GFRAA >60 01/04/2019 1459   Lab Results  Component Value Date   CHOL 231 (H) 01/06/2016   HDL 53 01/06/2016   LDLCALC 108 (H) 01/06/2016   TRIG 352 (H) 01/06/2016   CHOLHDL 4.4 01/06/2016   Lab Results  Component Value Date   HGBA1C 8.1 01/01/2016   No results found for: VITAMINB12 No results found for: TSH    ASSESSMENT AND PLAN  74 y.o. year old female  has a past medical history of Anemia, Arthritis, Asthma, Cough, Diabetes mellitus type 2, uncomplicated ([place]), Diabetic neuropathy ([place]) (rt foot), Headache(784.0), Hypercholesteremia, Hyperlipidemia, Hypertension, Hypertriglyceridemia (2002), IBS (irritable bowel syndrome), Neuropathy, and Seizures ([place]). here with   Seizure disorder [facility]) - Plan: CBC with Differential/Platelets, CMP, Levetiracetam level  Patient reports that she has had 3 events since last being seen by Dr. Leta Baptist in February, 2021.  Events consist of sudden stiffness of her entire body followed by being completely limp.  She is unable to communicate but states that occasionally she does remember the events.  It is unclear if these are epileptic events.  Work-up has been unremarkable.  She denies missed doses of levetiracetam and currently taking 1500 mg twice daily.  I will update lab work today.  We will discuss prolonged EEG pending lab results.  May consider second opinion with epileptologist.  She verbalizes  understanding and agreement with this plan.  She will not drive until 6 months seizure-free.  Seizure precautions reviewed.  We will schedule follow-up pending labs.  Addendum 01/04/2020: Per Dr. Leta Baptist recommendation we will add Vimpat 50 mg twice daily.  We will order video EEG with [facility].  Patient notified of medications called to pharmacy.  I spent 20 minutes of face-to-face and non-face-to-face time with patient.  This included previsit chart review, lab review, study review, order entry, electronic health record documentation, patient education.    Debbora Presto, MSN, FNP-C 01/03/2020, 3:58 PM  [facility] [address] [place], [place] [address] [phone]

## 2020-01-03 NOTE — Progress Notes (Signed)
I reviewed note and agree with plan.   Add vimpat 50mg  twice a day. Consider video EEG monitoring to better characterize spells ([facility] vs [facility]).   Penni Bombard, MD 61/05/4313, 4:00 PM Certified in Neurology, Neurophysiology and Neuroimaging  [facility] [address] [place], [place] [address] [phone]

## 2020-01-03 NOTE — Patient Instructions (Addendum)
Below is our plan:  We will continue levetiracetam 1500mg  twice daily. We will update labs today. I will discuss next steps in treatment plan with Dr Leta Baptist based on lab results. Please continue to monitor symptoms closely and call me with any new or worsening events. Please do not drive until seizure free for 6 months. Please continue close follow up with PCP regarding BP and depression.   Please make sure you are staying well hydrated. I recommend 50-60 ounces daily. Well balanced diet and regular exercise encouraged.    Please continue follow up with care team as directed.   Follow up pending labs and discussion with Dr Leta Baptist.  You may receive a survey regarding today's visit. I encourage you to leave honest feed back as I do use this information to improve patient care. Thank you for seeing me today!      According to [place] law, you can not drive unless you are seizure / syncope free for at least 6 months and under physician's care.  Please maintain precautions. Do not participate in activities where a loss of awareness could harm you or someone else. No swimming alone, no tub bathing, no hot tubs, no driving, no operating motorized vehicles (cars, ATVs, motocycles, etc), lawnmowers, power tools or firearms. No standing at heights, such as rooftops, ladders or stairs. Avoid hot objects such as stoves, heaters, open fires. Wear a helmet when riding a bicycle, scooter, skateboard, etc. and avoid areas of traffic. Set your water heater to 120 degrees or less.    Seizure, Adult A seizure is a sudden burst of abnormal electrical activity in the brain. Seizures usually last from 30 seconds to 2 minutes. They can cause many different symptoms. Usually, seizures are not harmful unless they last a long time. What are the causes? Common causes of this condition include:  Fever or infection.  Conditions that affect the brain, such as: ? A brain abnormality that you were born with. ? A  brain or head injury. ? Bleeding in the brain. ? A tumor. ? Stroke. ? Brain disorders such as autism or cerebral palsy.  Low blood sugar.  Conditions that are passed from parent to child (are inherited).  Problems with substances, such as: ? Having a reaction to a drug or a medicine. ? Suddenly stopping the use of a substance (withdrawal). In some cases, the cause may not be known. A person who has repeated seizures over time without a clear cause has a condition called epilepsy. What increases the risk? You are more likely to get this condition if you have:  A family history of epilepsy.  Had a seizure in the past.  A brain disorder.  A history of head injury, lack of oxygen at birth, or strokes. What are the signs or symptoms? There are many types of seizures. The symptoms vary depending on the type of seizure you have. Examples of symptoms during a seizure include:  Shaking (convulsions).  Stiffness in the body.  Passing out (losing consciousness).  Head nodding.  Staring.  Not responding to sound or touch.  Loss of bladder control and bowel control. Some people have symptoms right before and right after a seizure happens. Symptoms before a seizure may include:  Fear.  Worry (anxiety).  Feeling like you may vomit (nauseous).  Feeling like the room is spinning (vertigo).  Feeling like you saw or heard something before (dj vu).  Odd tastes or smells.  Changes in how you see. You may see  flashing lights or spots. Symptoms after a seizure happens can include:  Confusion.  Sleepiness.  Headache.  Weakness on one side of the body. How is this treated? Most seizures will stop on their own in under 5 minutes. In these cases, no treatment is needed. Seizures that last longer than 5 minutes will usually need treatment. Treatment can include:  Medicines given through an IV tube.  Avoiding things that are known to cause your seizures. These can include  medicines that you take for another condition.  Medicines to treat epilepsy.  Surgery to stop the seizures. This may be needed if medicines do not help. Follow these instructions at home: Medicines  Take over-the-counter and prescription medicines only as told by your doctor.  Do not eat or drink anything that may keep your medicine from working, such as alcohol. Activity  Do not do any activities that would be dangerous if you had another seizure, like driving or swimming. Wait until your doctor says it is safe for you to do them.  If you live in the [place] ask your local DMV (department of motor vehicles) when you can drive.  Get plenty of rest. Teaching others Teach friends and family what to do when you have a seizure. They should:  Lay you on the ground.  Protect your head and body.  Loosen any tight clothing around your neck.  Turn you on your side.  Not hold you down.  Not put anything into your mouth.  Know whether or not you need emergency care.  Stay with you until you are better.  General instructions  Contact your doctor each time you have a seizure.  Avoid anything that gives you seizures.  Keep a seizure diary. Write down: ? What you think caused each seizure. ? What you remember about each seizure.  Keep all follow-up visits as told by your doctor. This is important. Contact a doctor if:  You have another seizure.  You have seizures more often.  There is any change in what happens during your seizures.  You keep having seizures with treatment.  You have symptoms of being sick or having an infection. Get help right away if:  You have a seizure that: ? Lasts longer than 5 minutes. ? Is different than seizures you had before. ? Makes it harder to breathe. ? Happens after you hurt your head.  You have any of these symptoms after a seizure: ? Not being able to speak. ? Not being able to use a part of your body. ? Confusion. ? A bad  headache.  You have two or more seizures in a row.  You do not wake up right after a seizure.  You get hurt during a seizure. These symptoms may be an emergency. Do not wait to see if the symptoms will go away. Get medical help right away. Call your local emergency services (911 in the [place] Do not drive yourself to the hospital. Summary  Seizures usually last from 30 seconds to 2 minutes. Usually, they are not harmful unless they last a long time.  Do not eat or drink anything that may keep your medicine from working, such as alcohol.  Teach friends and family what to do when you have a seizure.  Contact your doctor each time you have a seizure. This information is not intended to replace advice given to you by your health care provider. Make sure you discuss any questions you have with your health care provider. Document Revised:  05/06/2018 Document Reviewed: 05/06/2018 Elsevier Patient Education  [place].

## 2020-01-04 ENCOUNTER — Telehealth: Payer: Self-pay | Admitting: *Deleted

## 2020-01-04 ENCOUNTER — Telehealth: Payer: Self-pay | Admitting: Family Medicine

## 2020-01-04 MED ORDER — LACOSAMIDE 50 MG PO TABS: 50.0000 mg | ORAL_TABLET | Freq: Two times a day (BID) | ORAL | 11 refills | Status: DC

## 2020-01-04 NOTE — Telephone Encounter (Signed)
Please see lab result note.  I am still waiting on levetiracetam levels.  Per Dr. Gladstone Lighter recommendation we will add Vimpat 50 mg twice daily.  We have also ordered a video EEG that we will get set up for her.  Seizure precautions.

## 2020-01-04 NOTE — Telephone Encounter (Signed)
Called pt and she had another episode (this am 0520, no trigger that she is aware off.  Taking medications as ordered keppra 1500mg  po bid. No skipped doses.  She stated felt dizzy (room spinning), lying in bed, hed felt funny, she was saying nonono. lated for a minute.  Husband witnessed.  Labs back except keppra level. Will let amy NP know.

## 2020-01-04 NOTE — Telephone Encounter (Signed)
-----   Message from Debbora Presto, NP sent at 01/04/2020  1:16 PM EDT ----- Please let her know that lab work we have received is unremarkable.  We are still waiting on levetiracetam levels.  I will update her as soon as we have those back.  I have discussed case with Dr. Leta Baptist.  He wishes to add Vimpat 50 mg twice daily.  I have called this into the pharmacy for her.  We will also order an video EEG.  She should hear back from our office regarding referral and how to schedule.

## 2020-01-04 NOTE — Telephone Encounter (Signed)
See other note

## 2020-01-04 NOTE — Telephone Encounter (Signed)
I called pt and and relayed the lab results so far unremarkable.  keppra level still pending.  Will call with that results.  Will start vimpat 50mg  po bid ([facility]stream pharmacy).  Also do VEEG [facility] or [facility] for 24 hours.  She will get a call to set up. I relayed that [facility] has Sz precautions please review both she and her husband.  She stated they will.  She will call back as needed.

## 2020-01-04 NOTE — Addendum Note (Signed)
Addended byUbaldo Glassing, Theadore Blunck L on: 01/04/2020 01:14 PM   Modules accepted: Orders

## 2020-01-04 NOTE — Telephone Encounter (Signed)
Pt. States she had another episode this morning. She wanted to let Dr. know as they have never been this close together. Please advise.

## 2020-01-04 NOTE — Telephone Encounter (Signed)
I called pt and gave her results of labs so far per AL/NP will start vimpat 50mg  po bid (upstream).  VEEG 24 hour at [facility] or [facility]. They are to call her to set up.  epliepsy [facility] for sz precautions.  She and her husband will check out.  She verbalized understanding.  Will be taking vimpat in addition to her other medications.  She has taken tylenol for R eye pain as helped.

## 2020-01-06 LAB — CBC WITH DIFFERENTIAL/PLATELET
Basophils Absolute: 0.1 10*3/uL (ref 0.0–0.2)
Basos: 1 %
EOS (ABSOLUTE): 0.3 10*3/uL (ref 0.0–0.4)
Eos: 3 %
Hematocrit: 37.7 % (ref 34.0–46.6)
Hemoglobin: 11.7 g/dL (ref 11.1–15.9)
Immature Grans (Abs): 0 10*3/uL (ref 0.0–0.1)
Immature Granulocytes: 0 %
Lymphocytes Absolute: 3.4 10*3/uL — ABNORMAL HIGH (ref 0.7–3.1)
Lymphs: 34 %
MCH: 25.7 pg — ABNORMAL LOW (ref 26.6–33.0)
MCHC: 31 g/dL — ABNORMAL LOW (ref 31.5–35.7)
MCV: 83 fL (ref 79–97)
Monocytes Absolute: 0.7 10*3/uL (ref 0.1–0.9)
Monocytes: 7 %
Neutrophils Absolute: 5.5 10*3/uL (ref 1.4–7.0)
Neutrophils: 55 %
Platelets: 405 10*3/uL (ref 150–450)
RBC: 4.55 x10E6/uL (ref 3.77–5.28)
RDW: 16.5 % — ABNORMAL HIGH (ref 11.7–15.4)
WBC: 9.9 10*3/uL (ref 3.4–10.8)

## 2020-01-06 LAB — COMPREHENSIVE METABOLIC PANEL
ALT: 17 IU/L (ref 0–32)
AST: 25 IU/L (ref 0–40)
Albumin/Globulin Ratio: 1.6 (ref 1.2–2.2)
Albumin: 4.2 g/dL (ref 3.7–4.7)
Alkaline Phosphatase: 70 IU/L (ref 44–121)
BUN/Creatinine Ratio: 15 (ref 12–28)
BUN: 13 mg/dL (ref 8–27)
Bilirubin Total: <0.2 mg/dL (ref 0.0–1.2)
CO2: 26 mmol/L (ref 20–29)
Calcium: 9.6 mg/dL (ref 8.7–10.3)
Chloride: 102 mmol/L (ref 96–106)
Creatinine, Ser: 0.84 mg/dL (ref 0.57–1.00)
GFR calc Af Amer: 79 mL/min/{1.73_m2} (ref >59)
GFR calc non Af Amer: 68 mL/min/{1.73_m2} (ref >59)
Globulin, Total: 2.7 g/dL (ref 1.5–4.5)
Glucose: 107 mg/dL — ABNORMAL HIGH (ref 65–99)
Potassium: 4.7 mmol/L (ref 3.5–5.2)
Sodium: 142 mmol/L (ref 134–144)
Total Protein: 6.9 g/dL (ref 6.0–8.5)

## 2020-01-06 LAB — LEVETIRACETAM LEVEL: Levetiracetam Lvl: 49 ug/mL — ABNORMAL HIGH (ref 10.0–40.0)

## 2020-01-08 NOTE — Telephone Encounter (Signed)
Sent to [place] will call patient to scheduled. Patient will call patient to schedule .

## 2020-01-11 ENCOUNTER — Other Ambulatory Visit (HOSPITAL_COMMUNITY): Payer: Self-pay | Admitting: Internal Medicine

## 2020-01-11 DIAGNOSIS — Z8673 Personal history of transient ischemic attack (TIA), and cerebral infarction without residual deficits: Secondary | ICD-10-CM | POA: Diagnosis not present

## 2020-01-11 DIAGNOSIS — I10 Essential (primary) hypertension: Secondary | ICD-10-CM | POA: Diagnosis not present

## 2020-01-11 DIAGNOSIS — E782 Mixed hyperlipidemia: Secondary | ICD-10-CM | POA: Diagnosis not present

## 2020-01-11 DIAGNOSIS — Z1231 Encounter for screening mammogram for malignant neoplasm of breast: Secondary | ICD-10-CM

## 2020-01-11 DIAGNOSIS — E1169 Type 2 diabetes mellitus with other specified complication: Secondary | ICD-10-CM | POA: Diagnosis not present

## 2020-01-11 NOTE — Telephone Encounter (Signed)
Pt states she is still waiting to hear from either Amy,NP or Dr Leta Baptist re: results, please call.

## 2020-01-11 NOTE — Telephone Encounter (Signed)
Keppra level is back at 49.  Please advise.

## 2020-01-11 NOTE — Telephone Encounter (Signed)
LEV level is good. Continue current meds and plan. -VRP

## 2020-01-11 NOTE — Telephone Encounter (Signed)
I spoke to Patient a few days a go she is aware that [facility] will call her for apt . I have talked to [facility] Video EEG.

## 2020-01-11 NOTE — Telephone Encounter (Signed)
I called and spoke to pt.  keppra level is good , conitnue keppra dn vimpat as ordered. 24 hour VEEG pt would like to go to [facility].  Took order to [facility]. She will let Sandy in [facility] know by scanning information to her and they are to notify her about setting up appt.

## 2020-01-16 ENCOUNTER — Ambulatory Visit: Payer: Medicare HMO | Admitting: Family Medicine

## 2020-01-17 ENCOUNTER — Telehealth: Payer: Self-pay | Admitting: *Deleted

## 2020-01-17 NOTE — Telephone Encounter (Signed)
Received [facility] at EEG report [place] Awake and asleep states.  03-14-2018.

## 2020-01-17 NOTE — Telephone Encounter (Signed)
Sent EEG to [facility] Via E-mail .

## 2020-01-24 ENCOUNTER — Ambulatory Visit (HOSPITAL_COMMUNITY)
Admission: RE | Admit: 2020-01-24 | Discharge: 2020-01-24 | Disposition: A | Discharge status: Home / Self Care | Payer: Medicare HMO | Source: Ambulatory Visit | Attending: Internal Medicine | Admitting: Internal Medicine

## 2020-01-24 ENCOUNTER — Other Ambulatory Visit: Payer: Self-pay

## 2020-01-24 DIAGNOSIS — Z1231 Encounter for screening mammogram for malignant neoplasm of breast: Secondary | ICD-10-CM | POA: Diagnosis not present

## 2020-02-05 ENCOUNTER — Ambulatory Visit (HOSPITAL_COMMUNITY)
Admission: RE | Admit: 2020-02-05 | Discharge: 2020-02-05 | Disposition: A | Discharge status: Home / Self Care | Payer: Medicare HMO | Source: Ambulatory Visit | Attending: Internal Medicine | Admitting: Internal Medicine

## 2020-02-05 ENCOUNTER — Other Ambulatory Visit (HOSPITAL_COMMUNITY): Payer: Self-pay | Admitting: Internal Medicine

## 2020-02-05 ENCOUNTER — Other Ambulatory Visit: Payer: Self-pay

## 2020-02-05 DIAGNOSIS — R1011 Right upper quadrant pain: Secondary | ICD-10-CM | POA: Insufficient documentation

## 2020-02-05 DIAGNOSIS — R1031 Right lower quadrant pain: Secondary | ICD-10-CM | POA: Diagnosis not present

## 2020-02-05 DIAGNOSIS — R1084 Generalized abdominal pain: Secondary | ICD-10-CM | POA: Diagnosis not present

## 2020-02-05 DIAGNOSIS — K8020 Calculus of gallbladder without cholecystitis without obstruction: Secondary | ICD-10-CM | POA: Diagnosis not present

## 2020-02-13 DIAGNOSIS — R69 Illness, unspecified: Secondary | ICD-10-CM | POA: Diagnosis not present

## 2020-02-15 DIAGNOSIS — G629 Polyneuropathy, unspecified: Secondary | ICD-10-CM | POA: Diagnosis not present

## 2020-02-15 DIAGNOSIS — K8020 Calculus of gallbladder without cholecystitis without obstruction: Secondary | ICD-10-CM | POA: Diagnosis not present

## 2020-02-15 DIAGNOSIS — R1011 Right upper quadrant pain: Secondary | ICD-10-CM | POA: Diagnosis not present

## 2020-02-15 DIAGNOSIS — Z8673 Personal history of transient ischemic attack (TIA), and cerebral infarction without residual deficits: Secondary | ICD-10-CM | POA: Diagnosis not present

## 2020-02-15 DIAGNOSIS — Z0000 Encounter for general adult medical examination without abnormal findings: Secondary | ICD-10-CM | POA: Diagnosis not present

## 2020-02-15 DIAGNOSIS — R0782 Intercostal pain: Secondary | ICD-10-CM | POA: Diagnosis not present

## 2020-02-15 DIAGNOSIS — Z6835 Body mass index (BMI) 35.0-35.9, adult: Secondary | ICD-10-CM | POA: Diagnosis not present

## 2020-02-15 DIAGNOSIS — G4089 Other seizures: Secondary | ICD-10-CM | POA: Diagnosis not present

## 2020-02-15 DIAGNOSIS — J441 Chronic obstructive pulmonary disease with (acute) exacerbation: Secondary | ICD-10-CM | POA: Diagnosis not present

## 2020-02-15 DIAGNOSIS — D509 Iron deficiency anemia, unspecified: Secondary | ICD-10-CM | POA: Diagnosis not present

## 2020-02-15 DIAGNOSIS — J069 Acute upper respiratory infection, unspecified: Secondary | ICD-10-CM | POA: Diagnosis not present

## 2020-02-15 DIAGNOSIS — E1169 Type 2 diabetes mellitus with other specified complication: Secondary | ICD-10-CM | POA: Diagnosis not present

## 2020-02-27 ENCOUNTER — Encounter (INDEPENDENT_AMBULATORY_CARE_PROVIDER_SITE_OTHER): Payer: Self-pay | Admitting: *Deleted

## 2020-03-01 DIAGNOSIS — E1169 Type 2 diabetes mellitus with other specified complication: Secondary | ICD-10-CM | POA: Diagnosis not present

## 2020-03-01 DIAGNOSIS — E782 Mixed hyperlipidemia: Secondary | ICD-10-CM | POA: Diagnosis not present

## 2020-03-01 DIAGNOSIS — I10 Essential (primary) hypertension: Secondary | ICD-10-CM | POA: Diagnosis not present

## 2020-03-01 DIAGNOSIS — Z8673 Personal history of transient ischemic attack (TIA), and cerebral infarction without residual deficits: Secondary | ICD-10-CM | POA: Diagnosis not present

## 2020-03-04 NOTE — Telephone Encounter (Signed)
UP Date - EEG   From [facility]  Maria Esparza (SEE BELOW)  Patient calls and LM on v/m to cancel admission "something else came up".  I will let you know if she reschedules. **Please make changes Thanks,

## 2020-03-18 DIAGNOSIS — R093 Abnormal sputum: Secondary | ICD-10-CM | POA: Diagnosis not present

## 2020-03-18 DIAGNOSIS — R509 Fever, unspecified: Secondary | ICD-10-CM | POA: Diagnosis not present

## 2020-03-18 DIAGNOSIS — R059 Cough, unspecified: Secondary | ICD-10-CM | POA: Diagnosis not present

## 2020-03-21 DIAGNOSIS — R5383 Other fatigue: Secondary | ICD-10-CM | POA: Diagnosis not present

## 2020-03-21 DIAGNOSIS — R059 Cough, unspecified: Secondary | ICD-10-CM | POA: Diagnosis not present

## 2020-03-21 DIAGNOSIS — M791 Myalgia, unspecified site: Secondary | ICD-10-CM | POA: Diagnosis not present

## 2020-03-25 DIAGNOSIS — R059 Cough, unspecified: Secondary | ICD-10-CM | POA: Diagnosis not present

## 2020-03-30 DIAGNOSIS — E782 Mixed hyperlipidemia: Secondary | ICD-10-CM | POA: Diagnosis not present

## 2020-03-30 DIAGNOSIS — I10 Essential (primary) hypertension: Secondary | ICD-10-CM | POA: Diagnosis not present

## 2020-03-30 DIAGNOSIS — E1169 Type 2 diabetes mellitus with other specified complication: Secondary | ICD-10-CM | POA: Diagnosis not present

## 2020-03-30 DIAGNOSIS — E875 Hyperkalemia: Secondary | ICD-10-CM | POA: Diagnosis not present

## 2020-03-30 DIAGNOSIS — Z8673 Personal history of transient ischemic attack (TIA), and cerebral infarction without residual deficits: Secondary | ICD-10-CM | POA: Diagnosis not present

## 2020-04-04 ENCOUNTER — Other Ambulatory Visit: Payer: Self-pay | Admitting: Family Medicine

## 2020-04-04 ENCOUNTER — Telehealth: Payer: Self-pay | Admitting: Family Medicine

## 2020-04-04 DIAGNOSIS — G40909 Epilepsy, unspecified, not intractable, without status epilepticus: Secondary | ICD-10-CM

## 2020-04-04 MED ORDER — VIMPAT 100 MG PO TABS: 100.0000 mg | ORAL_TABLET | Freq: Two times a day (BID) | ORAL | 5 refills | Status: DC

## 2020-04-04 NOTE — Telephone Encounter (Addendum)
Spoke to pt and she had seizure 03-18-20  lasted about a minute.  She was aware that she was going to have one, laid on bed.  Husband witnessed.  Pt taking vimpat 50mg po bid, and keppra 1500mg  po bid.  No missed does.  Sleeping ok.  Has had respiratory over the last month and taking nyquil with decongestant she states every other night for the last month.  Has not been to [facility] yet due to these respiratory sx.  Please advise.

## 2020-04-04 NOTE — Telephone Encounter (Signed)
I called pt and relayed the vimpat 100mg  po increase. [facility]  I relayed that [facility] VEEG to be scheduled asap, Amy is  placing a referral to an epileptologist to review her case and help with any other workup that is needed due to continued seizure activity. Pt is wanting to see epileptologist first then have VEEG done. I relayed that she needs both.  I think there is cost for them daily to of $375 for VEEG.  She verbalized understanding.

## 2020-04-04 NOTE — Addendum Note (Signed)
Addended by: Debbora Presto L on: 04/04/2020 04:34 PM   Modules accepted: Orders

## 2020-04-04 NOTE — Telephone Encounter (Signed)
Pt had another seizure on 01-17, she is asking what Amy,NP would suggest

## 2020-04-08 NOTE — Telephone Encounter (Signed)
My preference is to schedule with EMU first. This way, neurology at [facility] has more information to help with second opinion and any needed changes in treatment plan. I suspect if she goes to neurology first, they will ask her to have testing as well. She can decline EMU testing but I am not sure how that effects referral to [facility] neurology.

## 2020-04-08 NOTE — Telephone Encounter (Signed)
Noted  

## 2020-04-08 NOTE — Telephone Encounter (Signed)
Please advise   Hi Disney to get patient scheduled For your  placed on 04/04/2020.  Because patient had not had her apt in November with EMU Patient CX her apt because she was not feeling good . I called patient and she stated she forgot to call . I have reached back out to [place] and she is going to call patient to schedule . Patient is aware. To Listen out for the call .  Elliot Dally Griffin/Neurology [email or web address]> .   After EMU Patient can get scheduled for Neurology .   Amy is this ok with you . Hinton Dyer

## 2020-04-08 NOTE — Telephone Encounter (Signed)
Per [facility] Patient has to Have Her EMU at [place]   01/04/2020  EEG Monitoring Adult EMU Patient has to have this done first.

## 2020-04-10 ENCOUNTER — Encounter (INDEPENDENT_AMBULATORY_CARE_PROVIDER_SITE_OTHER): Payer: Self-pay | Admitting: Internal Medicine

## 2020-04-10 ENCOUNTER — Other Ambulatory Visit: Payer: Self-pay

## 2020-04-10 ENCOUNTER — Ambulatory Visit (INDEPENDENT_AMBULATORY_CARE_PROVIDER_SITE_OTHER): Payer: Medicare HMO | Admitting: Internal Medicine

## 2020-04-10 DIAGNOSIS — K8020 Calculus of gallbladder without cholecystitis without obstruction: Secondary | ICD-10-CM

## 2020-04-10 NOTE — Progress Notes (Signed)
Reason for consultation  Right sided abdominal/flank pain. Cholelithiasis.  History of present illness  Patient is 75 year old Caucasian female who is referred through courtesy of Dr. Delphina Cahill for GI evaluation. She had plain abdominal film on 02/05/2020 and noted to have single calcified stone in gallbladder.  The stone was also noted on CT that she had in August 2020 while she was hospitalized for urosepsis. Patient says she has been having pain in right upper quadrant laterally off and on for the past couple of months.  This pain seemed to get worse with change in posture.  Sometimes she has removed to get relief.  This pain is never triggered by meals.  This pain is not associated with nausea vomiting fever or chills.  Pain is very localized.  She feels heartburn is well controlled with therapy.  She denies dysphagia.  She says she is prone to constipation and trying to watch her diet.  She denies melena or rectal bleeding. Patient is status post right hemicolectomy back in 1994 when she was living in [place].  Her husband tells me that at surgery they found she had infarcted segment of her colon.  They both do not remember the exact diagnosis.  Current Medications: Outpatient Encounter Medications as of 04/10/2020  Medication Sig  . acetaminophen (TYLENOL) 325 MG tablet Take 2 tablets (650 mg total) by mouth every 6 (six) hours as needed for mild pain, fever or headache (or Fever >/= 101).  Marland Kitchen alfuzosin (UROXATRAL) 10 MG 24 hr tablet Take 10 mg by mouth 2 (two) times daily. Patient takes 1 by mouth at breakfast and at bedtime.  Marland Kitchen aspirin EC 81 MG tablet Take 1 tablet (81 mg total) by mouth daily with breakfast.  . benzonatate (TESSALON) 100 MG capsule TAKE 1 CAPSULE DAILY AS NEEDED FOR COUGH  . calcium carbonate (OS-CAL) 600 MG TABS Take 600 mg by mouth 2 (two) times daily before a meal. With Vitamin D   . cilostazol (PLETAL) 50 MG tablet Take 50 mg by mouth 2 (two) times daily.  Patient takes 1 by mouth in the morning and at bedtime.  Marland Kitchen esomeprazole (NEXIUM) 40 MG capsule Take 40 mg by mouth at bedtime.  Marland Kitchen glipiZIDE (GLUCOTROL) 5 MG tablet Take 5 mg by mouth. Take 1 by mouth in the morning , take 1 by mouth at noon, take 1 by mouth every evening.  . Insulin Degludec 100 UNIT/ML SOLN Inject 42 Units into the skin. Takes 42 units  . Lacosamide (VIMPAT) 100 MG TABS Take 100 mg by mouth 2 (two) times daily. Patient takes 1 by mouth in the morning and 1 by mouth at bedtime  . metFORMIN (GLUCOPHAGE) 500 MG tablet 1 TABLET BY MOUTH TWICE DAILY WITH MEALS (Patient taking differently: Takes 2 TABLET BY MOUTH TWICE DAILY WITH MEALS)  . Multiple Vitamins-Minerals (CENTRUM SILVER PO) Take by mouth.  . Probiotic Product (PROBIOTIC DAILY PO) Take by mouth. Patient takes one by mouth at bedtime.  . rosuvastatin (CRESTOR) 10 MG tablet Take 20 mg by mouth daily.   Marland Kitchen tiZANidine (ZANAFLEX) 2 MG tablet Take 2 mg by mouth 2 (two) times daily. PRN  . Ferrous Sulfate (IRON PO) Take 65 mg by mouth daily. (Patient not taking: Reported on 04/10/2020)  . fish oil-omega-3 fatty acids 1000 MG capsule Take 1 g by mouth daily.   (Patient not taking: Reported on 04/10/2020)  . [DISCONTINUED] Lacosamide (VIMPAT) 100 MG TABS Take 1 tablet (100 mg total) by mouth in  the morning and at bedtime. (Patient not taking: Reported on 04/10/2020)  . [DISCONTINUED] levETIRAcetam (KEPPRA) 750 MG tablet Take 2 tablets (1,500 mg total) by mouth 2 (two) times daily. (Patient not taking: Reported on 04/10/2020)  . [DISCONTINUED] pioglitazone (ACTOS) 30 MG tablet Take 30 mg by mouth daily. (Patient not taking: Reported on 04/10/2020)   No facility-administered encounter medications on file as of 04/10/2020.   Past medical history Past Medical History:  Diagnosis Date  . Anemia   . Arthritis   . Asthma    mild  .  History of colonic polyps.  She had colonoscopy in November 2016 with removal of small tubular adenoma.  Follow-up  exam recommended in 7 years.   . Diabetes mellitus type 2, uncomplicated (HCC)    x 10 yrs  . Diabetic neuropathy ([place]) rt foot  . Headache(784.0)   .  Chronic GERD.   Marland Kitchen Hyperlipidemia   . Hypertension   . Hypertriglyceridemia 2002  . IBS (irritable bowel syndrome)   . Neuropathy   . Seizures ([place])    last sz 01/03/2019   Past Surgical History:  Procedure Laterality Date  . APPENDECTOMY    . BREAST SURGERY  1999   left breast biopsy  . CERVICAL SPINE SURGERY  december 2003  . COLON SURGERY right hemicolectomy in 1994?  Cecal volvulus  1994   for unknown reason.  They thought she had a mass.  . COLONOSCOPY  12/04/2010   Procedure: COLONOSCOPY;  Surgeon: Rogene Houston, MD;  [facility];  Service: Endoscopy;  Laterality: N/A;  3:00  . COLONOSCOPY N/A 01/18/2015   Procedure: COLONOSCOPY;  Surgeon: Rogene Houston, MD;  Location: [facility];  Service: Endoscopy;  Laterality: N/A;  955 - moved to 10:40 - Ann notified pt    Allergies  Allergies  Allergen Reactions  . Penicillins Other (See Comments)    Vomiting, ge 10 Can take cephal Did it involve swelling of the face/tongue/throat, SOB, or low BP? No Did it involve sudden or severe rash/hives, skin peeling, or any reaction on the inside of your mouth or nose? No Did you need to seek medical attention at a hospital or doctor's office? No When did it last happen? If all above answers are "NO", may proceed with cephalosporin use.   . Prinivil [Lisinopril] Cough     Family history  Patient states she was adopted and no family history is available  Social history  Patient is married.  She has a son age 65 and daughter age 69 in good health.  She works for [employer] for 30 years.  Primarily in office work.  She quit cigarette smoking in 1980 after having smoked 1 to 2 packs/day for over 20 years.  She drinks alcohol occasionally.  Physical examination  Height '5\' 8"'  (1.727 m), weight 245 lb 8 oz  (111.4 kg). Patient is alert and in no acute distress. She is wearing a mask. Conjunctiva is pink. Sclera is nonicteric Oropharyngeal mucosa is normal. She has upper and lower dentures in place. No neck masses or thyromegaly noted. Cardiac exam with regular rhythm normal S1 and S2. No murmur or gallop noted. Lungs are clear to auscultation. Abdomen is full.  On palpation abdomen is soft and there is no tenderness below the costal margin and epigastric region.  However she is noted to be tender over the rib cage between anterior and midaxillary line.  No organomegaly or masses. No LE edema or clubbing noted.  Labs/studies Results:  CBC Latest Ref Rng & Units 01/03/2020 01/04/2019 10/31/2018  WBC 3.4 - 10.8 x10E3/uL 9.9 11.0(H) 13.6(H)  Hemoglobin 11.1 - 15.9 g/dL 11.7 13.5 10.9(L)  Hematocrit 34.0 - 46.6 % 37.7 43.8 35.5(L)  Platelets 150 - 450 x10E3/uL 405 354 265    CMP Latest Ref Rng & Units 01/03/2020 01/04/2019 10/31/2018  Glucose 65 - 99 mg/dL 107(H) 66(L) 132(H)  BUN 8 - 27 mg/dL '13 15 13  ' Creatinine 0.57 - 1.00 mg/dL 0.84 0.79 0.90  Sodium 134 - 144 mmol/L 142 139 139  Potassium 3.5 - 5.2 mmol/L 4.7 4.2 3.8  Chloride 96 - 106 mmol/L 102 102 108  CO2 20 - 29 mmol/L '26 27 22  ' Calcium 8.7 - 10.3 mg/dL 9.6 9.6 8.1(L)  Total Protein 6.0 - 8.5 g/dL 6.9 - -  Total Bilirubin 0.0 - 1.2 mg/dL <0.2 - -  Alkaline Phos 44 - 121 IU/L 70 - -  AST 0 - 40 IU/L 25 - -  ALT 0 - 32 IU/L 17 - -    Hepatic Function Latest Ref Rng & Units 01/03/2020 10/30/2018 01/06/2016  Total Protein 6.0 - 8.5 g/dL 6.9 7.0 6.5  Albumin 3.7 - 4.7 g/dL 4.2 3.5 4.1  AST 0 - 40 IU/L 25 48(H) 59(H)  ALT 0 - 32 IU/L 17 32 68(H)  Alk Phosphatase 44 - 121 IU/L 70 63 76  Total Bilirubin 0.0 - 1.2 mg/dL <0.2 0.5 0.3  Bilirubin, Direct 0.00 - 0.40 mg/dL - - 0.08     Abdominal pelvic CT from 10/30/2018 reviewed and revealed single gallstone.  2 view abdominal film reveals calcified gallstone but no other abnormalities  were noted.  Assessment:  #1.  Patient is 75 year old Caucasian female who recently underwent plain abdominal films and was found to have calcified stone.  Review of her studies revealed a stone was first seen on his CT of October 30, 2018(she was not aware of this finding).  Patient's pain is located very laterally over rib cage.  Her pain is not suggestive of biliary colic or visceral pain.  I suspect this pain is musculoskeletal.  She would be further evaluated with abdominal ultrasound.  If ultrasound shows wall thickening or dilated bile duct would recommend surgery otherwise would monitor her symptomatology for the next few months. LFTs from November 2021 were normal.  #2.  Chronic GERD.  Heartburn is well controlled with therapy.  #3.  History of colonic adenoma.  She had single adenoma removed on colonoscopy of November 2016.  She is not due for another colonoscopy until November 2023.  Recommendations  Patient advised to monitor her pain and see if there is any relationship with meals physical activity or posture. Abdominal ultrasound. Further recommendations to follow.

## 2020-04-10 NOTE — Patient Instructions (Signed)
Keep track of right-sided rib cage pain for the next 1 week; onset duration any associated symptoms and triggers. Physician will call with results of ultrasound and further recommendations.

## 2020-04-11 ENCOUNTER — Other Ambulatory Visit: Payer: Self-pay | Admitting: Diagnostic Neuroimaging

## 2020-04-11 DIAGNOSIS — L110 Acquired keratosis follicularis: Secondary | ICD-10-CM | POA: Diagnosis not present

## 2020-04-11 DIAGNOSIS — E1140 Type 2 diabetes mellitus with diabetic neuropathy, unspecified: Secondary | ICD-10-CM | POA: Diagnosis not present

## 2020-04-11 DIAGNOSIS — M79671 Pain in right foot: Secondary | ICD-10-CM | POA: Diagnosis not present

## 2020-04-11 DIAGNOSIS — M79672 Pain in left foot: Secondary | ICD-10-CM | POA: Diagnosis not present

## 2020-04-11 DIAGNOSIS — G40909 Epilepsy, unspecified, not intractable, without status epilepticus: Secondary | ICD-10-CM

## 2020-04-12 ENCOUNTER — Other Ambulatory Visit: Payer: Self-pay

## 2020-04-12 ENCOUNTER — Ambulatory Visit (HOSPITAL_COMMUNITY)
Admission: RE | Admit: 2020-04-12 | Discharge: 2020-04-12 | Disposition: A | Discharge status: Home / Self Care | Payer: Medicare HMO | Source: Ambulatory Visit | Attending: Internal Medicine | Admitting: Internal Medicine

## 2020-04-12 DIAGNOSIS — N281 Cyst of kidney, acquired: Secondary | ICD-10-CM | POA: Diagnosis not present

## 2020-04-12 DIAGNOSIS — K8020 Calculus of gallbladder without cholecystitis without obstruction: Secondary | ICD-10-CM | POA: Diagnosis not present

## 2020-04-15 NOTE — Telephone Encounter (Signed)
Called patient and advised her levetiracetam was removed from her med list on 04/10/20. She stated that she took her husband's med list instead of hers, and that's how error was made. She stated she is going back tomorrow and will update her med list with them. She stated [facility] is sending her a box of her medicines tomorrow. She is unsure if she needs refills. I advised I'll call them to check. Patient verbalized understanding, appreciation. Called [facility], spoke with Patrina who stated they just refilled levetiracetam 500 mg tabs, take 3 tabs twice daily. She stated they have never gotten a Rx for 750 mg tabs. The Rx does need to be refilled. Called patient who stated she preferred to stay on 500 mg, take 3 tabs twice daily because that is what she is used to, has no difficulty taking three tabs. I advised will send in refill. Patient verbalized understanding, appreciation.

## 2020-04-16 ENCOUNTER — Telehealth: Payer: Self-pay | Admitting: Family Medicine

## 2020-04-16 DIAGNOSIS — G40909 Epilepsy, unspecified, not intractable, without status epilepticus: Secondary | ICD-10-CM

## 2020-04-16 NOTE — Telephone Encounter (Signed)
Please confirm that she has increased vimpat dose to 100mg  BID as directed on 2/3. When was dose increased? We sent referral to [facility] neurology but they require EMU first. She is going to need to set that up as soon as possible Please make sure she still has the contact information. Appt was scheduled in January but she canceled. This is very important to be able to help treat her seizures.

## 2020-04-16 NOTE — Telephone Encounter (Signed)
Called patient who stated she had seizure 20 minutes ago, was sitting and not sure what happened. She gave phone to husband who stated they came home from lunch, and she flopped down in chair. Her mouth opened, sounded like snoring, shaking so badly she knocked everything off table, face kind of contorted, unaware of what was going on, complained blurry vision. It lasted about a minute. She has not missed any doses of medication, no illness. I advised will let NP know and call back with her advice. He verbalized understanding, appreciation.

## 2020-04-16 NOTE — Telephone Encounter (Signed)
Spoke with patient who stated she increased Vimpat to 100 mg BID on day Rx was sent in. I advised she must call [facility] to reschedule the EMU. I gave her name, # e mail for [employer]. She stated she will call in the morning. I advised she call for any problems, questions. Patient verbalized understanding, appreciation.

## 2020-04-16 NOTE — Telephone Encounter (Signed)
Pt asked Amy,NP be made aware that she just had a very bad seizure that scared her and she'd like a call to discuss.

## 2020-04-17 ENCOUNTER — Telehealth (INDEPENDENT_AMBULATORY_CARE_PROVIDER_SITE_OTHER): Payer: Self-pay | Admitting: *Deleted

## 2020-04-17 ENCOUNTER — Other Ambulatory Visit (INDEPENDENT_AMBULATORY_CARE_PROVIDER_SITE_OTHER): Payer: Self-pay | Admitting: *Deleted

## 2020-04-17 NOTE — Telephone Encounter (Signed)
The patient presented to the office 04/16/2020. She was concerned that at the time of her office visit on 04/10/2020 , she had given the wrong medication to [place] , that she had given [place] her husband's medication list. She gave [place] a list from her pharmacy. I have went to the date of her office visit and updated her medication list.

## 2020-04-17 NOTE — Telephone Encounter (Signed)
Called patient who stated she called [facility], was told invalid #. The # she called was incorrect. I gave her correct [phone]. She stated she will call. Patient verbalized understanding, appreciation.

## 2020-04-17 NOTE — Addendum Note (Signed)
Addended by: Grayland Ormond on: 04/17/2020 11:52 AM   Modules accepted: Orders

## 2020-04-17 NOTE — Telephone Encounter (Signed)
Up date .   Patient is scheduled with [facility] .  Maria Esparza (SEE BELOW)  EMU Diagnostic admission is scheduled again for 06/10/20 @ 10am. Thanks,

## 2020-04-17 NOTE — Telephone Encounter (Signed)
Pt has called asking to speak with Rogue Jury again for the phone # she was given and to ask if it is the EMG that she needs to have done again, please call.

## 2020-04-23 NOTE — Telephone Encounter (Signed)
Up date  [place]! Currently No! we are booked out but I can put her on cancellation list.

## 2020-04-23 NOTE — Telephone Encounter (Signed)
Called pt.  Husband witness sz, stated that she had one last Tuesday 04-16-20 lasted , min, then again Saturday 04-20-20 alittle over a minute.  No trigger.  Feel like getting more frequent and worse.  Pt has aura, knows when going to have one, eyes rolls, head back, snorts, shakes all over. After about minute will come out of it, cognitively aware.  Then will be fatigues/lethargic 5 min to hour.  Taking vimpat 100mg  po bid. levetiracetam 1500mg  po bid. Please advise.

## 2020-04-23 NOTE — Telephone Encounter (Signed)
Patient called and Left a message stating she had a seizure Last Tues she had a seizure and Saturday .  I will reach out to sandy at [facility] again and see if she can be seen sooner . I know she is on there CX list .  I will keep you updated .  Thanks Hinton Dyer

## 2020-04-23 NOTE — Telephone Encounter (Signed)
Noted  

## 2020-04-23 NOTE — Telephone Encounter (Signed)
Called pt and LMVM for her that calling to check on her.  No earlier appt at [facility] EMU.

## 2020-04-23 NOTE — Telephone Encounter (Signed)
Please let patient know that I have reviewed her chart. She is on cancellation list for [facility]. Remind her no driving until we get her seizures under control. I am happy to bring her in for labs to assess drug levels if she wishes. I am also open to repeating MRI to make sure there are no changes since last images in 2020 as she continues to have breakthrough activity. Remind her of seizure precautions and to seek medical attention immediately for any prolonged/unusual activity or difficulty breathing.

## 2020-04-23 NOTE — Telephone Encounter (Signed)
Pt returned call. Please call pt back when available.

## 2020-04-24 ENCOUNTER — Telehealth: Payer: Self-pay | Admitting: Family Medicine

## 2020-04-24 ENCOUNTER — Encounter: Payer: Self-pay | Admitting: Family Medicine

## 2020-04-24 ENCOUNTER — Other Ambulatory Visit (INDEPENDENT_AMBULATORY_CARE_PROVIDER_SITE_OTHER): Payer: Self-pay

## 2020-04-24 DIAGNOSIS — D509 Iron deficiency anemia, unspecified: Secondary | ICD-10-CM | POA: Diagnosis not present

## 2020-04-24 DIAGNOSIS — Z8673 Personal history of transient ischemic attack (TIA), and cerebral infarction without residual deficits: Secondary | ICD-10-CM | POA: Diagnosis not present

## 2020-04-24 DIAGNOSIS — Z0000 Encounter for general adult medical examination without abnormal findings: Secondary | ICD-10-CM | POA: Diagnosis not present

## 2020-04-24 DIAGNOSIS — Z0289 Encounter for other administrative examinations: Secondary | ICD-10-CM

## 2020-04-24 DIAGNOSIS — G40909 Epilepsy, unspecified, not intractable, without status epilepticus: Secondary | ICD-10-CM | POA: Diagnosis not present

## 2020-04-24 DIAGNOSIS — Z6835 Body mass index (BMI) 35.0-35.9, adult: Secondary | ICD-10-CM | POA: Diagnosis not present

## 2020-04-24 DIAGNOSIS — R5383 Other fatigue: Secondary | ICD-10-CM | POA: Diagnosis not present

## 2020-04-24 DIAGNOSIS — J441 Chronic obstructive pulmonary disease with (acute) exacerbation: Secondary | ICD-10-CM | POA: Diagnosis not present

## 2020-04-24 DIAGNOSIS — J069 Acute upper respiratory infection, unspecified: Secondary | ICD-10-CM | POA: Diagnosis not present

## 2020-04-24 DIAGNOSIS — G629 Polyneuropathy, unspecified: Secondary | ICD-10-CM | POA: Diagnosis not present

## 2020-04-24 DIAGNOSIS — E1169 Type 2 diabetes mellitus with other specified complication: Secondary | ICD-10-CM | POA: Diagnosis not present

## 2020-04-24 DIAGNOSIS — G4089 Other seizures: Secondary | ICD-10-CM | POA: Diagnosis not present

## 2020-04-24 NOTE — Telephone Encounter (Signed)
[employer] medicare order sent to GI. They will obtain the auth and reach out to the patient to schedule.  °

## 2020-04-24 NOTE — Telephone Encounter (Signed)
I called and spoke to pt.  She will come in and have labs drawn, takes her am dose about 1000.  She will come in 04-24-20 and is ok have a MRI scheduled.  Placed lacosamide and levetiracetam levels, sign off

## 2020-04-24 NOTE — Addendum Note (Signed)
Addended by: Brandon Melnick on: 04/24/2020 07:19 AM   Modules accepted: Orders

## 2020-04-24 NOTE — Addendum Note (Signed)
Addended by: Debbora Presto L on: 04/24/2020 07:38 AM   Modules accepted: Orders

## 2020-04-25 ENCOUNTER — Telehealth: Payer: Self-pay | Admitting: Family Medicine

## 2020-04-25 NOTE — Telephone Encounter (Signed)
Wonderful. 

## 2020-04-25 NOTE — Telephone Encounter (Signed)
Maria Esparza (SEE BELOW)  EMU admission has been rescheduled to 05/10/20 @ 10am (from cancellation list-requested by Dr Annell Greening office) *Please make changes again J Thanks,

## 2020-04-29 DIAGNOSIS — Z8673 Personal history of transient ischemic attack (TIA), and cerebral infarction without residual deficits: Secondary | ICD-10-CM | POA: Diagnosis not present

## 2020-04-29 DIAGNOSIS — E782 Mixed hyperlipidemia: Secondary | ICD-10-CM | POA: Diagnosis not present

## 2020-04-29 DIAGNOSIS — Z8669 Personal history of other diseases of the nervous system and sense organs: Secondary | ICD-10-CM | POA: Diagnosis not present

## 2020-04-29 DIAGNOSIS — I10 Essential (primary) hypertension: Secondary | ICD-10-CM | POA: Diagnosis not present

## 2020-04-29 DIAGNOSIS — E875 Hyperkalemia: Secondary | ICD-10-CM | POA: Diagnosis not present

## 2020-04-29 DIAGNOSIS — E1169 Type 2 diabetes mellitus with other specified complication: Secondary | ICD-10-CM | POA: Diagnosis not present

## 2020-04-29 DIAGNOSIS — L989 Disorder of the skin and subcutaneous tissue, unspecified: Secondary | ICD-10-CM | POA: Diagnosis not present

## 2020-04-29 DIAGNOSIS — G4089 Other seizures: Secondary | ICD-10-CM | POA: Diagnosis not present

## 2020-04-29 DIAGNOSIS — N3281 Overactive bladder: Secondary | ICD-10-CM | POA: Diagnosis not present

## 2020-04-29 LAB — LACOSAMIDE: Lacosamide: 4.8 ug/mL — ABNORMAL LOW (ref 5.0–10.0)

## 2020-04-29 LAB — LEVETIRACETAM LEVEL: Levetiracetam Lvl: 30.9 ug/mL (ref 10.0–40.0)

## 2020-05-02 ENCOUNTER — Ambulatory Visit: Payer: Medicare HMO | Admitting: General Surgery

## 2020-05-02 ENCOUNTER — Other Ambulatory Visit: Payer: Self-pay

## 2020-05-02 ENCOUNTER — Encounter: Payer: Self-pay | Admitting: General Surgery

## 2020-05-02 VITALS — BP 166/71 | HR 72 | Temp 97.1°F | Resp 14 | Ht 68.0 in | Wt 248.0 lb

## 2020-05-02 DIAGNOSIS — K8020 Calculus of gallbladder without cholecystitis without obstruction: Secondary | ICD-10-CM | POA: Diagnosis not present

## 2020-05-02 NOTE — Patient Instructions (Signed)
Chest Wall Pain Chest wall pain is pain in or around the bones and muscles of your chest. Sometimes, an injury causes this pain. Excessive coughing or overuse of arm and chest muscles may also cause chest wall pain. Sometimes, the cause may not be known. This pain may take several weeks or longer to get better. Follow these instructions at home: Managing pain, stiffness, and swelling If directed, put ice on the painful area: Put ice in a plastic bag. Place a towel between your skin and the bag. Leave the ice on for 20 minutes, 2-3 times per day.   Activity Rest as told by your health care provider. Avoid activities that cause pain. These include any activities that use your chest muscles or your abdominal and side muscles to lift heavy items. Ask your health care provider what activities are safe for you. General instructions Take over-the-counter and prescription medicines only as told by your health care provider. Do not use any products that contain nicotine or tobacco, such as cigarettes, e-cigarettes, and chewing tobacco. These can delay healing after injury. If you need help quitting, ask your health care provider. Keep all follow-up visits as told by your health care provider. This is important.   Contact a health care provider if: You have a fever. Your chest pain becomes worse. You have new symptoms. Get help right away if: You have nausea or vomiting. You feel sweaty or light-headed. You have a cough with mucus from your lungs (sputum) or you cough up blood. You develop shortness of breath. These symptoms may represent a serious problem that is an emergency. Do not wait to see if the symptoms will go away. Get medical help right away. Call your local emergency services (911 in the [place] Do not drive yourself to the hospital. Summary Chest wall pain is pain in or around the bones and muscles of your chest. Depending on the cause, it may be treated with ice, rest, medicines, and  avoiding activities that cause pain. Contact a health care provider if you have a fever, worsening chest pain, or new symptoms. Get help right away if you feel light-headed or you develop shortness of breath. These symptoms may be an emergency. This information is not intended to replace advice given to you by your health care provider. Make sure you discuss any questions you have with your health care provider. Document Revised: 08/19/2017 Document Reviewed: 08/19/2017 Elsevier Patient Education  2021 [place]. Costochondritis  Costochondritis is inflammation of the tissue (cartilage) that connects the ribs to the breastbone (sternum). This causes pain in the front of the chest. The pain usually starts slowly and involves more than one rib. What are the causes? The exact cause of this condition is not always known. It results from stress on the cartilage where your ribs attach to your sternum. The cause of this stress could be:  Chest injury.  Exercise or activity, such as lifting.  Severe coughing. What increases the risk? You are more likely to develop this condition if you:  Are female.  Are 28-16 years old.  Recently started a new exercise or work activity.  Have low levels of vitamin D.  Have a condition that makes you cough frequently. What are the signs or symptoms? The main symptom of this condition is chest pain. The pain:  Usually starts gradually and can be sharp or dull.  Gets worse with deep breathing, coughing, or exercise.  Gets better with rest.  May be worse when you  press on the affected area of your ribs and sternum. How is this diagnosed? This condition is diagnosed based on your symptoms, your medical history, and a physical exam. Your health care provider will check for pain when pressing on your sternum. You may also have tests to rule out other causes of chest pain. These may include:  A chest X-ray to check for lung problems.  An ECG  (electrocardiogram) to see if you have a heart problem that could be causing the pain.  An imaging scan to rule out a chest or rib fracture. How is this treated? This condition usually goes away on its own over time. Your health care provider may prescribe an NSAID, such as ibuprofen, to reduce pain and inflammation. Treatment may also include:  Resting and avoiding activities that make pain worse.  Applying heat or ice to the area to reduce pain and inflammation.  Doing exercises to stretch your chest muscles. If these treatments do not help, your health care provider may inject a numbing medicine at the sternum-rib connection to help relieve the pain. Follow these instructions at home: Managing pain, stiffness, and swelling  If directed, put ice on the painful area. To do this: ? Put ice in a plastic bag. ? Place a towel between your skin and the bag. ? Leave the ice on for 20 minutes, 2-3 times a day.  If directed, apply heat to the affected area as often as told by your health care provider. Use the heat source that your health care provider recommends, such as a moist heat pack or a heating pad. ? Place a towel between your skin and the heat source. ? Leave the heat on for 20-30 minutes. ? Remove the heat if your skin turns bright red. This is especially important if you are unable to feel pain, heat, or cold. You may have a greater risk of getting burned.      Activity  Rest as told by your health care provider. Avoid activities that make pain worse. This includes any activities that use chest, abdominal, and side muscles.  Do not lift anything that is heavier than 10 lb (4.5 kg), or the limit that you are told, until your health care provider says that it is safe.  Return to your normal activities as told by your health care provider. Ask your health care provider what activities are safe for you. General instructions  Take over-the-counter and prescription medicines only as  told by your health care provider.  Keep all follow-up visits as told by your health care provider. This is important. Contact a health care provider if:  You have chills or a fever.  Your pain does not go away or it gets worse.  You have a cough that does not go away. Get help right away if:  You have shortness of breath.  You have severe chest pain that is not relieved by medicines, heat, or ice. These symptoms may represent a serious problem that is an emergency. Do not wait to see if the symptoms will go away. Get medical help right away. Call your local emergency services (911 in the [place] Do not drive yourself to the hospital.  Summary  Costochondritis is inflammation of the tissue (cartilage) that connects the ribs to the breastbone (sternum).  This condition causes pain in the front of the chest.  Costochondritis results from stress on the cartilage where your ribs attach to your sternum.  Treatment may include medicines, rest, heat or  ice, and exercises. This information is not intended to replace advice given to you by your health care provider. Make sure you discuss any questions you have with your health care provider. Document Revised: 12/30/2018 Document Reviewed: 12/30/2018 Elsevier Patient Education  2021 [place]. Cholelithiasis  Cholelithiasis is a disease in which gallstones form in the gallbladder. The gallbladder is an organ that stores bile. Bile is a fluid that helps to digest fats. Gallstones begin as small crystals and can slowly grow into stones. They may cause no symptoms until they block the gallbladder duct, or cystic duct, when the gallbladder tightens (contracts) after food is eaten. This can cause pain and is known as a gallbladder attack, or biliary colic. There are two main types of gallstones:  Cholesterol stones. These are the most common type of gallstone. These stones are made of hardened cholesterol and are usually yellow-green in color.  Cholesterol is a fat-like substance that is made in the liver.  Pigment stones. These are dark in color and are made of a red-yellow substance, called bilirubin,that forms when hemoglobin from red blood cells breaks down. What are the causes? This condition may be caused by an imbalance in the different parts that make bile. This can happen if the bile:  Has too much bilirubin. This can happen in certain blood diseases, such as sickle cell anemia.  Has too much cholesterol.  Does not have enough bile salts. These salts help the body absorb and digest fats. In some cases, this condition can also be caused by the gallbladder not emptying completely or often enough. This is common during pregnancy. What increases the risk? The following factors may make you more likely to develop this condition:  Being female.  Having multiple pregnancies. Health care providers sometimes advise removing diseased gallbladders before future pregnancies.  Eating a diet that is heavy in fried foods, fat, and refined carbohydrates, such as white bread and white rice.  Being obese.  Being older than age 48.  Using medicines that contain female hormones (estrogen) for a long time.  Losing weight quickly.  Having a family history of gallstones.  Having certain medical problems, such as: ? Diabetes mellitus. ? Cystic fibrosis. ? Crohn's disease. ? Cirrhosis or other long-term (chronic) liver disease. ? Certain blood diseases, such as sickle cell anemia or leukemia. What are the signs or symptoms? In many cases, having gallstones causes no symptoms. When you have gallstones but do not have symptoms, you have silent gallstones. If a gallstone blocks your bile duct, it can cause a gallbladder attack. The main symptom of a gallbladder attack is sudden pain in the upper right part of the abdomen. The pain:  Usually comes at night or after eating.  Can last for one hour or more.  Can spread to your right  shoulder, back, or chest.  Can feel like indigestion. This is discomfort, burning, or fullness in your upper abdomen. If the bile duct is blocked for more than a few hours, it can cause an infection or inflammation of your gallbladder (cholecystitis), liver, or pancreas. This can cause:  Nausea or vomiting.  Bloating.  Pain in your abdomen that lasts for 5 hours or longer.  Tenderness in your upper abdomen, often in the upper right section and under your rib cage.  Fever or chills.  Skin or the white parts of your eyes turning yellow (jaundice). This usually happens when a stone has blocked bile from passing through the common bile duct.  Dark  urine or light-colored stools. How is this diagnosed? This condition may be diagnosed based on:  A physical exam.  Your medical history.  Ultrasound.  CT scan.  MRI. You may also have other tests, including:  Blood tests to check for signs of an infection or inflammation.  Cholescintigraphy, or HIDA scan. This is a scan of your gallbladder and bile ducts (biliary system) using non-harmful radioactive material and special cameras that can see the radioactive material.  Endoscopic retrograde cholangiopancreatogram. This involves inserting a small tube with a camera on the end (endoscope) through your mouth to look at bile ducts and check for blockages. How is this treated? Treatment for this condition depends on the severity of the condition. Silent gallstones do not need treatment. Treatment may be needed if a blockage causes a gallbladder attack or other symptoms. Treatment may include:  Home care, if symptoms are not severe. ? During a simple gallbladder attack, stop eating and drinking for 12-24 hours (except for water and clear liquids). This helps to "cool down" your gallbladder. After 1 or 2 days, you can start to eat a diet of simple or clear foods, such as broths and crackers. ? You may also need medicines for pain or nausea or  both. ? If you have cholecystitis and an infection, you will need antibiotics.  A hospital stay, if needed for pain control or for cholecystitis with severe infection.  Cholecystectomy, or surgery to remove your gallbladder. This is the most common treatment if all other treatments have not worked.  Medicines to break up gallstones. These are most effective at treating small gallstones. Medicines may be used for up to 6-12 months.  Endoscopic retrograde cholangiopancreatogram. A small basket can be attached to the endoscope and used to capture and remove gallstones, mainly those that are in the common bile duct. Follow these instructions at home: Medicines  Take over-the-counter and prescription medicines only as told by your health care provider.  If you were prescribed an antibiotic medicine, take it as told by your health care provider. Do not stop taking the antibiotic even if you start to feel better.  Ask your health care provider if the medicine prescribed to you requires you to avoid driving or using machinery. Eating and drinking  Drink enough fluid to keep your urine pale yellow. This is important during a gallbladder attack. Water and clear liquids are preferred.  Follow a healthy diet. This includes: ? Reducing fatty foods, such as fried food and foods high in cholesterol. ? Reducing refined carbohydrates, such as white bread and white rice. ? Eating more fiber. Aim for foods such as almonds, fruit, and beans. Alcohol use  If you drink alcohol: ? Limit how much you use to:  0-1 drink a day for nonpregnant women.  0-2 drinks a day for men. ? Be aware of how much alcohol is in your drink. In the [place] one drink equals one 12 oz bottle of beer (355 mL), one 5 oz glass of wine (148 mL), or one 1 oz glass of hard liquor (44 mL). General instructions  Do not use any products that contain nicotine or tobacco, such as cigarettes, e-cigarettes, and chewing tobacco. If you  need help quitting, ask your health care provider.  Maintain a healthy weight.  Keep all follow-up visits as told by your health care provider. These may include consultations with a surgeon or specialist. This is important. Where to find more information  [employer] of Diabetes and Digestive and  Kidney Diseases: [email or web address] Contact a health care provider if:  You think you have had a gallbladder attack.  You have been diagnosed with silent gallstones and you develop pain in your abdomen or indigestion.  You begin to have attacks more often.  You have dark urine or light-colored stools. Get help right away if:  You have pain from a gallbladder attack that lasts for more than 2 hours.  You have pain in your abdomen that lasts for more than 5 hours or is getting worse.  You have a fever or chills.  You have nausea and vomiting that do not go away.  You develop jaundice. Summary  Cholelithiasis is a disease in which gallstones form in the gallbladder.  This condition may be caused by an imbalance in the different parts that make bile. This can happen if your bile has too much bilirubin or cholesterol, or does not have enough bile salts.  Treatment for gallstones depends on the severity of the condition. Silent gallstones do not need treatment.  If gallstones cause a gallbladder attack or other symptoms, treatment usually involves not eating or drinking anything. Treatment may also include pain medicines and antibiotics, and it sometimes includes a hospital stay.  Surgery to remove the gallbladder is common if all other treatments have not worked. This information is not intended to replace advice given to you by your health care provider. Make sure you discuss any questions you have with your health care provider. Document Revised: 01/09/2019 Document Reviewed: 01/09/2019 Elsevier Patient Education  2021 [employer].

## 2020-05-03 NOTE — Progress Notes (Signed)
Maria Esparza; [id number]; 12-27-1945   HPI Patient is a 75 year old white female who was referred to my care by Dr. Laural Golden and [place] Crane for evaluation and treatment of cholelithiasis.  Patient followed up with Dr. Roderic Palau recently after a plain film and CT scan of the abdomen was performed which revealed cholelithiasis.  She has been having right upper quadrant abdominal pain along the lateral aspect of the abdomen for the past few months.  It is made worse with movement.  She denies any nausea, vomiting, fatty food intolerance, or trigger foods that bring on the abdominal pain.  She denies any fever, chills, or jaundice.  She states she is able to point to an area specifically on the right side which exacerbates her pain.  Coughing makes her pain worse.  She does have a history of pleurisy. Past Medical History:  Diagnosis Date  . Anemia   . Arthritis   . Asthma    mild  . Cough   . Diabetes mellitus type 2, uncomplicated (HCC)    x 10 yrs  . Diabetic neuropathy ([place]) rt foot  . Headache(784.0)   . Hypercholesteremia   . Hyperlipidemia   . Hypertension   . Hypertriglyceridemia 2002  . IBS (irritable bowel syndrome)   . Neuropathy   . Seizures ([place])    last sz 01/03/2019    Past Surgical History:  Procedure Laterality Date  . APPENDECTOMY    . BREAST SURGERY  1999   left breast biopsy  . CERVICAL SPINE SURGERY  december 2003  . [place]   for unknown reason.  They thought she had a mass.  . COLONOSCOPY  12/04/2010   Procedure: COLONOSCOPY;  Surgeon: Rogene Houston, MD;  Location: [facility];  Service: Endoscopy;  Laterality: N/A;  3:00  . COLONOSCOPY N/A 01/18/2015   Procedure: COLONOSCOPY;  Surgeon: Rogene Houston, MD;  Location: [facility];  Service: Endoscopy;  Laterality: N/A;  955 - moved to 10:40 - Ann notified pt    Family History  Adopted: Yes    Current Outpatient Medications on File Prior to Visit  Medication Sig Dispense Refill  . aspirin EC 81  MG tablet Take 1 tablet (81 mg total) by mouth daily with breakfast. 30 tablet 2  . calcium carbonate (OS-CAL) 600 MG TABS Take 600 mg by mouth daily with breakfast. With Vitamin D    . fish oil-omega-3 fatty acids 1000 MG capsule Take 1 g by mouth daily.    . Insulin Degludec 100 UNIT/ML SOLN Inject 40 Units into the skin daily. Takes 42 units    . Lacosamide (VIMPAT) 100 MG TABS Take 100 mg by mouth 2 (two) times daily. Patient takes 1 by mouth in the morning and 1 by mouth at bedtime    . levETIRAcetam (KEPPRA) 500 MG tablet TAKE THREE TABLETS BY MOUTH EVERY MORNING and TAKE THREE TABLETS BY MOUTH EVERY EVENING 540 tablet 3  . meloxicam (MOBIC) 15 MG tablet Take 15 mg by mouth daily.    . metFORMIN (GLUCOPHAGE) 500 MG tablet 1 TABLET BY MOUTH TWICE DAILY WITH MEALS (Patient taking differently: Takes 2 TABLET BY MOUTH TWICE DAILY WITH MEALS) 60 tablet 5  . Multiple Vitamins-Minerals (CENTRUM SILVER PO) Take by mouth.    . oxybutynin (DITROPAN-XL) 5 MG 24 hr tablet Take by mouth.    . pioglitazone (ACTOS) 30 MG tablet Take 30 mg by mouth daily.    . Probiotic Product (PROBIOTIC DAILY PO) Take  by mouth. Patient takes one by mouth at bedtime.    . rosuvastatin (CRESTOR) 20 MG tablet Take 20 mg by mouth at bedtime.    Marland Kitchen tiZANidine (ZANAFLEX) 2 MG tablet Take 2 mg by mouth 2 (two) times daily. PRN     No current facility-administered medications on file prior to visit.    Allergies  Allergen Reactions  . Penicillins Other (See Comments)    Vomiting, ge 10 Can take cephal Did it involve swelling of the face/tongue/throat, SOB, or low BP? No Did it involve sudden or severe rash/hives, skin peeling, or any reaction on the inside of your mouth or nose? No Did you need to seek medical attention at a hospital or doctor's office? No When did it last happen? If all above answers are "NO", may proceed with cephalosporin use.   . Prinivil [Lisinopril] Cough    Social History   Substance  and Sexual Activity  Alcohol Use Yes  . Alcohol/week: 0.0 standard drinks   Comment: very rare occasion on a holiday    Social History   Tobacco Use  Smoking Status Former Smoker  . Packs/day: 3.00  . Years: 20.00  . Pack years: 60.00  Smokeless Tobacco Never Used  Tobacco Comment   Quit smoking in early 80s after smoking 20 yrs,.    Review of Systems  Constitutional: Positive for malaise/fatigue.  HENT: Negative.   Eyes: Negative.   Respiratory: Negative.   Cardiovascular: Negative.   Gastrointestinal: Negative.   Genitourinary: Negative.   Musculoskeletal: Positive for back pain.  Skin: Negative.   Neurological: Positive for sensory change.  Endo/Heme/Allergies: Negative.   Psychiatric/Behavioral: Negative.     Objective   Vitals:   05/02/20 0929  BP: (!) 166/71  Pulse: 72  Resp: 14  Temp: (!) 97.1 F (36.2 C)  SpO2: 95%    Physical Exam Vitals reviewed.  Constitutional:      Appearance: Normal appearance. She is not ill-appearing.  HENT:     Head: Normocephalic and atraumatic.  Eyes:     General: No scleral icterus. Cardiovascular:     Rate and Rhythm: Normal rate and regular rhythm.     Heart sounds: Normal heart sounds. No murmur heard. No gallop.   Pulmonary:     Effort: Pulmonary effort is normal. No respiratory distress.     Breath sounds: Normal breath sounds. No stridor. No wheezing, rhonchi or rales.     Comments: Point tenderness noted along the lateral right costal margin.  This does reproduce her pain. Chest:     Chest wall: Tenderness present.  Abdominal:     General: Bowel sounds are normal. There is no distension.     Palpations: Abdomen is soft. There is no mass.     Tenderness: There is no abdominal tenderness. There is no guarding or rebound.     Hernia: No hernia is present.     Comments: No right upper quadrant abdominal pain is elicited.  Skin:    General: Skin is warm and dry.  Neurological:     Mental Status: She is  alert and oriented to person, place, and time.    Dr. Olevia Perches notes reviewed Assessment  Cholelithiasis, asymptomatic Costochondritis/costal margin rib pain Plan   I told the patient that I would not recommend cholecystectomy as her pain is more concentrated to the rib cage.  She does have a history of pleurisy.  She understands this and agrees.  Literature was given concerning biliary colic and cholelithiasis.  Follow-up as needed.  She may return to Dr. [place] Crane for further management and treatment of her rib pain.

## 2020-05-07 ENCOUNTER — Telehealth: Payer: Self-pay | Admitting: *Deleted

## 2020-05-07 NOTE — Telephone Encounter (Addendum)
Called patient and let her know that her labs look ok with the exception of slightly low lacosamide level. Normal is 5-10 and her level was 4.8. This could be low due to timing of labs and medication administration. Maria Esparza knows she is scheduled for VEEG 3/11 so she would like to see what this shows and get input from epileptology at [facility] before making any specific changes in treatment plan. Has she had any more seizures? Seizure precautions advised. We will follow up pending test results and consult with Maria Esparza. She stated her last seizure was 04/26/20. Advised she call for any seizures before 05/10/20. She verbalized understanding, appreciation.

## 2020-05-08 ENCOUNTER — Ambulatory Visit
Admission: RE | Admit: 2020-05-08 | Discharge: 2020-05-08 | Disposition: A | Discharge status: Home / Self Care | Payer: Medicare HMO | Source: Ambulatory Visit | Attending: Family Medicine | Admitting: Family Medicine

## 2020-05-08 DIAGNOSIS — G40909 Epilepsy, unspecified, not intractable, without status epilepticus: Secondary | ICD-10-CM | POA: Diagnosis not present

## 2020-05-08 MED ORDER — GADOBENATE DIMEGLUMINE 529 MG/ML IV SOLN
20.0000 mL | Freq: Once | INTRAVENOUS | Status: AC | PRN
  Administered 2020-05-08: 20 mL via INTRAVENOUS

## 2020-05-09 ENCOUNTER — Telehealth: Payer: Self-pay | Admitting: *Deleted

## 2020-05-09 NOTE — Telephone Encounter (Signed)
Spoke with patient and let her know that her MRI was unremarkable. There is some atrophy (shrinking of brain volume) and small vessel ischemia. These are stable and chronic. Healthy lifestyle habits recommended and close follow up with PCP to manage co morbidities, [id number], diabetes. Patient verbalized understanding, appreciation.

## 2020-05-10 DIAGNOSIS — I10 Essential (primary) hypertension: Secondary | ICD-10-CM | POA: Diagnosis not present

## 2020-05-10 DIAGNOSIS — R258 Other abnormal involuntary movements: Secondary | ICD-10-CM | POA: Diagnosis not present

## 2020-05-10 DIAGNOSIS — E119 Type 2 diabetes mellitus without complications: Secondary | ICD-10-CM | POA: Diagnosis not present

## 2020-05-10 DIAGNOSIS — G40909 Epilepsy, unspecified, not intractable, without status epilepticus: Secondary | ICD-10-CM | POA: Diagnosis not present

## 2020-05-10 DIAGNOSIS — G4700 Insomnia, unspecified: Secondary | ICD-10-CM | POA: Diagnosis not present

## 2020-05-10 DIAGNOSIS — Z79899 Other long term (current) drug therapy: Secondary | ICD-10-CM | POA: Diagnosis not present

## 2020-05-10 DIAGNOSIS — R32 Unspecified urinary incontinence: Secondary | ICD-10-CM | POA: Diagnosis not present

## 2020-05-10 DIAGNOSIS — R69 Illness, unspecified: Secondary | ICD-10-CM | POA: Diagnosis not present

## 2020-05-10 DIAGNOSIS — R569 Unspecified convulsions: Secondary | ICD-10-CM | POA: Diagnosis not present

## 2020-05-10 DIAGNOSIS — E785 Hyperlipidemia, unspecified: Secondary | ICD-10-CM | POA: Diagnosis not present

## 2020-05-14 DIAGNOSIS — R569 Unspecified convulsions: Secondary | ICD-10-CM | POA: Diagnosis not present

## 2020-05-14 NOTE — Telephone Encounter (Signed)
Called Lelan Pons, epilepsy clinic coordinator, LVM requesting call back and  fax VEEG report for MD review.

## 2020-05-14 NOTE — Telephone Encounter (Signed)
Maria Esparza from [facility] is requesting to have a follow up for pt. per VEEG results. Please advise.  Best contact: [phone]

## 2020-05-15 NOTE — Telephone Encounter (Signed)
Can [facility] get her in soon? VEEG shows one night of rare, poorly formed, sharp waves in left and right temporal region. No seizures were identified. Recommendation for VEEG with spell capture. Would like epileptologist eval if possible.

## 2020-05-15 NOTE — Telephone Encounter (Signed)
Printed VEEG report form [facility]. Placed on NP's desk for review.

## 2020-05-16 NOTE — Telephone Encounter (Signed)
Ok. I am hoping they can see her sooner. How has she been? Any other seizures? I think the last one I have documented was 2/25? She is now taking levetiracetam 1500mg  BID and lacosamide 100mg  BID, correct?

## 2020-05-16 NOTE — Telephone Encounter (Signed)
UP- date   Called and spoke to patient's husband Relayed Patient has apt at [facility] with Dr. Matilde Bash  July 12 th arrive 8:30 apt at 9:00 am . Patient is on CX list . [place]    Telephone [phone] - Fax [phone]

## 2020-05-16 NOTE — Telephone Encounter (Signed)
Called patient, LVM requesting call back. 

## 2020-05-16 NOTE — Telephone Encounter (Signed)
Patient called, stated she's had no more seizures, confirmed she's taking seizure medications as prescribed. Reminded her of appointment with [facility] in July, hope she can be seen sooner. Patient verbalized understanding, appreciation.

## 2020-05-21 ENCOUNTER — Telehealth: Payer: Self-pay | Admitting: Family Medicine

## 2020-05-21 NOTE — Telephone Encounter (Signed)
Pt called,today while volunteering at [employer]; at  12:30p I had seizure. Nurse checked my BP and my husband came to pick me up. Would like a call from the nurse.

## 2020-05-22 MED ORDER — VIMPAT 100 MG PO TABS: 150.0000 mg | ORAL_TABLET | Freq: Two times a day (BID) | ORAL | 1 refills | Status: DC

## 2020-05-22 NOTE — Telephone Encounter (Signed)
Called pt.  She had seizures 1-2 min duration, whole volunteering at [facility] (10-1p) on Tuesday.  No aura.  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 No new medications. No triggers she is aware of.  Has been compliant on medications  (vimpat mg po bid, keppra 1500mg  po bid).  5 day VEEG (no sz).  Her Bp 119/97 when had sz, and blood sugar at home 141.

## 2020-05-22 NOTE — Telephone Encounter (Signed)
Called pt.  LMVM for them to return call.

## 2020-05-22 NOTE — Addendum Note (Signed)
Addended by: Brandon Melnick on: 05/22/2020 04:54 PM   Modules accepted: Orders

## 2020-05-22 NOTE — Telephone Encounter (Signed)
I called pt relayed plan vimpat 150mg  po bid.  She just picked up vimpat 100mg  tabs not scored. (60 tabsl).  She is on cancellation list I believe already [facility].  I called and spoke to pharmacist.  They will cancel the prscription that was just done.  Do 50mg  tabs #60, to use up the 100mg  tabs she just got, and then next time do 150mg  po bid.

## 2020-05-23 MED ORDER — LACOSAMIDE 50 MG PO TABS: 50.0000 mg | ORAL_TABLET | Freq: Two times a day (BID) | ORAL | 0 refills | Status: DC

## 2020-05-23 NOTE — Addendum Note (Signed)
Addended byUbaldo Glassing, Jourdain Guay L on: 05/23/2020 08:01 AM   Modules accepted: Orders

## 2020-05-29 DIAGNOSIS — L989 Disorder of the skin and subcutaneous tissue, unspecified: Secondary | ICD-10-CM | POA: Diagnosis not present

## 2020-05-29 DIAGNOSIS — E1169 Type 2 diabetes mellitus with other specified complication: Secondary | ICD-10-CM | POA: Diagnosis not present

## 2020-05-29 DIAGNOSIS — I10 Essential (primary) hypertension: Secondary | ICD-10-CM | POA: Diagnosis not present

## 2020-05-29 DIAGNOSIS — E875 Hyperkalemia: Secondary | ICD-10-CM | POA: Diagnosis not present

## 2020-05-29 DIAGNOSIS — Z8673 Personal history of transient ischemic attack (TIA), and cerebral infarction without residual deficits: Secondary | ICD-10-CM | POA: Diagnosis not present

## 2020-05-29 DIAGNOSIS — Z23 Encounter for immunization: Secondary | ICD-10-CM | POA: Diagnosis not present

## 2020-05-29 DIAGNOSIS — N3281 Overactive bladder: Secondary | ICD-10-CM | POA: Diagnosis not present

## 2020-05-29 DIAGNOSIS — E782 Mixed hyperlipidemia: Secondary | ICD-10-CM | POA: Diagnosis not present

## 2020-05-29 DIAGNOSIS — G4089 Other seizures: Secondary | ICD-10-CM | POA: Diagnosis not present

## 2020-06-03 ENCOUNTER — Ambulatory Visit (INDEPENDENT_AMBULATORY_CARE_PROVIDER_SITE_OTHER): Payer: Medicare HMO | Admitting: Gastroenterology

## 2020-06-10 ENCOUNTER — Telehealth: Payer: Self-pay | Admitting: Family Medicine

## 2020-06-10 MED ORDER — VIMPAT 150 MG PO TABS: 150.0000 mg | ORAL_TABLET | Freq: Two times a day (BID) | ORAL | 5 refills | Status: AC

## 2020-06-10 NOTE — Telephone Encounter (Signed)
[place] controlled drug registry was checked, Note from Amy 05/21/20 to increase Vimpat to 150 mg total, Vimpat 50mg  was last filled 05/23/20, and Vimpat 100 mg was last filled 05/11/20. Pt was last seen with Amy Lomax 01/03/20, advised to come in to complete labs and they was done. No upcoming appt scheduled, please advise.

## 2020-06-10 NOTE — Telephone Encounter (Signed)
[facility] [place] [place]) request refill lacosamide (VIMPAT) 50 MG TABS tablet at [facility].

## 2020-06-10 NOTE — Telephone Encounter (Signed)
Meds ordered this encounter  Medications  . Lacosamide (VIMPAT) 150 MG TABS    Sig: Take 1 tablet (150 mg total) by mouth in the morning and at bedtime.    Dispense:  60 tablet    Refill:  Goehner, MD 0/34/0352, 4:81 PM Certified in Neurology, Neurophysiology and Neuroimaging  [facility] [address], [place],  [address] [phone]

## 2020-06-11 ENCOUNTER — Encounter: Payer: Self-pay | Admitting: Diagnostic Neuroimaging

## 2020-06-11 ENCOUNTER — Ambulatory Visit: Payer: Medicare HMO | Admitting: Diagnostic Neuroimaging

## 2020-06-11 VITALS — BP 174/75 | HR 69 | Ht 68.0 in | Wt 246.8 lb

## 2020-06-11 DIAGNOSIS — G40909 Epilepsy, unspecified, not intractable, without status epilepticus: Secondary | ICD-10-CM

## 2020-06-11 NOTE — Patient Instructions (Signed)
SEIZURE DISORDER (events in 2019-2022; last 06/10/20) - continue levetiracetam 1500mg  twice a day - continue vimpat 150mg  twice a day; may need to increase to 200mg  twice a day  - may consider adding carbamazepine

## 2020-06-11 NOTE — Telephone Encounter (Signed)
Called patient to clarify vimpat dose; she's been taking 150 mg twice daily and keppra as prescribed. Husband stated seizure last night was worse, was blacking out, flailing around. Husband got on phone, stated she fell back in chair, eyes rolled back and she was gone, came out but back into seizure again for a minute. It took her 10-15 minutes to get back to normal. The previous seizure was also different, both more intense than before. She complained of bad headache and weakness. I advised Dr Leta Baptist has opening today, arrive 11 am. Patient, husband agreed to FU. I advised to bring new insurance cards, med bottles. Patient, husband verbalized understanding, appreciation.  Appointment scheduled.

## 2020-06-11 NOTE — Telephone Encounter (Signed)
Pt has called to report that she had another seizure last night at 7pm, pt is asking for a call to discuss.

## 2020-06-11 NOTE — Progress Notes (Signed)
[facility]  PATIENT: Maria Esparza DOB: 1945-04-14  REFERRING CLINICIAN: Nevada Crane, J HISTORY FROM: patient and husband REASON FOR VISIT: follow up    HISTORICAL  CHIEF COMPLAINT:  Chief Complaint  Patient presents with  . Seizures    Rm 6 FU for contiued seizures, husband-  [name]  "waiting for apt with [place] in July; had seizure last night, different  from previous, lasted longer, went into and out of seizure twice"     HISTORY OF PRESENT ILLNESS:   UPDATE (06/11/20, VRP): Since last visit, had VEEG at [facility] --> no spells captured, and essentially normal EEG except for rare temporal sharp waves. Tolerating LEV 1500mg  twice a day + vimpat 150mg  twice a day. Last event last night had an event at 7pm (hollered, 1 minute, shaking, eye rolled back).   UPDATE (04/10/19, VRP): Since last visit, had sz on Nov 3, Dec 9, Dec 30. Now on LEV 1500mg  twice a day since Nov 2020. No alleviating or aggravating factors. Tolerating LEV 1500mg  twice a day.    UPDATE (11/30/18, VRP): Since last visit, was doing well until 10/30/18 --> felt cold at home, then had shaking and confusion at home for few minutes, then 2-3 hours of confusion, went to ER, and dx'd with sepsis / UTI and treated. Since then had 2 more sz events on 11/23/18 and 11/25/18 (few min of shaking arms, eyes open, no post-ictal confusion; no tongue biting; no incontinence).   UPDATE (10/04/18, VRP): Since last visit, doing here for evaluation of numbness and tingling in the feet.  Patient has had this problem for past 3 years.  Patient has diabetes which is under control with medication.  She also has some low back pain without radiating symptoms.  Here for evaluation of neuropathy versus lumbar radiculopathy.  PRIOR HPI (07/06/18): 75 year old female with hypertension, diabetes, hypercholesterolemia, here for evaluation of seizure.  December 2019 patient had episode of possibly back, color change in the face, shaking and  convulsions.  No tongue biting or incontinence.  Episode lasted for 30 to 60 seconds.  Patient had evaluated by neurologist who ordered EEG and MRI.  Currently EEG was unremarkable.  MRI of the brain showed moderate to severe chronic small vessel ischemic disease and moderate atrophy.  Possibility of seizure versus TIA was raised.  Patient did well until April 2020 when she had her second event, similar seizure-like activity.  This time patient was somewhat aware during the event, felt herself going into the shaking.  He was somewhat confused.  Patient had a 3rd event May 2020, also a similar type.  No prior history of seizures.  Patient did have history of head trauma in 2005 requiring multiple sutures in the scalp.   REVIEW OF SYSTEMS: Full 14 system review of systems performed and negative with exception of: as per HPI.   ALLERGIES: Allergies  Allergen Reactions  . Pedi-Pre Tape Spray [Wound Dressing Adhesive] Itching    If placed too long  . Penicillins Other (See Comments)    Vomiting, ge 10 Can take cephal Did it involve swelling of the face/tongue/throat, SOB, or low BP? No Did it involve sudden or severe rash/hives, skin peeling, or any reaction on the inside of your mouth or nose? No Did you need to seek medical attention at a hospital or doctor's office? No When did it last happen? If all above answers are "NO", may proceed with cephalosporin use.   . Prinivil [Lisinopril] Cough  HOME MEDICATIONS: Outpatient Medications Prior to Visit  Medication Sig Dispense Refill  . aspirin EC 81 MG tablet Take 1 tablet (81 mg total) by mouth daily with breakfast. 30 tablet 2  . calcium carbonate (OS-CAL) 600 MG TABS Take 600 mg by mouth daily with breakfast. With Vitamin D    . fish oil-omega-3 fatty acids 1000 MG capsule Take 1 g by mouth daily.    . Insulin Degludec 100 UNIT/ML SOLN Inject 40 Units into the skin daily. Takes 42 units    . Lacosamide (VIMPAT) 150 MG TABS Take 1  tablet (150 mg total) by mouth in the morning and at bedtime. 60 tablet 5  . levETIRAcetam (KEPPRA) 500 MG tablet TAKE THREE TABLETS BY MOUTH EVERY MORNING and TAKE THREE TABLETS BY MOUTH EVERY EVENING 540 tablet 3  . metFORMIN (GLUCOPHAGE) 500 MG tablet 1 TABLET BY MOUTH TWICE DAILY WITH MEALS (Patient taking differently: Takes 2 TABLET BY MOUTH TWICE DAILY WITH MEALS) 60 tablet 5  . Multiple Vitamins-Minerals (CENTRUM SILVER PO) Take by mouth.    . oxybutynin (DITROPAN-XL) 5 MG 24 hr tablet Take by mouth.    . pioglitazone (ACTOS) 30 MG tablet Take 30 mg by mouth daily.    . Probiotic Product (PROBIOTIC DAILY PO) Take by mouth. Patient takes one by mouth at bedtime.    . rosuvastatin (CRESTOR) 20 MG tablet Take 20 mg by mouth at bedtime.    . meloxicam (MOBIC) 15 MG tablet Take 15 mg by mouth daily. (Patient not taking: Reported on 06/11/2020)    . tiZANidine (ZANAFLEX) 2 MG tablet Take 2 mg by mouth 2 (two) times daily. PRN (Patient not taking: Reported on 06/11/2020)     No facility-administered medications prior to visit.    PAST MEDICAL HISTORY: Past Medical History:  Diagnosis Date  . Anemia   . Arthritis   . Asthma    mild  . Cough   . Diabetes mellitus type 2, uncomplicated (HCC)    x 10 yrs  . Diabetic neuropathy ([place]) rt foot  . Headache(784.0)   . Hypercholesteremia   . Hyperlipidemia   . Hypertension   . Hypertriglyceridemia 2002  . IBS (irritable bowel syndrome)   . Neuropathy   . Seizures ([place])    last sz 01/03/2019    PAST SURGICAL HISTORY: Past Surgical History:  Procedure Laterality Date  . APPENDECTOMY    . BREAST SURGERY  1999   left breast biopsy  . CERVICAL SPINE SURGERY  december 2003  . [place]   for unknown reason.  They thought she had a mass.  . COLONOSCOPY  12/04/2010   Procedure: COLONOSCOPY;  Surgeon: Rogene Houston, MD;  Location: [facility];  Service: Endoscopy;  Laterality: N/A;  3:00  . COLONOSCOPY N/A 01/18/2015    Procedure: COLONOSCOPY;  Surgeon: Rogene Houston, MD;  Location: [facility];  Service: Endoscopy;  Laterality: N/A;  955 - moved to 10:40 - Ann notified pt    FAMILY HISTORY: Family History  Adopted: Yes    SOCIAL HISTORY: Social History   Socioeconomic History  . Marital status: Married    Spouse name: [name]  . Number of children: Not on file  . Years of education: Not on file  . Highest education level: High school graduate  Occupational History    Comment: retired  Tobacco Use  . Smoking status: Former Smoker    Packs/day: 3.00    Years: 20.00    Pack  years: 60.00  . Smokeless tobacco: Never Used  . Tobacco comment: Quit smoking in early 80s after smoking 20 yrs,.  Substance and Sexual Activity  . Alcohol use: Yes    Alcohol/week: 0.0 standard drinks    Comment: very rare occasion on a holiday  . Drug use: No  . Sexual activity: Not Currently  Other Topics Concern  . Not on file  Social History Narrative   Lives with spouse   Caffeine- coffee 4-5 day   Social Determinants of Health   Financial Resource Strain: Not on file  Food Insecurity: Not on file  Transportation Needs: Not on file  Physical Activity: Not on file  Stress: Not on file  Social Connections: Not on file  Intimate Partner Violence: Not on file     PHYSICAL EXAM  GENERAL EXAM/CONSTITUTIONAL: Vitals:  Vitals:   06/11/20 1116  BP: (!) 174/75  Pulse: 69  Weight: 246 lb 12.8 oz (111.9 kg)  Height: 5\' 8"  (1.727 m)   Body mass index is 37.53 kg/m. Wt Readings from Last 3 Encounters:  06/11/20 246 lb 12.8 oz (111.9 kg)  05/02/20 248 lb (112.5 kg)  04/10/20 245 lb 8 oz (111.4 kg)    Patient is in no distress; well developed, nourished and groomed; neck is supple  CARDIOVASCULAR:  Examination of carotid arteries is normal; no carotid bruits  Regular rate and rhythm, no murmurs  Examination of peripheral vascular system by observation and palpation is  normal  EYES:  Ophthalmoscopic exam of optic discs and posterior segments is normal; no papilledema or hemorrhages No exam data present  MUSCULOSKELETAL:  Gait, strength, tone, movements noted in Neurologic exam below  NEUROLOGIC: MENTAL STATUS:  No flowsheet data found.  awake, alert, oriented to person, place and time  recent and remote memory intact  normal attention and concentration  language fluent, comprehension intact, naming intact  fund of knowledge appropriate  CRANIAL NERVE:   2nd - no papilledema on fundoscopic exam  2nd, 3rd, 4th, 6th - pupils equal and reactive to light, visual fields full to confrontation, extraocular muscles intact, no nystagmus  5th - facial sensation symmetric  7th - facial strength symmetric  8th - hearing intact  9th - palate elevates symmetrically, uvula midline  11th - shoulder shrug symmetric  12th - tongue protrusion midline  MOTOR:   normal bulk and tone, full strength in the BUE, BLE  SENSORY:   normal and symmetric to light touch; DECR IN FEET / ANKLES  COORDINATION:   finger-nose-finger, fine finger movements normal  REFLEXES:   deep tendon reflexes TRACE and symmetric; ABSENT AT ANKLES  GAIT/STATION:   narrow based gait; USING SINGLE POINT CANE      DIAGNOSTIC DATA (LABS, IMAGING, TESTING) - I reviewed patient records, labs, notes, testing and imaging myself where available.  Lab Results  Component Value Date   WBC 9.9 01/03/2020   HGB 11.7 01/03/2020   HCT 37.7 01/03/2020   MCV 83 01/03/2020   PLT 405 01/03/2020      Component Value Date/Time   NA 142 01/03/2020 1523   K 4.7 01/03/2020 1523   CL 102 01/03/2020 1523   CO2 26 01/03/2020 1523   GLUCOSE 107 (H) 01/03/2020 1523   GLUCOSE 66 (L) 01/04/2019 1459   BUN 13 01/03/2020 1523   CREATININE 0.84 01/03/2020 1523   CREATININE 0.80 01/17/2014 1001   CALCIUM 9.6 01/03/2020 1523   PROT 6.9 01/03/2020 1523   ALBUMIN 4.2 01/03/2020  1523  AST 25 01/03/2020 1523   ALT 17 01/03/2020 1523   ALKPHOS 70 01/03/2020 1523   BILITOT <0.2 01/03/2020 1523   GFRNONAA 68 01/03/2020 1523   GFRAA 79 01/03/2020 1523   Lab Results  Component Value Date   CHOL 231 (H) 01/06/2016   HDL 53 01/06/2016   LDLCALC 108 (H) 01/06/2016   TRIG 352 (H) 01/06/2016   CHOLHDL 4.4 01/06/2016   Lab Results  Component Value Date   HGBA1C 8.1 01/01/2016   No results found for: VITAMINB12 No results found for: TSH   03/09/18 MRI brain 1. No acute intracranial process or structural cause of seizure identified. 2. Moderate to severe chronic microvascular ischemic changes and moderate volume loss of the brain.   05/10/20 - 05/14/20 VEEG - Interictal: Sharp waves rare left and right temporal , during sleep  - Ictal: No events or seizures.  - Clinical Correlation: The inability to capture events precludes a  definitive diagnosis for the spells. The rare sharp waves seen  during sleep which were poorly formed may indicate a  predisposition towards epilepsy however clinical correlation and  video EEG with spell capture may still be necessary.     ASSESSMENT AND PLAN  75 y.o. year old female here with   Dx:  No diagnosis found.  PLAN:  SEIZURE DISORDER (events in 2019-2022; last 06/10/20) - continue levetiracetam 1500mg  twice a day - continue vimpat 150mg  twice a day; may need to increase to 200mg  twice a day  - may consider adding carbamazepine  - According to [place] law, you can not drive unless you are seizure / syncope free for at least 6 months and under physician's care.   - Please maintain precautions. Do not participate in activities where a loss of awareness could harm you or someone else. No swimming alone, no tub bathing, no hot tubs, no driving, no operating motorized vehicles (cars, ATVs, motocycles, etc), lawnmowers, power tools or firearms. No standing at heights, such as rooftops, ladders or stairs. Avoid hot objects such  as stoves, heaters, open fires. Wear a helmet when riding a bicycle, scooter, skateboard, etc. and avoid areas of traffic. Set your water heater to 120 degrees or less.   NUMBNESS IN FEET (diabetic neuropathy vs lumbar radiculopathy) - follow up PT evaluation - may consider MRI lumbar spine in future   Return in about 6 months (around 12/11/2020).    Penni Bombard, MD 9/92/4268, 34:19 AM Certified in Neurology, Neurophysiology and Neuroimaging  [facility] [address] [place], [place] [address] [phone]

## 2020-06-12 ENCOUNTER — Telehealth: Payer: Self-pay | Admitting: Diagnostic Neuroimaging

## 2020-06-12 NOTE — Telephone Encounter (Signed)
Called patient and advised her vimpat was refilled for 150 mg tabs on 06/10/20. She stated she has a lot of 100 mg tabs,, only 3 of 50 mg tabs. I advised she set those aside and pick up the new Rx for 150 mg tabs ot take instead . She  verbalized understanding, appreciation.

## 2020-06-12 NOTE — Telephone Encounter (Signed)
Pt is requesting a refill for Lacosamide (VIMPAT) 150 MG TABS.  Pharmacy: [facility]

## 2020-06-13 ENCOUNTER — Telehealth: Payer: Self-pay | Admitting: Diagnostic Neuroimaging

## 2020-06-13 DIAGNOSIS — G40909 Epilepsy, unspecified, not intractable, without status epilepticus: Secondary | ICD-10-CM

## 2020-06-13 NOTE — Telephone Encounter (Signed)
I recommend VEEG monitoring at [facility]. If patient agrees, then we can setup. -VRP

## 2020-06-13 NOTE — Telephone Encounter (Signed)
I called pt and pts husband ( ok per dpr) and we discussed Dr. Gladstone Lighter recommendation. Pt and husband were agreeable to referral being placed.  Referral has been placed to [facility] EMU monitoring unit, Dr. Hortense Ramal

## 2020-06-13 NOTE — Telephone Encounter (Signed)
Called and spoke with husband, stated it didn't last long, was really quick, her eyes were open, snoring, gasping for air. I advised will let Dr Leta Baptist know. He verbalized understanding, appreciation.

## 2020-06-13 NOTE — Telephone Encounter (Signed)
Pt called, just had a seizure a few minutes ago. From what my husband told me, I started to sit down in the chair. I yelled oh no, oh no. My eyes was looking straight out to no where, mouth open, almost snoring, sounded like gasping, then started to come out of it and eyes focused on my husband. Would like a call from the nurse.

## 2020-06-27 DIAGNOSIS — M79672 Pain in left foot: Secondary | ICD-10-CM | POA: Diagnosis not present

## 2020-06-27 DIAGNOSIS — E1140 Type 2 diabetes mellitus with diabetic neuropathy, unspecified: Secondary | ICD-10-CM | POA: Diagnosis not present

## 2020-06-27 DIAGNOSIS — L110 Acquired keratosis follicularis: Secondary | ICD-10-CM | POA: Diagnosis not present

## 2020-06-27 DIAGNOSIS — M79671 Pain in right foot: Secondary | ICD-10-CM | POA: Diagnosis not present

## 2020-06-30 DIAGNOSIS — R0781 Pleurodynia: Secondary | ICD-10-CM | POA: Diagnosis not present

## 2020-06-30 DIAGNOSIS — I10 Essential (primary) hypertension: Secondary | ICD-10-CM | POA: Diagnosis not present

## 2020-06-30 DIAGNOSIS — N390 Urinary tract infection, site not specified: Secondary | ICD-10-CM | POA: Diagnosis not present

## 2020-06-30 DIAGNOSIS — E1165 Type 2 diabetes mellitus with hyperglycemia: Secondary | ICD-10-CM | POA: Diagnosis not present

## 2020-07-03 DIAGNOSIS — R262 Difficulty in walking, not elsewhere classified: Secondary | ICD-10-CM | POA: Diagnosis not present

## 2020-07-03 DIAGNOSIS — R569 Unspecified convulsions: Secondary | ICD-10-CM | POA: Diagnosis not present

## 2020-07-03 DIAGNOSIS — R2681 Unsteadiness on feet: Secondary | ICD-10-CM | POA: Diagnosis not present

## 2020-07-03 DIAGNOSIS — Z743 Need for continuous supervision: Secondary | ICD-10-CM | POA: Diagnosis not present

## 2020-07-03 DIAGNOSIS — R531 Weakness: Secondary | ICD-10-CM | POA: Diagnosis not present

## 2020-07-04 DIAGNOSIS — N390 Urinary tract infection, site not specified: Secondary | ICD-10-CM | POA: Diagnosis not present

## 2020-07-04 DIAGNOSIS — R0781 Pleurodynia: Secondary | ICD-10-CM | POA: Diagnosis not present

## 2020-07-05 ENCOUNTER — Other Ambulatory Visit: Payer: Self-pay

## 2020-07-05 ENCOUNTER — Other Ambulatory Visit (HOSPITAL_COMMUNITY): Payer: Self-pay | Admitting: Student

## 2020-07-05 ENCOUNTER — Ambulatory Visit (HOSPITAL_COMMUNITY)
Admission: RE | Admit: 2020-07-05 | Discharge: 2020-07-05 | Disposition: A | Discharge status: Home / Self Care | Payer: Medicare HMO | Source: Ambulatory Visit | Attending: Student | Admitting: Student

## 2020-07-05 DIAGNOSIS — R0781 Pleurodynia: Secondary | ICD-10-CM | POA: Diagnosis not present

## 2020-07-05 DIAGNOSIS — Z87828 Personal history of other (healed) physical injury and trauma: Secondary | ICD-10-CM | POA: Diagnosis not present

## 2020-07-05 DIAGNOSIS — R079 Chest pain, unspecified: Secondary | ICD-10-CM | POA: Diagnosis not present

## 2020-07-15 ENCOUNTER — Telehealth: Payer: Self-pay | Admitting: Diagnostic Neuroimaging

## 2020-07-15 NOTE — Telephone Encounter (Signed)
Called patient, advised Dr Leta Baptist stated to monitor and call for seizures.  Advised her [facility] was contacted to get her scheduled. Patient verbalized understanding, appreciation.

## 2020-07-15 NOTE — Telephone Encounter (Signed)
Pt called, I had a seizure 5/13 at 2:20am and it woke me up. Would like a call from the nurse.

## 2020-07-15 NOTE — Telephone Encounter (Signed)
I called Maria Esparza at Dr. Karolee Stamps office.  She did not receive this referral in April.  She will start working on it today.

## 2020-07-15 NOTE — Telephone Encounter (Signed)
Spoke with patient who stated she was able to go back to sleep after seizure. I asked her if she has appointment with [facility]; she and her husband stated they had no recollection of that conversation.  I advised will send to our referral team to check on it; it was e mailed  to Janace Hoard, Dr Karolee Stamps assistant on 06/13/20. I advised with ask MD if he has any recommendations and call her back Patient verbalized understanding, appreciation.

## 2020-07-17 DIAGNOSIS — R0781 Pleurodynia: Secondary | ICD-10-CM | POA: Diagnosis not present

## 2020-07-17 DIAGNOSIS — H6123 Impacted cerumen, bilateral: Secondary | ICD-10-CM | POA: Diagnosis not present

## 2020-07-30 DIAGNOSIS — E7800 Pure hypercholesterolemia, unspecified: Secondary | ICD-10-CM | POA: Diagnosis not present

## 2020-07-30 DIAGNOSIS — I10 Essential (primary) hypertension: Secondary | ICD-10-CM | POA: Diagnosis not present

## 2020-07-31 DIAGNOSIS — I10 Essential (primary) hypertension: Secondary | ICD-10-CM | POA: Diagnosis not present

## 2020-07-31 DIAGNOSIS — E1165 Type 2 diabetes mellitus with hyperglycemia: Secondary | ICD-10-CM | POA: Diagnosis not present

## 2020-07-31 DIAGNOSIS — D509 Iron deficiency anemia, unspecified: Secondary | ICD-10-CM | POA: Diagnosis not present

## 2020-08-05 ENCOUNTER — Telehealth: Payer: Self-pay | Admitting: *Deleted

## 2020-08-05 NOTE — Telephone Encounter (Signed)
Pt has called back to let [facility]. RN know that she has an appointment  With [facility] at 9:00 tomorrow morning.  This is FYI no call back requested

## 2020-08-05 NOTE — Telephone Encounter (Signed)
Called patient to advise Dr Leta Baptist wants her to increase vimpat to 200 mg twice daily. Her current dose is 150 mg twice daily. She will need a new Rx. She prefers to wait until she sees [facility] MD tomorrow because she has a lot of Vimpat 150 mg tabs, doesn't want to pay unnecessarily for new Rx. She stated she'll call us tomorrow with update, verbalized understanding, appreciation.

## 2020-08-05 NOTE — Telephone Encounter (Signed)
Patient called, stated she just had seizure, eyes were open, looking to the left. She wasn't able to see. Lasted about 1 minute; she thought it was longer. She stated "I can't stand it, what can I do?"  She's not missed any doses of medications, has not been sick. Her appointment for [facility] is 09/10/20. I advised she call them and ask to be on wait list. I advised will let Dr Leta Baptist know and call her back Patient verbalized understanding, appreciation.

## 2020-08-06 DIAGNOSIS — Z79899 Other long term (current) drug therapy: Secondary | ICD-10-CM | POA: Diagnosis not present

## 2020-08-06 DIAGNOSIS — Z7982 Long term (current) use of aspirin: Secondary | ICD-10-CM | POA: Diagnosis not present

## 2020-08-06 DIAGNOSIS — E785 Hyperlipidemia, unspecified: Secondary | ICD-10-CM | POA: Diagnosis not present

## 2020-08-06 DIAGNOSIS — R5383 Other fatigue: Secondary | ICD-10-CM | POA: Diagnosis not present

## 2020-08-06 DIAGNOSIS — R569 Unspecified convulsions: Secondary | ICD-10-CM | POA: Diagnosis not present

## 2020-08-06 DIAGNOSIS — G40909 Epilepsy, unspecified, not intractable, without status epilepticus: Secondary | ICD-10-CM | POA: Diagnosis not present

## 2020-08-06 DIAGNOSIS — R0683 Snoring: Secondary | ICD-10-CM | POA: Diagnosis not present

## 2020-08-06 NOTE — Telephone Encounter (Signed)
Pt is asking for a call from Rogue Jury to discuss what her Doctor advised her about the medication.  Please call.

## 2020-08-06 NOTE — Telephone Encounter (Signed)
Called patient who stated in her appointment today at [facility] neurology she was instructed to continue current keppra dose 1500 mg twice daily, was given a taper up on vimpat dose to 300 mg twice daily, has MRI on 08/26/20, and a referral to sleep clinic. She will call us if any further information, developments.

## 2020-08-07 DIAGNOSIS — E1169 Type 2 diabetes mellitus with other specified complication: Secondary | ICD-10-CM | POA: Diagnosis not present

## 2020-08-07 DIAGNOSIS — G40909 Epilepsy, unspecified, not intractable, without status epilepticus: Secondary | ICD-10-CM | POA: Diagnosis not present

## 2020-08-07 DIAGNOSIS — E1140 Type 2 diabetes mellitus with diabetic neuropathy, unspecified: Secondary | ICD-10-CM | POA: Diagnosis not present

## 2020-08-07 DIAGNOSIS — I10 Essential (primary) hypertension: Secondary | ICD-10-CM | POA: Diagnosis not present

## 2020-08-07 DIAGNOSIS — E875 Hyperkalemia: Secondary | ICD-10-CM | POA: Diagnosis not present

## 2020-08-07 DIAGNOSIS — R809 Proteinuria, unspecified: Secondary | ICD-10-CM | POA: Diagnosis not present

## 2020-08-07 DIAGNOSIS — D509 Iron deficiency anemia, unspecified: Secondary | ICD-10-CM | POA: Diagnosis not present

## 2020-08-07 DIAGNOSIS — E782 Mixed hyperlipidemia: Secondary | ICD-10-CM | POA: Diagnosis not present

## 2020-08-07 DIAGNOSIS — N3281 Overactive bladder: Secondary | ICD-10-CM | POA: Diagnosis not present

## 2020-08-14 ENCOUNTER — Emergency Department (HOSPITAL_COMMUNITY): Payer: Medicare HMO

## 2020-08-14 ENCOUNTER — Other Ambulatory Visit: Payer: Self-pay

## 2020-08-14 ENCOUNTER — Emergency Department (HOSPITAL_COMMUNITY)
Admission: EM | Admit: 2020-08-14 | Discharge: 2020-08-14 | Disposition: A | Discharge status: Home / Self Care | Payer: Medicare HMO | Attending: Emergency Medicine | Admitting: Emergency Medicine

## 2020-08-14 ENCOUNTER — Telehealth: Payer: Self-pay | Admitting: Diagnostic Neuroimaging

## 2020-08-14 ENCOUNTER — Encounter (HOSPITAL_COMMUNITY): Payer: Self-pay

## 2020-08-14 DIAGNOSIS — Z794 Long term (current) use of insulin: Secondary | ICD-10-CM | POA: Diagnosis not present

## 2020-08-14 DIAGNOSIS — Z87891 Personal history of nicotine dependence: Secondary | ICD-10-CM | POA: Insufficient documentation

## 2020-08-14 DIAGNOSIS — R4182 Altered mental status, unspecified: Secondary | ICD-10-CM | POA: Diagnosis not present

## 2020-08-14 DIAGNOSIS — R531 Weakness: Secondary | ICD-10-CM | POA: Diagnosis not present

## 2020-08-14 DIAGNOSIS — I10 Essential (primary) hypertension: Secondary | ICD-10-CM | POA: Diagnosis not present

## 2020-08-14 DIAGNOSIS — E1140 Type 2 diabetes mellitus with diabetic neuropathy, unspecified: Secondary | ICD-10-CM | POA: Diagnosis not present

## 2020-08-14 DIAGNOSIS — T50905A Adverse effect of unspecified drugs, medicaments and biological substances, initial encounter: Secondary | ICD-10-CM | POA: Diagnosis not present

## 2020-08-14 DIAGNOSIS — Z7984 Long term (current) use of oral hypoglycemic drugs: Secondary | ICD-10-CM | POA: Insufficient documentation

## 2020-08-14 DIAGNOSIS — G319 Degenerative disease of nervous system, unspecified: Secondary | ICD-10-CM | POA: Diagnosis not present

## 2020-08-14 DIAGNOSIS — Z7982 Long term (current) use of aspirin: Secondary | ICD-10-CM | POA: Diagnosis not present

## 2020-08-14 DIAGNOSIS — J45909 Unspecified asthma, uncomplicated: Secondary | ICD-10-CM | POA: Diagnosis not present

## 2020-08-14 DIAGNOSIS — Z79899 Other long term (current) drug therapy: Secondary | ICD-10-CM | POA: Insufficient documentation

## 2020-08-14 DIAGNOSIS — J439 Emphysema, unspecified: Secondary | ICD-10-CM | POA: Diagnosis not present

## 2020-08-14 DIAGNOSIS — R569 Unspecified convulsions: Secondary | ICD-10-CM | POA: Diagnosis not present

## 2020-08-14 DIAGNOSIS — R29898 Other symptoms and signs involving the musculoskeletal system: Secondary | ICD-10-CM | POA: Diagnosis not present

## 2020-08-14 LAB — URINALYSIS, ROUTINE W REFLEX MICROSCOPIC
Bilirubin Urine: NEGATIVE
Glucose, UA: NEGATIVE mg/dL
Hgb urine dipstick: NEGATIVE
Ketones, ur: NEGATIVE mg/dL
Leukocytes,Ua: NEGATIVE
Nitrite: NEGATIVE
Protein, ur: NEGATIVE mg/dL
Specific Gravity, Urine: 1.005 (ref 1.005–1.030)
pH: 6 (ref 5.0–8.0)

## 2020-08-14 LAB — CBC WITH DIFFERENTIAL/PLATELET
Abs Immature Granulocytes: 0.02 10*3/uL (ref 0.00–0.07)
Basophils Absolute: 0.1 10*3/uL (ref 0.0–0.1)
Basophils Relative: 1 %
Eosinophils Absolute: 0.3 10*3/uL (ref 0.0–0.5)
Eosinophils Relative: 3 %
HCT: 37.6 % (ref 36.0–46.0)
Hemoglobin: 11.6 g/dL — ABNORMAL LOW (ref 12.0–15.0)
Immature Granulocytes: 0 %
Lymphocytes Relative: 31 %
Lymphs Abs: 2.9 10*3/uL (ref 0.7–4.0)
MCH: 25.8 pg — ABNORMAL LOW (ref 26.0–34.0)
MCHC: 30.9 g/dL (ref 30.0–36.0)
MCV: 83.7 fL (ref 80.0–100.0)
Monocytes Absolute: 0.7 10*3/uL (ref 0.1–1.0)
Monocytes Relative: 7 %
Neutro Abs: 5.5 10*3/uL (ref 1.7–7.7)
Neutrophils Relative %: 58 %
Platelets: 342 10*3/uL (ref 150–400)
RBC: 4.49 MIL/uL (ref 3.87–5.11)
RDW: 17.2 % — ABNORMAL HIGH (ref 11.5–15.5)
WBC: 9.5 10*3/uL (ref 4.0–10.5)
nRBC: 0 % (ref 0.0–0.2)

## 2020-08-14 LAB — COMPREHENSIVE METABOLIC PANEL
ALT: 20 U/L (ref 0–44)
AST: 24 U/L (ref 15–41)
Albumin: 3.8 g/dL (ref 3.5–5.0)
Alkaline Phosphatase: 57 U/L (ref 38–126)
Anion gap: 7 (ref 5–15)
BUN: 17 mg/dL (ref 8–23)
CO2: 27 mmol/L (ref 22–32)
Calcium: 9.2 mg/dL (ref 8.9–10.3)
Chloride: 103 mmol/L (ref 98–111)
Creatinine, Ser: 0.83 mg/dL (ref 0.44–1.00)
GFR, Estimated: >60 mL/min (ref >60)
Glucose, Bld: 103 mg/dL — ABNORMAL HIGH (ref 70–99)
Potassium: 4.2 mmol/L (ref 3.5–5.1)
Sodium: 137 mmol/L (ref 135–145)
Total Bilirubin: 0.3 mg/dL (ref 0.3–1.2)
Total Protein: 7.3 g/dL (ref 6.5–8.1)

## 2020-08-14 LAB — CBG MONITORING, ED: Glucose-Capillary: 102 mg/dL — ABNORMAL HIGH (ref 70–99)

## 2020-08-14 MED ORDER — SODIUM CHLORIDE 0.9 % IV BOLUS
1000.0000 mL | Freq: Once | INTRAVENOUS | Status: AC
  Administered 2020-08-14: 1000 mL via INTRAVENOUS

## 2020-08-14 NOTE — Telephone Encounter (Signed)
FYI: Pt called, can not stand up or walk. Will be taken to the hospital by son and grandson. Wanted Verneita Griffes to know.

## 2020-08-14 NOTE — Telephone Encounter (Signed)
Called patient to check on her, husband stated ED MD thought her weakness may have been reaction to vimpat. She's had trouble since doubling dose to 300 mg twice a day. She has fallen, has severe diarrhea. He  tried to call [place] this morning unsuccessfully. ED MD decreased Vimpat to 150 mg in am, 300 mg at night. I advised he call [facility] neurologist until he reaches someone to let them know about her symptoms and ED visit and new Vimpat dose. He verbalized understanding, appreciation.

## 2020-08-14 NOTE — ED Provider Notes (Addendum)
[place] Provider Note   CSN: [id number] Arrival date & time: 08/14/20  1157     History Chief Complaint  Patient presents with   Weakness    Maria Esparza is a 75 y.o. female.  Pt presents to the Ed today with weakness.  The pt said she was sitting on the bed trying to help his husband read his medicine bottles.  She felt like she could not see very well.  She felt like her legs were like water.  She said she was able to walk with a lot of assistance.  Pt has a hx of seizure d/o.  Her neurologist has been titrating her up on Vimpat.  She was on 150 mg bid.  She was increased to 150 mg qam and 300 mg qhs for 1 week (last week) and is now 300 mg bid (for the past week).   She tolerated the 150 and 300 Pt denies having a seizure today.  Pt denies any f/c.  Her neurologist has scheduled her for a MRI for later this month.       Past Medical History:  Diagnosis Date   Anemia    Arthritis    Asthma    mild   Cough    Diabetes mellitus type 2, uncomplicated (HCC)    x 10 yrs   Diabetic neuropathy ([place]) rt foot   Headache(784.0)    Hypercholesteremia    Hyperlipidemia    Hypertension    Hypertriglyceridemia 2002   IBS (irritable bowel syndrome)    Neuropathy    Seizures ([place])    last sz 01/03/2019    Patient Active Problem List   Diagnosis Date Noted   Cholelithiasis 04/10/2020   Sepsis from Urinary Source 10/30/2018   Acute pyelonephritis 10/30/2018   DM (diabetes mellitus) ([place]) 10/30/2018   Diarrhea 03/31/2012   Hypertension    Hypercholesteremia    Hyperlipidemia     Past Surgical History:  Procedure Laterality Date   APPENDECTOMY     BREAST SURGERY  1999   left breast biopsy   CERVICAL SPINE SURGERY  december 2003   [place]   for unknown reason.  They thought she had a mass.   COLONOSCOPY  12/04/2010   Procedure: COLONOSCOPY;  Surgeon: Rogene Houston, MD;  [facility];  Service: Endoscopy;  Laterality:  N/A;  3:00   COLONOSCOPY N/A 01/18/2015   Procedure: COLONOSCOPY;  Surgeon: Rogene Houston, MD;  Location: [facility];  Service: Endoscopy;  Laterality: N/A;  955 - moved to 10:40 - Ann notified pt     OB History   No obstetric history on file.     Family History  Adopted: Yes    Social History   Tobacco Use   Smoking status: Former    Packs/day: 3.00    Years: 20.00    Pack years: 60.00    Types: Cigarettes   Smokeless tobacco: Never   Tobacco comments:    Quit smoking in early 57s after smoking 20 yrs,.  Vaping Use   Vaping Use: Never used  Substance Use Topics   Alcohol use: Yes    Alcohol/week: 0.0 standard drinks    Comment: very rare occasion on a holiday   Drug use: No    Home Medications Prior to Admission medications   Medication Sig Start Date End Date Taking? Authorizing Provider  aspirin EC 81 MG tablet Take 1 tablet (81 mg total) by mouth daily  with breakfast. 11/01/18   Roxan Hockey, MD  calcium carbonate (OS-CAL) 600 MG TABS Take 600 mg by mouth daily with breakfast. With Vitamin D    [provider]  fish oil-omega-3 fatty acids 1000 MG capsule Take 1 g by mouth daily.    [provider]  Insulin Degludec 100 UNIT/ML SOLN Inject 40 Units into the skin daily. Takes 42 units    [provider]  Lacosamide (VIMPAT) 150 MG TABS Take 1 tablet (150 mg total) by mouth in the morning and at bedtime. 06/10/20   Penumalli, Earlean Polka, MD  levETIRAcetam (KEPPRA) 500 MG tablet TAKE THREE TABLETS BY MOUTH EVERY MORNING and TAKE THREE TABLETS BY MOUTH EVERY EVENING 04/15/20   Lomax, Amy, NP  meloxicam (MOBIC) 15 MG tablet Take 15 mg by mouth daily. Patient not taking: Reported on 06/11/2020 04/30/20   [provider]  metFORMIN (GLUCOPHAGE) 500 MG tablet 1 TABLET BY MOUTH TWICE DAILY WITH MEALS Patient taking differently: Takes 2 TABLET BY MOUTH TWICE DAILY WITH MEALS 02/18/16   Mikey Kirschner, MD  Multiple Vitamins-Minerals  (CENTRUM SILVER PO) Take by mouth.    [provider]  oxybutynin (DITROPAN-XL) 5 MG 24 hr tablet Take by mouth. 04/11/20   [provider]  pioglitazone (ACTOS) 30 MG tablet Take 30 mg by mouth daily. 04/11/20   [provider]  Probiotic Product (PROBIOTIC DAILY PO) Take by mouth. Patient takes one by mouth at bedtime.    [provider]  rosuvastatin (CRESTOR) 20 MG tablet Take 20 mg by mouth at bedtime. 04/11/20   [provider]  tiZANidine (ZANAFLEX) 2 MG tablet Take 2 mg by mouth 2 (two) times daily. PRN Patient not taking: Reported on 06/11/2020 03/08/20   [provider]    Allergies    Pedi-pre tape spray [wound dressing adhesive], Penicillins, and Prinivil [lisinopril]  Review of Systems   Review of Systems  Neurological:  Positive for weakness.   Physical Exam Updated Vital Signs BP (!) 169/72   Pulse 73   Temp 98 F (36.7 C) (Oral)   Resp 16   Ht 5\' 8"  (1.727 m)   Wt 108.9 kg   SpO2 100%   BMI 36.49 kg/m   Physical Exam Vitals and nursing note reviewed.  Constitutional:      Appearance: Normal appearance.  HENT:     Head: Normocephalic and atraumatic.     Right Ear: External ear normal.     Left Ear: External ear normal.     Nose: Nose normal.     Mouth/Throat:     Mouth: Mucous membranes are moist.     Pharynx: Oropharynx is clear.  Eyes:     Extraocular Movements: Extraocular movements intact.     Conjunctiva/sclera: Conjunctivae normal.     Pupils: Pupils are equal, round, and reactive to light.  Cardiovascular:     Rate and Rhythm: Normal rate and regular rhythm.     Pulses: Normal pulses.     Heart sounds: Normal heart sounds.  Pulmonary:     Effort: Pulmonary effort is normal.     Breath sounds: Normal breath sounds.  Abdominal:     General: Abdomen is flat. Bowel sounds are normal.     Palpations: Abdomen is soft.  Musculoskeletal:        General: Normal range of motion.     Cervical back:  Normal range of motion and neck supple.  Skin:    General: Skin is warm.  Capillary Refill: Capillary refill takes less than 2 seconds.  Neurological:     General: No focal deficit present.     Mental Status: She is alert and oriented to person, place, and time.     Comments: Generalized weakness  Psychiatric:        Mood and Affect: Mood normal.        Behavior: Behavior normal.        Thought Content: Thought content normal.        Judgment: Judgment normal.    ED Results / Procedures / Treatments   Labs (all labs ordered are listed, but only abnormal results are displayed) Labs Reviewed  CBC WITH DIFFERENTIAL/PLATELET - Abnormal; Notable for the following components:      Result Value   Hemoglobin 11.6 (*)    MCH 25.8 (*)    RDW 17.2 (*)    All other components within normal limits  COMPREHENSIVE METABOLIC PANEL - Abnormal; Notable for the following components:   Glucose, Bld 103 (*)    All other components within normal limits  URINALYSIS, ROUTINE W REFLEX MICROSCOPIC - Abnormal; Notable for the following components:   Color, Urine STRAW (*)    All other components within normal limits  CBG MONITORING, ED - Abnormal; Notable for the following components:   Glucose-Capillary 102 (*)    All other components within normal limits    EKG EKG Interpretation  Date/Time:  Wednesday August 14 2020 12:14:45 EDT Ventricular Rate:  73 PR Interval:  218 QRS Duration: 175 QT Interval:  468 QTC Calculation: 516 R Axis:   -52 Text Interpretation: Sinus rhythm Borderline prolonged PR interval Consider left atrial enlargement Left bundle branch block Baseline wander in lead(s) II III aVF Since last ECG, rate has slowed Confirmed by Gareth Morgan [phone]) on 08/15/2020 11:02:43 AM  Radiology No results found.  Procedures Procedures   Medications Ordered in ED Medications  sodium chloride 0.9 % bolus 1,000 mL (0 mLs Intravenous Stopped 08/14/20 1457)    ED Course  I have  reviewed the triage vital signs and the nursing notes.  Pertinent labs & imaging results that were available during my care of the patient were reviewed by me and considered in my medical decision making (see chart for details).    MDM Rules/Calculators/A&P                          Labs nl.  MRI nl.  Pt is feeling better after fluids.  She is able to eat and to drink.  I am going to have her decrease her Vimpat dose back to 150 mg qam and 300 mg qhs as she tolerated that well.  She is to f/u with her neurologist.  Return if worse.  Final Clinical Impression(s) / ED Diagnoses Final diagnoses:  Weakness  Medication reaction, initial encounter    Rx / DC Orders ED Discharge Orders     None        Isla Pence, MD 08/14/20 1438    Isla Pence, MD 08/29/20 1430

## 2020-08-14 NOTE — ED Triage Notes (Signed)
Pt to er, pt states that she is here for some leg weakness and vision problems, pt states that this am she was sitting on the edge of the bed and kept falling over, states that she was having a hard time focusing/ seeing.  States that she has been having seizures, but she doesn't think that this was a seizure.  Denies unilateral weakness, pt states that she feels confused, states that she has felt off all morning long, states that her last known normal was last night.

## 2020-08-14 NOTE — ED Notes (Signed)
Pt's legs felt weak while standing for orthostatics.

## 2020-08-14 NOTE — Discharge Instructions (Signed)
Go back to 150 mg in the morning and 300 mg at night of Vimpat.

## 2020-08-14 NOTE — Telephone Encounter (Signed)
Noted, will follow up later.

## 2020-08-14 NOTE — ED Notes (Signed)
Patient transported to MRI 

## 2020-08-16 DIAGNOSIS — G40909 Epilepsy, unspecified, not intractable, without status epilepticus: Secondary | ICD-10-CM | POA: Diagnosis not present

## 2020-08-29 DIAGNOSIS — E7800 Pure hypercholesterolemia, unspecified: Secondary | ICD-10-CM | POA: Diagnosis not present

## 2020-08-29 DIAGNOSIS — I10 Essential (primary) hypertension: Secondary | ICD-10-CM | POA: Diagnosis not present

## 2020-08-29 DIAGNOSIS — G40909 Epilepsy, unspecified, not intractable, without status epilepticus: Secondary | ICD-10-CM | POA: Diagnosis not present

## 2020-09-16 DIAGNOSIS — M79671 Pain in right foot: Secondary | ICD-10-CM | POA: Diagnosis not present

## 2020-09-16 DIAGNOSIS — H353131 Nonexudative age-related macular degeneration, bilateral, early dry stage: Secondary | ICD-10-CM | POA: Diagnosis not present

## 2020-09-16 DIAGNOSIS — L110 Acquired keratosis follicularis: Secondary | ICD-10-CM | POA: Diagnosis not present

## 2020-09-16 DIAGNOSIS — E1140 Type 2 diabetes mellitus with diabetic neuropathy, unspecified: Secondary | ICD-10-CM | POA: Diagnosis not present

## 2020-09-16 DIAGNOSIS — Z961 Presence of intraocular lens: Secondary | ICD-10-CM | POA: Diagnosis not present

## 2020-09-16 DIAGNOSIS — E119 Type 2 diabetes mellitus without complications: Secondary | ICD-10-CM | POA: Diagnosis not present

## 2020-09-16 DIAGNOSIS — M79672 Pain in left foot: Secondary | ICD-10-CM | POA: Diagnosis not present

## 2020-09-29 DIAGNOSIS — E7800 Pure hypercholesterolemia, unspecified: Secondary | ICD-10-CM | POA: Diagnosis not present

## 2020-09-29 DIAGNOSIS — I10 Essential (primary) hypertension: Secondary | ICD-10-CM | POA: Diagnosis not present

## 2020-10-18 DIAGNOSIS — G40909 Epilepsy, unspecified, not intractable, without status epilepticus: Secondary | ICD-10-CM | POA: Diagnosis not present

## 2020-10-29 DIAGNOSIS — R569 Unspecified convulsions: Secondary | ICD-10-CM | POA: Diagnosis not present

## 2020-10-30 DIAGNOSIS — I10 Essential (primary) hypertension: Secondary | ICD-10-CM | POA: Diagnosis not present

## 2020-10-30 DIAGNOSIS — E1165 Type 2 diabetes mellitus with hyperglycemia: Secondary | ICD-10-CM | POA: Diagnosis not present

## 2020-10-30 DIAGNOSIS — E119 Type 2 diabetes mellitus without complications: Secondary | ICD-10-CM | POA: Diagnosis not present

## 2020-11-05 DIAGNOSIS — E1165 Type 2 diabetes mellitus with hyperglycemia: Secondary | ICD-10-CM | POA: Diagnosis not present

## 2020-11-05 DIAGNOSIS — I10 Essential (primary) hypertension: Secondary | ICD-10-CM | POA: Diagnosis not present

## 2020-11-05 DIAGNOSIS — N189 Chronic kidney disease, unspecified: Secondary | ICD-10-CM | POA: Diagnosis not present

## 2020-11-13 DIAGNOSIS — I10 Essential (primary) hypertension: Secondary | ICD-10-CM | POA: Diagnosis not present

## 2020-11-13 DIAGNOSIS — N3281 Overactive bladder: Secondary | ICD-10-CM | POA: Diagnosis not present

## 2020-11-13 DIAGNOSIS — Z23 Encounter for immunization: Secondary | ICD-10-CM | POA: Diagnosis not present

## 2020-11-13 DIAGNOSIS — R809 Proteinuria, unspecified: Secondary | ICD-10-CM | POA: Diagnosis not present

## 2020-11-13 DIAGNOSIS — G40909 Epilepsy, unspecified, not intractable, without status epilepticus: Secondary | ICD-10-CM | POA: Diagnosis not present

## 2020-11-13 DIAGNOSIS — E875 Hyperkalemia: Secondary | ICD-10-CM | POA: Diagnosis not present

## 2020-11-13 DIAGNOSIS — D509 Iron deficiency anemia, unspecified: Secondary | ICD-10-CM | POA: Diagnosis not present

## 2020-11-13 DIAGNOSIS — Z0001 Encounter for general adult medical examination with abnormal findings: Secondary | ICD-10-CM | POA: Diagnosis not present

## 2020-11-13 DIAGNOSIS — E1169 Type 2 diabetes mellitus with other specified complication: Secondary | ICD-10-CM | POA: Diagnosis not present

## 2020-11-25 DIAGNOSIS — L110 Acquired keratosis follicularis: Secondary | ICD-10-CM | POA: Diagnosis not present

## 2020-11-25 DIAGNOSIS — M79671 Pain in right foot: Secondary | ICD-10-CM | POA: Diagnosis not present

## 2020-11-25 DIAGNOSIS — E1140 Type 2 diabetes mellitus with diabetic neuropathy, unspecified: Secondary | ICD-10-CM | POA: Diagnosis not present

## 2020-11-25 DIAGNOSIS — M79672 Pain in left foot: Secondary | ICD-10-CM | POA: Diagnosis not present

## 2020-11-29 DIAGNOSIS — I10 Essential (primary) hypertension: Secondary | ICD-10-CM | POA: Diagnosis not present

## 2020-11-29 DIAGNOSIS — E119 Type 2 diabetes mellitus without complications: Secondary | ICD-10-CM | POA: Diagnosis not present

## 2020-11-29 DIAGNOSIS — E1165 Type 2 diabetes mellitus with hyperglycemia: Secondary | ICD-10-CM | POA: Diagnosis not present

## 2020-12-11 ENCOUNTER — Ambulatory Visit: Payer: Medicare HMO | Admitting: Diagnostic Neuroimaging

## 2020-12-17 ENCOUNTER — Ambulatory Visit: Payer: Medicare HMO | Admitting: Diagnostic Neuroimaging

## 2020-12-30 DIAGNOSIS — E782 Mixed hyperlipidemia: Secondary | ICD-10-CM | POA: Diagnosis not present

## 2020-12-30 DIAGNOSIS — I10 Essential (primary) hypertension: Secondary | ICD-10-CM | POA: Diagnosis not present

## 2020-12-30 DIAGNOSIS — E119 Type 2 diabetes mellitus without complications: Secondary | ICD-10-CM | POA: Diagnosis not present

## 2020-12-30 DIAGNOSIS — E1165 Type 2 diabetes mellitus with hyperglycemia: Secondary | ICD-10-CM | POA: Diagnosis not present

## 2021-01-20 DIAGNOSIS — L821 Other seborrheic keratosis: Secondary | ICD-10-CM | POA: Diagnosis not present

## 2021-01-21 DIAGNOSIS — S60417A Abrasion of left little finger, initial encounter: Secondary | ICD-10-CM | POA: Diagnosis not present

## 2021-01-21 DIAGNOSIS — S60457A Superficial foreign body of left little finger, initial encounter: Secondary | ICD-10-CM | POA: Diagnosis not present

## 2021-01-27 ENCOUNTER — Other Ambulatory Visit (HOSPITAL_COMMUNITY): Payer: Self-pay | Admitting: Internal Medicine

## 2021-01-27 DIAGNOSIS — Z1231 Encounter for screening mammogram for malignant neoplasm of breast: Secondary | ICD-10-CM

## 2021-01-29 DIAGNOSIS — E119 Type 2 diabetes mellitus without complications: Secondary | ICD-10-CM | POA: Diagnosis not present

## 2021-01-29 DIAGNOSIS — E1165 Type 2 diabetes mellitus with hyperglycemia: Secondary | ICD-10-CM | POA: Diagnosis not present

## 2021-01-29 DIAGNOSIS — I10 Essential (primary) hypertension: Secondary | ICD-10-CM | POA: Diagnosis not present

## 2021-01-30 ENCOUNTER — Other Ambulatory Visit: Payer: Self-pay

## 2021-01-30 ENCOUNTER — Ambulatory Visit (HOSPITAL_COMMUNITY)
Admission: RE | Admit: 2021-01-30 | Discharge: 2021-01-30 | Disposition: A | Discharge status: Home / Self Care | Payer: Medicare HMO | Source: Ambulatory Visit | Attending: Internal Medicine | Admitting: Internal Medicine

## 2021-01-30 DIAGNOSIS — Z1231 Encounter for screening mammogram for malignant neoplasm of breast: Secondary | ICD-10-CM | POA: Diagnosis not present

## 2021-02-10 DIAGNOSIS — E1140 Type 2 diabetes mellitus with diabetic neuropathy, unspecified: Secondary | ICD-10-CM | POA: Diagnosis not present

## 2021-02-10 DIAGNOSIS — M79672 Pain in left foot: Secondary | ICD-10-CM | POA: Diagnosis not present

## 2021-02-10 DIAGNOSIS — L110 Acquired keratosis follicularis: Secondary | ICD-10-CM | POA: Diagnosis not present

## 2021-02-10 DIAGNOSIS — M79671 Pain in right foot: Secondary | ICD-10-CM | POA: Diagnosis not present

## 2021-02-20 DIAGNOSIS — R569 Unspecified convulsions: Secondary | ICD-10-CM | POA: Diagnosis not present

## 2021-02-28 DIAGNOSIS — E1165 Type 2 diabetes mellitus with hyperglycemia: Secondary | ICD-10-CM | POA: Diagnosis not present

## 2021-02-28 DIAGNOSIS — I10 Essential (primary) hypertension: Secondary | ICD-10-CM | POA: Diagnosis not present

## 2021-02-28 DIAGNOSIS — E119 Type 2 diabetes mellitus without complications: Secondary | ICD-10-CM | POA: Diagnosis not present

## 2021-03-05 DIAGNOSIS — E782 Mixed hyperlipidemia: Secondary | ICD-10-CM | POA: Diagnosis not present

## 2021-03-05 DIAGNOSIS — E1169 Type 2 diabetes mellitus with other specified complication: Secondary | ICD-10-CM | POA: Diagnosis not present

## 2021-03-21 DIAGNOSIS — G40909 Epilepsy, unspecified, not intractable, without status epilepticus: Secondary | ICD-10-CM | POA: Diagnosis not present

## 2021-03-30 DIAGNOSIS — E785 Hyperlipidemia, unspecified: Secondary | ICD-10-CM | POA: Diagnosis not present

## 2021-03-30 DIAGNOSIS — I10 Essential (primary) hypertension: Secondary | ICD-10-CM | POA: Diagnosis not present

## 2021-04-01 DIAGNOSIS — E785 Hyperlipidemia, unspecified: Secondary | ICD-10-CM | POA: Diagnosis not present

## 2021-04-01 DIAGNOSIS — I10 Essential (primary) hypertension: Secondary | ICD-10-CM | POA: Diagnosis not present

## 2021-04-01 DIAGNOSIS — E1165 Type 2 diabetes mellitus with hyperglycemia: Secondary | ICD-10-CM | POA: Diagnosis not present

## 2021-04-11 DIAGNOSIS — E1169 Type 2 diabetes mellitus with other specified complication: Secondary | ICD-10-CM | POA: Diagnosis not present

## 2021-04-11 DIAGNOSIS — M545 Low back pain, unspecified: Secondary | ICD-10-CM | POA: Diagnosis not present

## 2021-04-14 DIAGNOSIS — M9905 Segmental and somatic dysfunction of pelvic region: Secondary | ICD-10-CM | POA: Diagnosis not present

## 2021-04-14 DIAGNOSIS — M9902 Segmental and somatic dysfunction of thoracic region: Secondary | ICD-10-CM | POA: Diagnosis not present

## 2021-04-14 DIAGNOSIS — I10 Essential (primary) hypertension: Secondary | ICD-10-CM | POA: Diagnosis not present

## 2021-04-14 DIAGNOSIS — M9903 Segmental and somatic dysfunction of lumbar region: Secondary | ICD-10-CM | POA: Diagnosis not present

## 2021-04-14 DIAGNOSIS — M62830 Muscle spasm of back: Secondary | ICD-10-CM | POA: Diagnosis not present

## 2021-04-18 DIAGNOSIS — M9903 Segmental and somatic dysfunction of lumbar region: Secondary | ICD-10-CM | POA: Diagnosis not present

## 2021-04-18 DIAGNOSIS — M62830 Muscle spasm of back: Secondary | ICD-10-CM | POA: Diagnosis not present

## 2021-04-18 DIAGNOSIS — M9902 Segmental and somatic dysfunction of thoracic region: Secondary | ICD-10-CM | POA: Diagnosis not present

## 2021-04-18 DIAGNOSIS — M9905 Segmental and somatic dysfunction of pelvic region: Secondary | ICD-10-CM | POA: Diagnosis not present

## 2021-04-21 DIAGNOSIS — E1140 Type 2 diabetes mellitus with diabetic neuropathy, unspecified: Secondary | ICD-10-CM | POA: Diagnosis not present

## 2021-04-21 DIAGNOSIS — M79672 Pain in left foot: Secondary | ICD-10-CM | POA: Diagnosis not present

## 2021-04-21 DIAGNOSIS — L110 Acquired keratosis follicularis: Secondary | ICD-10-CM | POA: Diagnosis not present

## 2021-04-21 DIAGNOSIS — M79671 Pain in right foot: Secondary | ICD-10-CM | POA: Diagnosis not present

## 2021-04-22 DIAGNOSIS — M9902 Segmental and somatic dysfunction of thoracic region: Secondary | ICD-10-CM | POA: Diagnosis not present

## 2021-04-22 DIAGNOSIS — M9905 Segmental and somatic dysfunction of pelvic region: Secondary | ICD-10-CM | POA: Diagnosis not present

## 2021-04-22 DIAGNOSIS — M9903 Segmental and somatic dysfunction of lumbar region: Secondary | ICD-10-CM | POA: Diagnosis not present

## 2021-04-22 DIAGNOSIS — M62830 Muscle spasm of back: Secondary | ICD-10-CM | POA: Diagnosis not present

## 2021-04-25 DIAGNOSIS — M9902 Segmental and somatic dysfunction of thoracic region: Secondary | ICD-10-CM | POA: Diagnosis not present

## 2021-04-25 DIAGNOSIS — M9903 Segmental and somatic dysfunction of lumbar region: Secondary | ICD-10-CM | POA: Diagnosis not present

## 2021-04-25 DIAGNOSIS — M62830 Muscle spasm of back: Secondary | ICD-10-CM | POA: Diagnosis not present

## 2021-04-25 DIAGNOSIS — M9905 Segmental and somatic dysfunction of pelvic region: Secondary | ICD-10-CM | POA: Diagnosis not present

## 2021-04-28 ENCOUNTER — Other Ambulatory Visit: Payer: Self-pay

## 2021-04-28 DIAGNOSIS — G40909 Epilepsy, unspecified, not intractable, without status epilepticus: Secondary | ICD-10-CM

## 2021-04-28 MED ORDER — LEVETIRACETAM 500 MG PO TABS: ORAL_TABLET | ORAL | 0 refills | Status: AC

## 2021-04-29 DIAGNOSIS — I10 Essential (primary) hypertension: Secondary | ICD-10-CM | POA: Diagnosis not present

## 2021-04-29 DIAGNOSIS — M9903 Segmental and somatic dysfunction of lumbar region: Secondary | ICD-10-CM | POA: Diagnosis not present

## 2021-04-29 DIAGNOSIS — M9905 Segmental and somatic dysfunction of pelvic region: Secondary | ICD-10-CM | POA: Diagnosis not present

## 2021-04-29 DIAGNOSIS — M9902 Segmental and somatic dysfunction of thoracic region: Secondary | ICD-10-CM | POA: Diagnosis not present

## 2021-04-29 DIAGNOSIS — E119 Type 2 diabetes mellitus without complications: Secondary | ICD-10-CM | POA: Diagnosis not present

## 2021-04-29 DIAGNOSIS — M62830 Muscle spasm of back: Secondary | ICD-10-CM | POA: Diagnosis not present

## 2021-04-29 DIAGNOSIS — E1165 Type 2 diabetes mellitus with hyperglycemia: Secondary | ICD-10-CM | POA: Diagnosis not present

## 2021-05-02 DIAGNOSIS — M9902 Segmental and somatic dysfunction of thoracic region: Secondary | ICD-10-CM | POA: Diagnosis not present

## 2021-05-02 DIAGNOSIS — M9905 Segmental and somatic dysfunction of pelvic region: Secondary | ICD-10-CM | POA: Diagnosis not present

## 2021-05-02 DIAGNOSIS — M62830 Muscle spasm of back: Secondary | ICD-10-CM | POA: Diagnosis not present

## 2021-05-02 DIAGNOSIS — M9903 Segmental and somatic dysfunction of lumbar region: Secondary | ICD-10-CM | POA: Diagnosis not present

## 2021-05-08 DIAGNOSIS — Z961 Presence of intraocular lens: Secondary | ICD-10-CM | POA: Diagnosis not present

## 2021-05-08 DIAGNOSIS — M62830 Muscle spasm of back: Secondary | ICD-10-CM | POA: Diagnosis not present

## 2021-05-08 DIAGNOSIS — M9905 Segmental and somatic dysfunction of pelvic region: Secondary | ICD-10-CM | POA: Diagnosis not present

## 2021-05-08 DIAGNOSIS — M9902 Segmental and somatic dysfunction of thoracic region: Secondary | ICD-10-CM | POA: Diagnosis not present

## 2021-05-08 DIAGNOSIS — E119 Type 2 diabetes mellitus without complications: Secondary | ICD-10-CM | POA: Diagnosis not present

## 2021-05-08 DIAGNOSIS — M9903 Segmental and somatic dysfunction of lumbar region: Secondary | ICD-10-CM | POA: Diagnosis not present

## 2021-05-08 DIAGNOSIS — H353131 Nonexudative age-related macular degeneration, bilateral, early dry stage: Secondary | ICD-10-CM | POA: Diagnosis not present

## 2021-05-08 DIAGNOSIS — H04123 Dry eye syndrome of bilateral lacrimal glands: Secondary | ICD-10-CM | POA: Diagnosis not present

## 2021-05-16 DIAGNOSIS — M62830 Muscle spasm of back: Secondary | ICD-10-CM | POA: Diagnosis not present

## 2021-05-16 DIAGNOSIS — M9902 Segmental and somatic dysfunction of thoracic region: Secondary | ICD-10-CM | POA: Diagnosis not present

## 2021-05-16 DIAGNOSIS — M9903 Segmental and somatic dysfunction of lumbar region: Secondary | ICD-10-CM | POA: Diagnosis not present

## 2021-05-16 DIAGNOSIS — M9905 Segmental and somatic dysfunction of pelvic region: Secondary | ICD-10-CM | POA: Diagnosis not present

## 2021-05-21 DIAGNOSIS — M62830 Muscle spasm of back: Secondary | ICD-10-CM | POA: Diagnosis not present

## 2021-05-21 DIAGNOSIS — M9903 Segmental and somatic dysfunction of lumbar region: Secondary | ICD-10-CM | POA: Diagnosis not present

## 2021-05-21 DIAGNOSIS — M9905 Segmental and somatic dysfunction of pelvic region: Secondary | ICD-10-CM | POA: Diagnosis not present

## 2021-05-21 DIAGNOSIS — M9902 Segmental and somatic dysfunction of thoracic region: Secondary | ICD-10-CM | POA: Diagnosis not present

## 2021-05-30 DIAGNOSIS — M62830 Muscle spasm of back: Secondary | ICD-10-CM | POA: Diagnosis not present

## 2021-05-30 DIAGNOSIS — M9903 Segmental and somatic dysfunction of lumbar region: Secondary | ICD-10-CM | POA: Diagnosis not present

## 2021-05-30 DIAGNOSIS — M9902 Segmental and somatic dysfunction of thoracic region: Secondary | ICD-10-CM | POA: Diagnosis not present

## 2021-05-30 DIAGNOSIS — M9905 Segmental and somatic dysfunction of pelvic region: Secondary | ICD-10-CM | POA: Diagnosis not present

## 2021-05-30 DIAGNOSIS — E1165 Type 2 diabetes mellitus with hyperglycemia: Secondary | ICD-10-CM | POA: Diagnosis not present

## 2021-05-30 DIAGNOSIS — I10 Essential (primary) hypertension: Secondary | ICD-10-CM | POA: Diagnosis not present

## 2021-05-30 DIAGNOSIS — E119 Type 2 diabetes mellitus without complications: Secondary | ICD-10-CM | POA: Diagnosis not present

## 2021-06-20 DIAGNOSIS — M9902 Segmental and somatic dysfunction of thoracic region: Secondary | ICD-10-CM | POA: Diagnosis not present

## 2021-06-20 DIAGNOSIS — M9905 Segmental and somatic dysfunction of pelvic region: Secondary | ICD-10-CM | POA: Diagnosis not present

## 2021-06-20 DIAGNOSIS — M9903 Segmental and somatic dysfunction of lumbar region: Secondary | ICD-10-CM | POA: Diagnosis not present

## 2021-06-20 DIAGNOSIS — M62830 Muscle spasm of back: Secondary | ICD-10-CM | POA: Diagnosis not present

## 2021-06-26 DIAGNOSIS — R569 Unspecified convulsions: Secondary | ICD-10-CM | POA: Diagnosis not present

## 2021-06-29 DIAGNOSIS — N182 Chronic kidney disease, stage 2 (mild): Secondary | ICD-10-CM | POA: Diagnosis not present

## 2021-06-29 DIAGNOSIS — E1165 Type 2 diabetes mellitus with hyperglycemia: Secondary | ICD-10-CM | POA: Diagnosis not present

## 2021-06-29 DIAGNOSIS — E782 Mixed hyperlipidemia: Secondary | ICD-10-CM | POA: Diagnosis not present

## 2021-06-29 DIAGNOSIS — I129 Hypertensive chronic kidney disease with stage 1 through stage 4 chronic kidney disease, or unspecified chronic kidney disease: Secondary | ICD-10-CM | POA: Diagnosis not present

## 2021-07-04 DIAGNOSIS — M9903 Segmental and somatic dysfunction of lumbar region: Secondary | ICD-10-CM | POA: Diagnosis not present

## 2021-07-04 DIAGNOSIS — M62830 Muscle spasm of back: Secondary | ICD-10-CM | POA: Diagnosis not present

## 2021-07-04 DIAGNOSIS — M9905 Segmental and somatic dysfunction of pelvic region: Secondary | ICD-10-CM | POA: Diagnosis not present

## 2021-07-04 DIAGNOSIS — M9902 Segmental and somatic dysfunction of thoracic region: Secondary | ICD-10-CM | POA: Diagnosis not present

## 2021-07-07 DIAGNOSIS — M79671 Pain in right foot: Secondary | ICD-10-CM | POA: Diagnosis not present

## 2021-07-07 DIAGNOSIS — M79672 Pain in left foot: Secondary | ICD-10-CM | POA: Diagnosis not present

## 2021-07-07 DIAGNOSIS — L110 Acquired keratosis follicularis: Secondary | ICD-10-CM | POA: Diagnosis not present

## 2021-07-07 DIAGNOSIS — E1140 Type 2 diabetes mellitus with diabetic neuropathy, unspecified: Secondary | ICD-10-CM | POA: Diagnosis not present

## 2021-07-10 DIAGNOSIS — E1169 Type 2 diabetes mellitus with other specified complication: Secondary | ICD-10-CM | POA: Diagnosis not present

## 2021-07-10 DIAGNOSIS — E782 Mixed hyperlipidemia: Secondary | ICD-10-CM | POA: Diagnosis not present

## 2021-07-18 DIAGNOSIS — M9903 Segmental and somatic dysfunction of lumbar region: Secondary | ICD-10-CM | POA: Diagnosis not present

## 2021-07-18 DIAGNOSIS — M9905 Segmental and somatic dysfunction of pelvic region: Secondary | ICD-10-CM | POA: Diagnosis not present

## 2021-07-18 DIAGNOSIS — E875 Hyperkalemia: Secondary | ICD-10-CM | POA: Diagnosis not present

## 2021-07-18 DIAGNOSIS — N182 Chronic kidney disease, stage 2 (mild): Secondary | ICD-10-CM | POA: Diagnosis not present

## 2021-07-18 DIAGNOSIS — E1169 Type 2 diabetes mellitus with other specified complication: Secondary | ICD-10-CM | POA: Diagnosis not present

## 2021-07-18 DIAGNOSIS — R809 Proteinuria, unspecified: Secondary | ICD-10-CM | POA: Diagnosis not present

## 2021-07-18 DIAGNOSIS — M62830 Muscle spasm of back: Secondary | ICD-10-CM | POA: Diagnosis not present

## 2021-07-18 DIAGNOSIS — E782 Mixed hyperlipidemia: Secondary | ICD-10-CM | POA: Diagnosis not present

## 2021-07-18 DIAGNOSIS — L989 Disorder of the skin and subcutaneous tissue, unspecified: Secondary | ICD-10-CM | POA: Diagnosis not present

## 2021-07-18 DIAGNOSIS — E1140 Type 2 diabetes mellitus with diabetic neuropathy, unspecified: Secondary | ICD-10-CM | POA: Diagnosis not present

## 2021-07-18 DIAGNOSIS — M9902 Segmental and somatic dysfunction of thoracic region: Secondary | ICD-10-CM | POA: Diagnosis not present

## 2021-07-18 DIAGNOSIS — G40909 Epilepsy, unspecified, not intractable, without status epilepticus: Secondary | ICD-10-CM | POA: Diagnosis not present

## 2021-07-18 DIAGNOSIS — I10 Essential (primary) hypertension: Secondary | ICD-10-CM | POA: Diagnosis not present

## 2021-07-18 DIAGNOSIS — N3281 Overactive bladder: Secondary | ICD-10-CM | POA: Diagnosis not present

## 2021-07-18 DIAGNOSIS — D509 Iron deficiency anemia, unspecified: Secondary | ICD-10-CM | POA: Diagnosis not present

## 2021-07-21 DIAGNOSIS — M79671 Pain in right foot: Secondary | ICD-10-CM | POA: Diagnosis not present

## 2021-07-21 DIAGNOSIS — G5790 Unspecified mononeuropathy of unspecified lower limb: Secondary | ICD-10-CM | POA: Diagnosis not present

## 2021-07-21 DIAGNOSIS — M792 Neuralgia and neuritis, unspecified: Secondary | ICD-10-CM | POA: Diagnosis not present

## 2021-07-21 DIAGNOSIS — M79672 Pain in left foot: Secondary | ICD-10-CM | POA: Diagnosis not present

## 2021-07-23 DIAGNOSIS — E1165 Type 2 diabetes mellitus with hyperglycemia: Secondary | ICD-10-CM | POA: Diagnosis not present

## 2021-07-30 DIAGNOSIS — E782 Mixed hyperlipidemia: Secondary | ICD-10-CM | POA: Diagnosis not present

## 2021-07-30 DIAGNOSIS — N182 Chronic kidney disease, stage 2 (mild): Secondary | ICD-10-CM | POA: Diagnosis not present

## 2021-07-30 DIAGNOSIS — E1165 Type 2 diabetes mellitus with hyperglycemia: Secondary | ICD-10-CM | POA: Diagnosis not present

## 2021-07-30 DIAGNOSIS — I129 Hypertensive chronic kidney disease with stage 1 through stage 4 chronic kidney disease, or unspecified chronic kidney disease: Secondary | ICD-10-CM | POA: Diagnosis not present

## 2021-08-01 DIAGNOSIS — M9903 Segmental and somatic dysfunction of lumbar region: Secondary | ICD-10-CM | POA: Diagnosis not present

## 2021-08-01 DIAGNOSIS — M9902 Segmental and somatic dysfunction of thoracic region: Secondary | ICD-10-CM | POA: Diagnosis not present

## 2021-08-01 DIAGNOSIS — M62830 Muscle spasm of back: Secondary | ICD-10-CM | POA: Diagnosis not present

## 2021-08-01 DIAGNOSIS — M9905 Segmental and somatic dysfunction of pelvic region: Secondary | ICD-10-CM | POA: Diagnosis not present

## 2021-08-11 DIAGNOSIS — M79671 Pain in right foot: Secondary | ICD-10-CM | POA: Diagnosis not present

## 2021-08-11 DIAGNOSIS — M79672 Pain in left foot: Secondary | ICD-10-CM | POA: Diagnosis not present

## 2021-08-11 DIAGNOSIS — M792 Neuralgia and neuritis, unspecified: Secondary | ICD-10-CM | POA: Diagnosis not present

## 2021-08-15 DIAGNOSIS — M9905 Segmental and somatic dysfunction of pelvic region: Secondary | ICD-10-CM | POA: Diagnosis not present

## 2021-08-15 DIAGNOSIS — M62830 Muscle spasm of back: Secondary | ICD-10-CM | POA: Diagnosis not present

## 2021-08-15 DIAGNOSIS — M9903 Segmental and somatic dysfunction of lumbar region: Secondary | ICD-10-CM | POA: Diagnosis not present

## 2021-08-15 DIAGNOSIS — M9902 Segmental and somatic dysfunction of thoracic region: Secondary | ICD-10-CM | POA: Diagnosis not present

## 2021-08-23 DIAGNOSIS — E1165 Type 2 diabetes mellitus with hyperglycemia: Secondary | ICD-10-CM | POA: Diagnosis not present

## 2021-08-25 ENCOUNTER — Other Ambulatory Visit (HOSPITAL_COMMUNITY): Payer: Self-pay | Admitting: Sports Medicine

## 2021-08-25 ENCOUNTER — Other Ambulatory Visit: Payer: Self-pay | Admitting: Sports Medicine

## 2021-08-25 DIAGNOSIS — M5136 Other intervertebral disc degeneration, lumbar region: Secondary | ICD-10-CM | POA: Diagnosis not present

## 2021-08-25 DIAGNOSIS — M4727 Other spondylosis with radiculopathy, lumbosacral region: Secondary | ICD-10-CM

## 2021-08-25 DIAGNOSIS — M25551 Pain in right hip: Secondary | ICD-10-CM | POA: Diagnosis not present

## 2021-08-25 DIAGNOSIS — M47816 Spondylosis without myelopathy or radiculopathy, lumbar region: Secondary | ICD-10-CM | POA: Diagnosis not present

## 2021-08-25 DIAGNOSIS — M5416 Radiculopathy, lumbar region: Secondary | ICD-10-CM | POA: Diagnosis not present

## 2021-08-29 DIAGNOSIS — G40909 Epilepsy, unspecified, not intractable, without status epilepticus: Secondary | ICD-10-CM | POA: Diagnosis not present

## 2021-08-29 DIAGNOSIS — N3281 Overactive bladder: Secondary | ICD-10-CM | POA: Diagnosis not present

## 2021-08-29 DIAGNOSIS — E875 Hyperkalemia: Secondary | ICD-10-CM | POA: Diagnosis not present

## 2021-08-29 DIAGNOSIS — N182 Chronic kidney disease, stage 2 (mild): Secondary | ICD-10-CM | POA: Diagnosis not present

## 2021-08-29 DIAGNOSIS — E1169 Type 2 diabetes mellitus with other specified complication: Secondary | ICD-10-CM | POA: Diagnosis not present

## 2021-08-29 DIAGNOSIS — M5136 Other intervertebral disc degeneration, lumbar region: Secondary | ICD-10-CM | POA: Diagnosis not present

## 2021-08-29 DIAGNOSIS — Z6841 Body Mass Index (BMI) 40.0 and over, adult: Secondary | ICD-10-CM | POA: Diagnosis not present

## 2021-09-11 ENCOUNTER — Ambulatory Visit (HOSPITAL_COMMUNITY)
Admission: RE | Admit: 2021-09-11 | Discharge: 2021-09-11 | Disposition: A | Discharge status: Home / Self Care | Payer: Medicare HMO | Source: Ambulatory Visit | Attending: Sports Medicine | Admitting: Sports Medicine

## 2021-09-11 DIAGNOSIS — M5136 Other intervertebral disc degeneration, lumbar region: Secondary | ICD-10-CM | POA: Insufficient documentation

## 2021-09-11 DIAGNOSIS — M2578 Osteophyte, vertebrae: Secondary | ICD-10-CM | POA: Diagnosis not present

## 2021-09-11 DIAGNOSIS — M51369 Other intervertebral disc degeneration, lumbar region without mention of lumbar back pain or lower extremity pain: Secondary | ICD-10-CM

## 2021-09-11 DIAGNOSIS — M4727 Other spondylosis with radiculopathy, lumbosacral region: Secondary | ICD-10-CM | POA: Diagnosis not present

## 2021-09-11 DIAGNOSIS — M5126 Other intervertebral disc displacement, lumbar region: Secondary | ICD-10-CM | POA: Diagnosis not present

## 2021-09-11 DIAGNOSIS — M48061 Spinal stenosis, lumbar region without neurogenic claudication: Secondary | ICD-10-CM | POA: Diagnosis not present

## 2021-09-15 DIAGNOSIS — M79672 Pain in left foot: Secondary | ICD-10-CM | POA: Diagnosis not present

## 2021-09-15 DIAGNOSIS — M792 Neuralgia and neuritis, unspecified: Secondary | ICD-10-CM | POA: Diagnosis not present

## 2021-09-15 DIAGNOSIS — M79671 Pain in right foot: Secondary | ICD-10-CM | POA: Diagnosis not present

## 2021-09-15 DIAGNOSIS — L110 Acquired keratosis follicularis: Secondary | ICD-10-CM | POA: Diagnosis not present

## 2021-09-17 DIAGNOSIS — E119 Type 2 diabetes mellitus without complications: Secondary | ICD-10-CM | POA: Diagnosis not present

## 2021-09-17 DIAGNOSIS — Z0100 Encounter for examination of eyes and vision without abnormal findings: Secondary | ICD-10-CM | POA: Diagnosis not present

## 2021-09-17 DIAGNOSIS — H04123 Dry eye syndrome of bilateral lacrimal glands: Secondary | ICD-10-CM | POA: Diagnosis not present

## 2021-09-17 DIAGNOSIS — Z961 Presence of intraocular lens: Secondary | ICD-10-CM | POA: Diagnosis not present

## 2021-09-17 DIAGNOSIS — H5213 Myopia, bilateral: Secondary | ICD-10-CM | POA: Diagnosis not present

## 2021-09-17 DIAGNOSIS — H353131 Nonexudative age-related macular degeneration, bilateral, early dry stage: Secondary | ICD-10-CM | POA: Diagnosis not present

## 2021-09-17 DIAGNOSIS — H524 Presbyopia: Secondary | ICD-10-CM | POA: Diagnosis not present

## 2021-09-22 DIAGNOSIS — E1165 Type 2 diabetes mellitus with hyperglycemia: Secondary | ICD-10-CM | POA: Diagnosis not present

## 2021-09-26 DIAGNOSIS — M48061 Spinal stenosis, lumbar region without neurogenic claudication: Secondary | ICD-10-CM | POA: Diagnosis not present

## 2021-09-26 DIAGNOSIS — M5416 Radiculopathy, lumbar region: Secondary | ICD-10-CM | POA: Diagnosis not present

## 2021-09-29 DIAGNOSIS — M79671 Pain in right foot: Secondary | ICD-10-CM | POA: Diagnosis not present

## 2021-09-29 DIAGNOSIS — M79672 Pain in left foot: Secondary | ICD-10-CM | POA: Diagnosis not present

## 2021-09-29 DIAGNOSIS — L110 Acquired keratosis follicularis: Secondary | ICD-10-CM | POA: Diagnosis not present

## 2021-09-29 DIAGNOSIS — E1140 Type 2 diabetes mellitus with diabetic neuropathy, unspecified: Secondary | ICD-10-CM | POA: Diagnosis not present

## 2021-10-13 DIAGNOSIS — M79672 Pain in left foot: Secondary | ICD-10-CM | POA: Diagnosis not present

## 2021-10-13 DIAGNOSIS — M79671 Pain in right foot: Secondary | ICD-10-CM | POA: Diagnosis not present

## 2021-10-13 DIAGNOSIS — E1140 Type 2 diabetes mellitus with diabetic neuropathy, unspecified: Secondary | ICD-10-CM | POA: Diagnosis not present

## 2021-10-13 DIAGNOSIS — M25579 Pain in unspecified ankle and joints of unspecified foot: Secondary | ICD-10-CM | POA: Diagnosis not present

## 2021-10-21 DIAGNOSIS — R569 Unspecified convulsions: Secondary | ICD-10-CM | POA: Diagnosis not present

## 2021-10-23 DIAGNOSIS — M47816 Spondylosis without myelopathy or radiculopathy, lumbar region: Secondary | ICD-10-CM | POA: Diagnosis not present

## 2021-10-23 DIAGNOSIS — E1165 Type 2 diabetes mellitus with hyperglycemia: Secondary | ICD-10-CM | POA: Diagnosis not present

## 2021-11-06 DIAGNOSIS — E1169 Type 2 diabetes mellitus with other specified complication: Secondary | ICD-10-CM | POA: Diagnosis not present

## 2021-11-06 DIAGNOSIS — E782 Mixed hyperlipidemia: Secondary | ICD-10-CM | POA: Diagnosis not present

## 2021-11-10 DIAGNOSIS — M79672 Pain in left foot: Secondary | ICD-10-CM | POA: Diagnosis not present

## 2021-11-10 DIAGNOSIS — M79671 Pain in right foot: Secondary | ICD-10-CM | POA: Diagnosis not present

## 2021-11-10 DIAGNOSIS — M792 Neuralgia and neuritis, unspecified: Secondary | ICD-10-CM | POA: Diagnosis not present

## 2021-11-10 DIAGNOSIS — L110 Acquired keratosis follicularis: Secondary | ICD-10-CM | POA: Diagnosis not present

## 2021-11-12 DIAGNOSIS — E782 Mixed hyperlipidemia: Secondary | ICD-10-CM | POA: Diagnosis not present

## 2021-11-12 DIAGNOSIS — N3281 Overactive bladder: Secondary | ICD-10-CM | POA: Diagnosis not present

## 2021-11-12 DIAGNOSIS — I10 Essential (primary) hypertension: Secondary | ICD-10-CM | POA: Diagnosis not present

## 2021-11-12 DIAGNOSIS — D509 Iron deficiency anemia, unspecified: Secondary | ICD-10-CM | POA: Diagnosis not present

## 2021-11-12 DIAGNOSIS — E875 Hyperkalemia: Secondary | ICD-10-CM | POA: Diagnosis not present

## 2021-11-12 DIAGNOSIS — G40909 Epilepsy, unspecified, not intractable, without status epilepticus: Secondary | ICD-10-CM | POA: Diagnosis not present

## 2021-11-12 DIAGNOSIS — Z0001 Encounter for general adult medical examination with abnormal findings: Secondary | ICD-10-CM | POA: Diagnosis not present

## 2021-11-12 DIAGNOSIS — Z23 Encounter for immunization: Secondary | ICD-10-CM | POA: Diagnosis not present

## 2021-11-12 DIAGNOSIS — R809 Proteinuria, unspecified: Secondary | ICD-10-CM | POA: Diagnosis not present

## 2021-11-12 DIAGNOSIS — E1369 Other specified diabetes mellitus with other specified complication: Secondary | ICD-10-CM | POA: Diagnosis not present

## 2021-11-12 DIAGNOSIS — N182 Chronic kidney disease, stage 2 (mild): Secondary | ICD-10-CM | POA: Diagnosis not present

## 2021-11-23 DIAGNOSIS — E1165 Type 2 diabetes mellitus with hyperglycemia: Secondary | ICD-10-CM | POA: Diagnosis not present

## 2021-11-26 DIAGNOSIS — M47816 Spondylosis without myelopathy or radiculopathy, lumbar region: Secondary | ICD-10-CM | POA: Diagnosis not present

## 2021-12-08 DIAGNOSIS — M79672 Pain in left foot: Secondary | ICD-10-CM | POA: Diagnosis not present

## 2021-12-08 DIAGNOSIS — E1140 Type 2 diabetes mellitus with diabetic neuropathy, unspecified: Secondary | ICD-10-CM | POA: Diagnosis not present

## 2021-12-08 DIAGNOSIS — M79671 Pain in right foot: Secondary | ICD-10-CM | POA: Diagnosis not present

## 2021-12-08 DIAGNOSIS — L110 Acquired keratosis follicularis: Secondary | ICD-10-CM | POA: Diagnosis not present

## 2021-12-11 DIAGNOSIS — M47816 Spondylosis without myelopathy or radiculopathy, lumbar region: Secondary | ICD-10-CM | POA: Diagnosis not present

## 2021-12-15 DIAGNOSIS — M79672 Pain in left foot: Secondary | ICD-10-CM | POA: Diagnosis not present

## 2021-12-15 DIAGNOSIS — M792 Neuralgia and neuritis, unspecified: Secondary | ICD-10-CM | POA: Diagnosis not present

## 2021-12-15 DIAGNOSIS — M79671 Pain in right foot: Secondary | ICD-10-CM | POA: Diagnosis not present

## 2021-12-15 DIAGNOSIS — E1140 Type 2 diabetes mellitus with diabetic neuropathy, unspecified: Secondary | ICD-10-CM | POA: Diagnosis not present

## 2021-12-23 ENCOUNTER — Encounter (INDEPENDENT_AMBULATORY_CARE_PROVIDER_SITE_OTHER): Payer: Self-pay | Admitting: *Deleted

## 2021-12-23 DIAGNOSIS — E1165 Type 2 diabetes mellitus with hyperglycemia: Secondary | ICD-10-CM | POA: Diagnosis not present

## 2021-12-30 ENCOUNTER — Other Ambulatory Visit (HOSPITAL_COMMUNITY): Payer: Self-pay | Admitting: Internal Medicine

## 2021-12-30 DIAGNOSIS — Z1231 Encounter for screening mammogram for malignant neoplasm of breast: Secondary | ICD-10-CM

## 2021-12-31 DIAGNOSIS — R569 Unspecified convulsions: Secondary | ICD-10-CM | POA: Diagnosis not present

## 2022-01-06 DIAGNOSIS — G4710 Hypersomnia, unspecified: Secondary | ICD-10-CM | POA: Diagnosis not present

## 2022-01-06 DIAGNOSIS — E1369 Other specified diabetes mellitus with other specified complication: Secondary | ICD-10-CM | POA: Diagnosis not present

## 2022-01-06 DIAGNOSIS — G40909 Epilepsy, unspecified, not intractable, without status epilepticus: Secondary | ICD-10-CM | POA: Diagnosis not present

## 2022-01-15 DIAGNOSIS — M79672 Pain in left foot: Secondary | ICD-10-CM | POA: Diagnosis not present

## 2022-01-15 DIAGNOSIS — M792 Neuralgia and neuritis, unspecified: Secondary | ICD-10-CM | POA: Diagnosis not present

## 2022-01-15 DIAGNOSIS — E1140 Type 2 diabetes mellitus with diabetic neuropathy, unspecified: Secondary | ICD-10-CM | POA: Diagnosis not present

## 2022-01-15 DIAGNOSIS — M79671 Pain in right foot: Secondary | ICD-10-CM | POA: Diagnosis not present

## 2022-01-23 DIAGNOSIS — E1165 Type 2 diabetes mellitus with hyperglycemia: Secondary | ICD-10-CM | POA: Diagnosis not present

## 2022-02-02 ENCOUNTER — Ambulatory Visit (HOSPITAL_COMMUNITY)
Admission: RE | Admit: 2022-02-02 | Discharge: 2022-02-02 | Disposition: A | Discharge status: Home / Self Care | Payer: Medicare HMO | Source: Ambulatory Visit | Attending: Internal Medicine | Admitting: Internal Medicine

## 2022-02-02 DIAGNOSIS — Z1231 Encounter for screening mammogram for malignant neoplasm of breast: Secondary | ICD-10-CM | POA: Diagnosis not present

## 2022-02-03 DIAGNOSIS — E860 Dehydration: Secondary | ICD-10-CM | POA: Diagnosis not present

## 2022-02-09 DIAGNOSIS — Z6838 Body mass index (BMI) 38.0-38.9, adult: Secondary | ICD-10-CM | POA: Diagnosis not present

## 2022-02-09 DIAGNOSIS — M48061 Spinal stenosis, lumbar region without neurogenic claudication: Secondary | ICD-10-CM | POA: Diagnosis not present

## 2022-02-09 DIAGNOSIS — M47816 Spondylosis without myelopathy or radiculopathy, lumbar region: Secondary | ICD-10-CM | POA: Diagnosis not present

## 2022-02-10 DIAGNOSIS — E782 Mixed hyperlipidemia: Secondary | ICD-10-CM | POA: Diagnosis not present

## 2022-02-10 DIAGNOSIS — E1369 Other specified diabetes mellitus with other specified complication: Secondary | ICD-10-CM | POA: Diagnosis not present

## 2022-02-16 DIAGNOSIS — E1140 Type 2 diabetes mellitus with diabetic neuropathy, unspecified: Secondary | ICD-10-CM | POA: Diagnosis not present

## 2022-02-16 DIAGNOSIS — L110 Acquired keratosis follicularis: Secondary | ICD-10-CM | POA: Diagnosis not present

## 2022-02-16 DIAGNOSIS — M79671 Pain in right foot: Secondary | ICD-10-CM | POA: Diagnosis not present

## 2022-02-16 DIAGNOSIS — M79672 Pain in left foot: Secondary | ICD-10-CM | POA: Diagnosis not present

## 2022-02-17 DIAGNOSIS — M792 Neuralgia and neuritis, unspecified: Secondary | ICD-10-CM | POA: Diagnosis not present

## 2022-02-17 DIAGNOSIS — M79671 Pain in right foot: Secondary | ICD-10-CM | POA: Diagnosis not present

## 2022-02-17 DIAGNOSIS — M79672 Pain in left foot: Secondary | ICD-10-CM | POA: Diagnosis not present

## 2022-02-17 DIAGNOSIS — E1140 Type 2 diabetes mellitus with diabetic neuropathy, unspecified: Secondary | ICD-10-CM | POA: Diagnosis not present

## 2022-02-19 DIAGNOSIS — N3281 Overactive bladder: Secondary | ICD-10-CM | POA: Diagnosis not present

## 2022-02-19 DIAGNOSIS — N182 Chronic kidney disease, stage 2 (mild): Secondary | ICD-10-CM | POA: Diagnosis not present

## 2022-02-19 DIAGNOSIS — E1122 Type 2 diabetes mellitus with diabetic chronic kidney disease: Secondary | ICD-10-CM | POA: Diagnosis not present

## 2022-02-19 DIAGNOSIS — I129 Hypertensive chronic kidney disease with stage 1 through stage 4 chronic kidney disease, or unspecified chronic kidney disease: Secondary | ICD-10-CM | POA: Diagnosis not present

## 2022-02-19 DIAGNOSIS — E1142 Type 2 diabetes mellitus with diabetic polyneuropathy: Secondary | ICD-10-CM | POA: Diagnosis not present

## 2022-02-19 DIAGNOSIS — D509 Iron deficiency anemia, unspecified: Secondary | ICD-10-CM | POA: Diagnosis not present

## 2022-02-19 DIAGNOSIS — R809 Proteinuria, unspecified: Secondary | ICD-10-CM | POA: Diagnosis not present

## 2022-02-19 DIAGNOSIS — E875 Hyperkalemia: Secondary | ICD-10-CM | POA: Diagnosis not present

## 2022-02-19 DIAGNOSIS — G40909 Epilepsy, unspecified, not intractable, without status epilepticus: Secondary | ICD-10-CM | POA: Diagnosis not present

## 2022-02-19 DIAGNOSIS — G4710 Hypersomnia, unspecified: Secondary | ICD-10-CM | POA: Diagnosis not present

## 2022-02-19 DIAGNOSIS — E1169 Type 2 diabetes mellitus with other specified complication: Secondary | ICD-10-CM | POA: Diagnosis not present

## 2022-02-22 DIAGNOSIS — E1165 Type 2 diabetes mellitus with hyperglycemia: Secondary | ICD-10-CM | POA: Diagnosis not present

## 2022-02-24 DIAGNOSIS — M7062 Trochanteric bursitis, left hip: Secondary | ICD-10-CM | POA: Diagnosis not present

## 2022-03-17 DIAGNOSIS — M792 Neuralgia and neuritis, unspecified: Secondary | ICD-10-CM | POA: Diagnosis not present

## 2022-03-17 DIAGNOSIS — E1140 Type 2 diabetes mellitus with diabetic neuropathy, unspecified: Secondary | ICD-10-CM | POA: Diagnosis not present

## 2022-03-17 DIAGNOSIS — M79671 Pain in right foot: Secondary | ICD-10-CM | POA: Diagnosis not present

## 2022-03-17 DIAGNOSIS — M79672 Pain in left foot: Secondary | ICD-10-CM | POA: Diagnosis not present

## 2022-03-25 DIAGNOSIS — M5136 Other intervertebral disc degeneration, lumbar region: Secondary | ICD-10-CM | POA: Diagnosis not present

## 2022-03-25 DIAGNOSIS — M5137 Other intervertebral disc degeneration, lumbosacral region: Secondary | ICD-10-CM | POA: Diagnosis not present

## 2022-03-25 DIAGNOSIS — E1165 Type 2 diabetes mellitus with hyperglycemia: Secondary | ICD-10-CM | POA: Diagnosis not present

## 2022-03-25 DIAGNOSIS — M4316 Spondylolisthesis, lumbar region: Secondary | ICD-10-CM | POA: Diagnosis not present

## 2022-03-25 DIAGNOSIS — M419 Scoliosis, unspecified: Secondary | ICD-10-CM | POA: Diagnosis not present

## 2022-03-25 DIAGNOSIS — M545 Low back pain, unspecified: Secondary | ICD-10-CM | POA: Diagnosis not present

## 2022-03-25 DIAGNOSIS — M9973 Connective tissue and disc stenosis of intervertebral foramina of lumbar region: Secondary | ICD-10-CM | POA: Diagnosis not present

## 2022-03-25 DIAGNOSIS — G8929 Other chronic pain: Secondary | ICD-10-CM | POA: Diagnosis not present

## 2022-03-25 DIAGNOSIS — M47816 Spondylosis without myelopathy or radiculopathy, lumbar region: Secondary | ICD-10-CM | POA: Diagnosis not present

## 2022-03-30 ENCOUNTER — Other Ambulatory Visit (HOSPITAL_COMMUNITY): Payer: Self-pay | Admitting: Internal Medicine

## 2022-03-30 DIAGNOSIS — Z13820 Encounter for screening for osteoporosis: Secondary | ICD-10-CM

## 2022-04-06 ENCOUNTER — Other Ambulatory Visit (HOSPITAL_COMMUNITY): Payer: Medicare HMO

## 2022-04-06 ENCOUNTER — Ambulatory Visit (HOSPITAL_COMMUNITY)
Admission: RE | Admit: 2022-04-06 | Discharge: 2022-04-06 | Disposition: A | Discharge status: Home / Self Care | Payer: Medicare HMO | Source: Ambulatory Visit | Attending: Internal Medicine | Admitting: Internal Medicine

## 2022-04-06 DIAGNOSIS — Z780 Asymptomatic menopausal state: Secondary | ICD-10-CM | POA: Diagnosis not present

## 2022-04-06 DIAGNOSIS — Z794 Long term (current) use of insulin: Secondary | ICD-10-CM | POA: Insufficient documentation

## 2022-04-06 DIAGNOSIS — E119 Type 2 diabetes mellitus without complications: Secondary | ICD-10-CM | POA: Diagnosis not present

## 2022-04-06 DIAGNOSIS — Z13820 Encounter for screening for osteoporosis: Secondary | ICD-10-CM | POA: Insufficient documentation

## 2022-04-16 DIAGNOSIS — M79671 Pain in right foot: Secondary | ICD-10-CM | POA: Diagnosis not present

## 2022-04-16 DIAGNOSIS — M792 Neuralgia and neuritis, unspecified: Secondary | ICD-10-CM | POA: Diagnosis not present

## 2022-04-16 DIAGNOSIS — E1140 Type 2 diabetes mellitus with diabetic neuropathy, unspecified: Secondary | ICD-10-CM | POA: Diagnosis not present

## 2022-04-16 DIAGNOSIS — M79672 Pain in left foot: Secondary | ICD-10-CM | POA: Diagnosis not present

## 2022-04-21 DIAGNOSIS — M9943 Connective tissue stenosis of neural canal of lumbar region: Secondary | ICD-10-CM | POA: Diagnosis not present

## 2022-04-23 ENCOUNTER — Encounter: Payer: Self-pay | Admitting: Physician Assistant

## 2022-04-23 DIAGNOSIS — M9943 Connective tissue stenosis of neural canal of lumbar region: Secondary | ICD-10-CM

## 2022-04-24 ENCOUNTER — Other Ambulatory Visit: Payer: Self-pay | Admitting: Physician Assistant

## 2022-04-24 DIAGNOSIS — M9943 Connective tissue stenosis of neural canal of lumbar region: Secondary | ICD-10-CM

## 2022-04-25 DIAGNOSIS — E1165 Type 2 diabetes mellitus with hyperglycemia: Secondary | ICD-10-CM | POA: Diagnosis not present

## 2022-05-01 DIAGNOSIS — R0981 Nasal congestion: Secondary | ICD-10-CM | POA: Diagnosis not present

## 2022-05-01 DIAGNOSIS — H6123 Impacted cerumen, bilateral: Secondary | ICD-10-CM | POA: Diagnosis not present

## 2022-05-01 DIAGNOSIS — R0600 Dyspnea, unspecified: Secondary | ICD-10-CM | POA: Diagnosis not present

## 2022-05-01 DIAGNOSIS — J069 Acute upper respiratory infection, unspecified: Secondary | ICD-10-CM | POA: Diagnosis not present

## 2022-05-01 DIAGNOSIS — R059 Cough, unspecified: Secondary | ICD-10-CM | POA: Diagnosis not present

## 2022-05-08 ENCOUNTER — Ambulatory Visit
Admission: RE | Admit: 2022-05-08 | Discharge: 2022-05-08 | Disposition: A | Discharge status: Home / Self Care | Payer: Medicare HMO | Source: Ambulatory Visit | Attending: Physician Assistant | Admitting: Physician Assistant

## 2022-05-08 DIAGNOSIS — M2578 Osteophyte, vertebrae: Secondary | ICD-10-CM | POA: Diagnosis not present

## 2022-05-08 DIAGNOSIS — M5126 Other intervertebral disc displacement, lumbar region: Secondary | ICD-10-CM | POA: Diagnosis not present

## 2022-05-08 DIAGNOSIS — M9943 Connective tissue stenosis of neural canal of lumbar region: Secondary | ICD-10-CM

## 2022-05-08 DIAGNOSIS — M48061 Spinal stenosis, lumbar region without neurogenic claudication: Secondary | ICD-10-CM | POA: Diagnosis not present

## 2022-05-08 MED ORDER — MEPERIDINE HCL 50 MG/ML IJ SOLN: 50.0000 mg | Freq: Once | INTRAMUSCULAR | Status: DC | PRN

## 2022-05-08 MED ORDER — DIAZEPAM 5 MG PO TABS
5.0000 mg | ORAL_TABLET | Freq: Once | ORAL | Status: AC
  Administered 2022-05-08: 5 mg via ORAL

## 2022-05-08 MED ORDER — ONDANSETRON HCL 4 MG/2ML IJ SOLN: 4.0000 mg | Freq: Once | INTRAMUSCULAR | Status: DC | PRN

## 2022-05-08 MED ORDER — IOPAMIDOL (ISOVUE-M 200) INJECTION 41%
20.0000 mL | Freq: Once | INTRAMUSCULAR | Status: AC
  Administered 2022-05-08: 20 mL via INTRATHECAL

## 2022-05-08 NOTE — Discharge Instructions (Signed)

## 2022-05-14 DIAGNOSIS — M79672 Pain in left foot: Secondary | ICD-10-CM | POA: Diagnosis not present

## 2022-05-14 DIAGNOSIS — M79671 Pain in right foot: Secondary | ICD-10-CM | POA: Diagnosis not present

## 2022-05-14 DIAGNOSIS — E1140 Type 2 diabetes mellitus with diabetic neuropathy, unspecified: Secondary | ICD-10-CM | POA: Diagnosis not present

## 2022-05-14 DIAGNOSIS — M792 Neuralgia and neuritis, unspecified: Secondary | ICD-10-CM | POA: Diagnosis not present

## 2022-05-17 IMAGING — DX DG CHEST 2V
2 series · 2 of 2 positions shown · non-contrast
Comparison: Radiographs 07/05/2020 and 08/31/2019.  CT 10/30/2018.

CLINICAL DATA: Altered mental status. Possible seizure. History of
diabetes and hypertension.

EXAM:
CHEST - 2 VIEW

[chest lat]
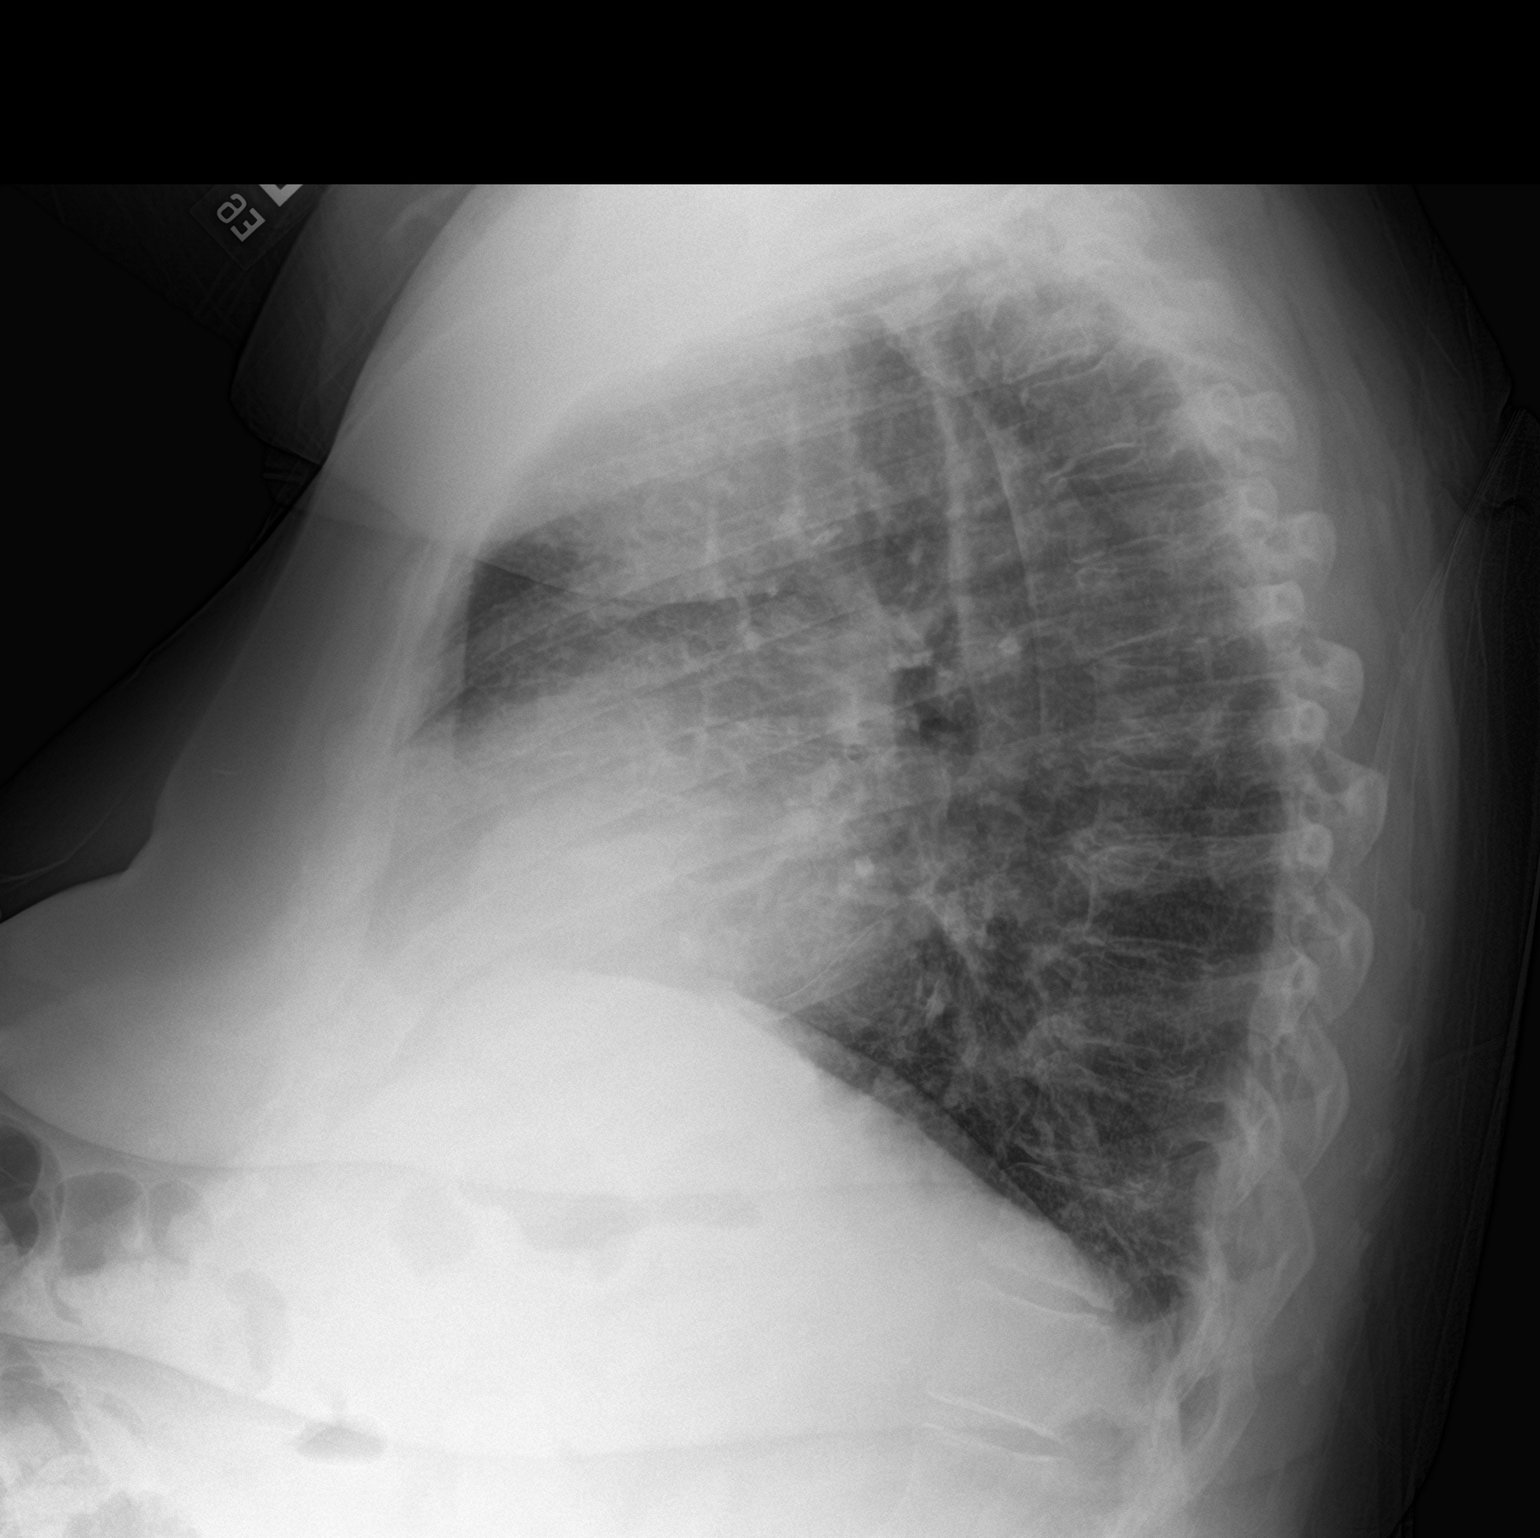

[chest ap]
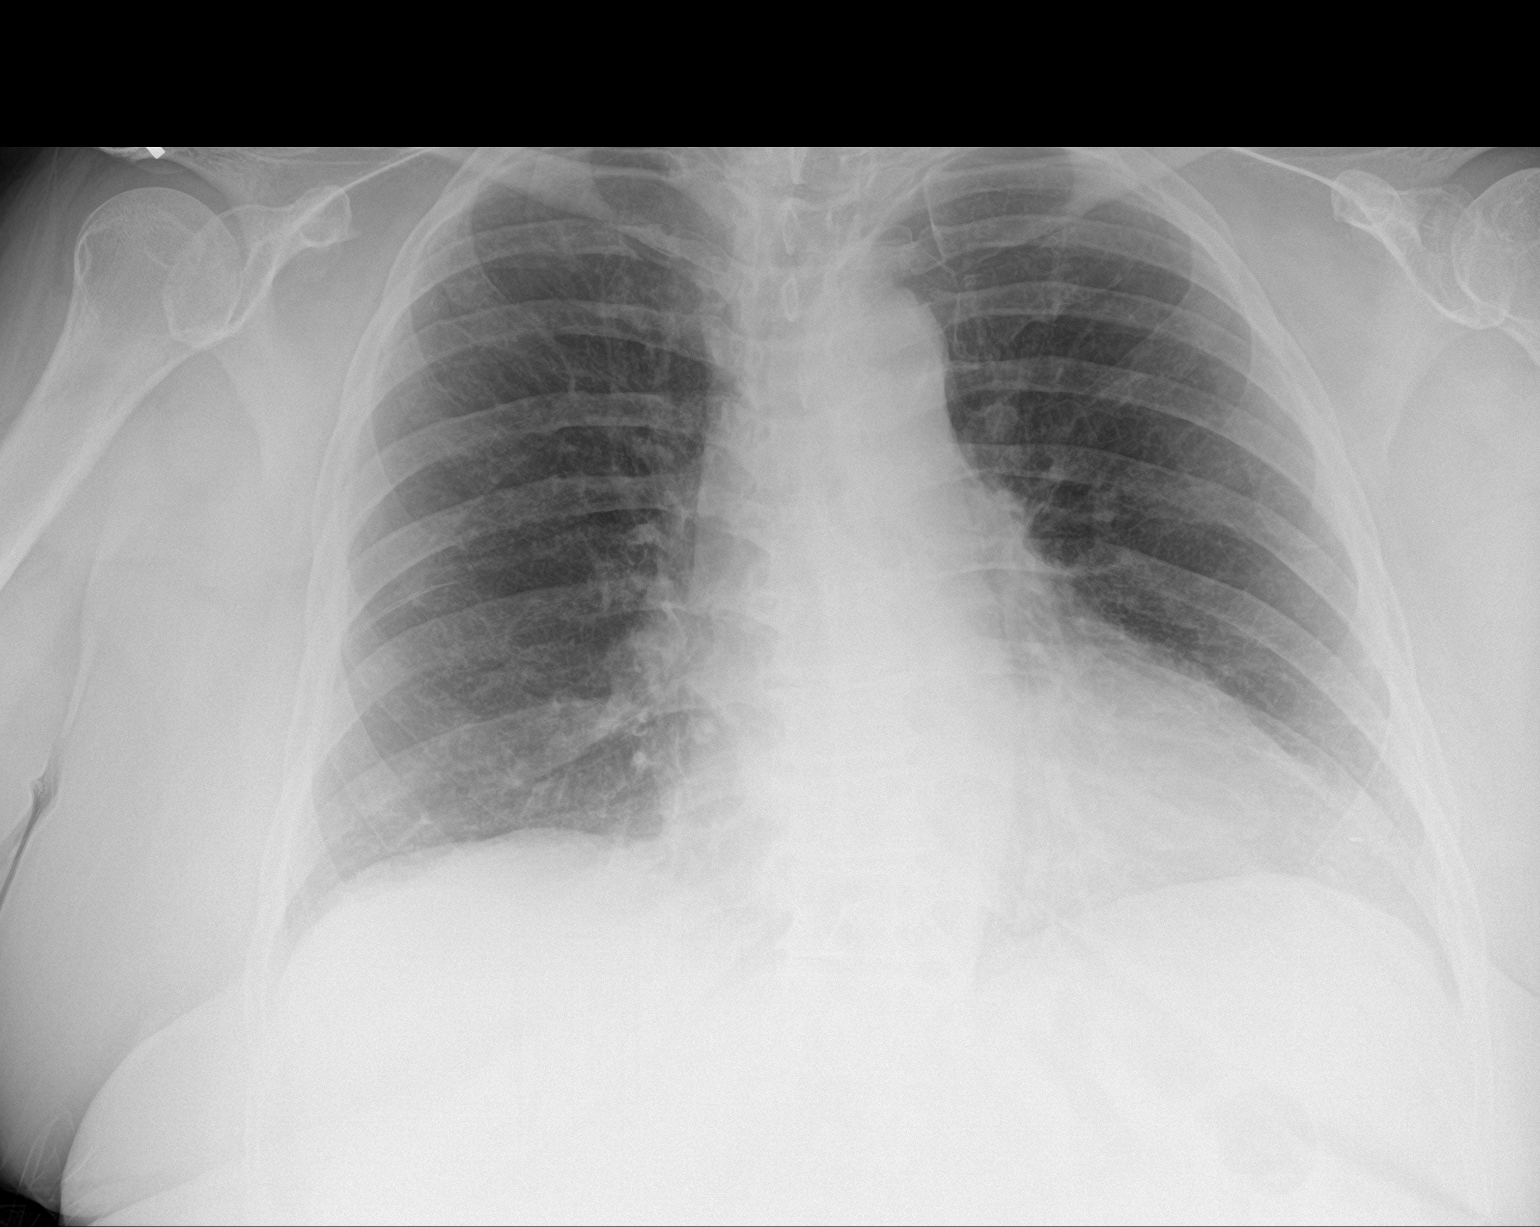

[2 of 2 positions shown; findings below may reference images not displayed]

FINDINGS: The heart size and mediastinal contours are stable. There is aortic
and coronary artery atherosclerosis. Chronic interstitial prominence
corresponding with emphysema on CT appears unchanged. No evidence of
superimposed edema, confluent airspace opacity, pleural effusion or
pneumothorax. Stable mild degenerative changes in the spine.
Previous lower cervical fusion noted.
IMPRESSION: Stable chest with mild chronic interstitial lung disease. No acute
cardiopulmonary process.

## 2022-05-21 DIAGNOSIS — M5116 Intervertebral disc disorders with radiculopathy, lumbar region: Secondary | ICD-10-CM | POA: Diagnosis not present

## 2022-05-21 DIAGNOSIS — M4316 Spondylolisthesis, lumbar region: Secondary | ICD-10-CM | POA: Diagnosis not present

## 2022-05-21 DIAGNOSIS — M48061 Spinal stenosis, lumbar region without neurogenic claudication: Secondary | ICD-10-CM | POA: Diagnosis not present

## 2022-05-21 DIAGNOSIS — J069 Acute upper respiratory infection, unspecified: Secondary | ICD-10-CM | POA: Diagnosis not present

## 2022-05-24 DIAGNOSIS — E1165 Type 2 diabetes mellitus with hyperglycemia: Secondary | ICD-10-CM | POA: Diagnosis not present

## 2022-06-03 DIAGNOSIS — E1169 Type 2 diabetes mellitus with other specified complication: Secondary | ICD-10-CM | POA: Diagnosis not present

## 2022-06-03 DIAGNOSIS — E782 Mixed hyperlipidemia: Secondary | ICD-10-CM | POA: Diagnosis not present

## 2022-06-04 DIAGNOSIS — Z0001 Encounter for general adult medical examination with abnormal findings: Secondary | ICD-10-CM | POA: Diagnosis not present

## 2022-06-04 DIAGNOSIS — Z0000 Encounter for general adult medical examination without abnormal findings: Secondary | ICD-10-CM | POA: Diagnosis not present

## 2022-06-05 DIAGNOSIS — E782 Mixed hyperlipidemia: Secondary | ICD-10-CM | POA: Diagnosis not present

## 2022-06-05 DIAGNOSIS — N3281 Overactive bladder: Secondary | ICD-10-CM | POA: Diagnosis not present

## 2022-06-05 DIAGNOSIS — G40909 Epilepsy, unspecified, not intractable, without status epilepticus: Secondary | ICD-10-CM | POA: Diagnosis not present

## 2022-06-05 DIAGNOSIS — E875 Hyperkalemia: Secondary | ICD-10-CM | POA: Diagnosis not present

## 2022-06-05 DIAGNOSIS — I10 Essential (primary) hypertension: Secondary | ICD-10-CM | POA: Diagnosis not present

## 2022-06-05 DIAGNOSIS — I129 Hypertensive chronic kidney disease with stage 1 through stage 4 chronic kidney disease, or unspecified chronic kidney disease: Secondary | ICD-10-CM | POA: Diagnosis not present

## 2022-06-05 DIAGNOSIS — E1169 Type 2 diabetes mellitus with other specified complication: Secondary | ICD-10-CM | POA: Diagnosis not present

## 2022-06-05 DIAGNOSIS — G4710 Hypersomnia, unspecified: Secondary | ICD-10-CM | POA: Diagnosis not present

## 2022-06-05 DIAGNOSIS — D509 Iron deficiency anemia, unspecified: Secondary | ICD-10-CM | POA: Diagnosis not present

## 2022-06-05 DIAGNOSIS — E1122 Type 2 diabetes mellitus with diabetic chronic kidney disease: Secondary | ICD-10-CM | POA: Diagnosis not present

## 2022-06-05 DIAGNOSIS — N182 Chronic kidney disease, stage 2 (mild): Secondary | ICD-10-CM | POA: Diagnosis not present

## 2022-06-11 DIAGNOSIS — M79672 Pain in left foot: Secondary | ICD-10-CM | POA: Diagnosis not present

## 2022-06-11 DIAGNOSIS — M792 Neuralgia and neuritis, unspecified: Secondary | ICD-10-CM | POA: Diagnosis not present

## 2022-06-11 DIAGNOSIS — M79671 Pain in right foot: Secondary | ICD-10-CM | POA: Diagnosis not present

## 2022-06-11 DIAGNOSIS — E1140 Type 2 diabetes mellitus with diabetic neuropathy, unspecified: Secondary | ICD-10-CM | POA: Diagnosis not present

## 2022-06-18 ENCOUNTER — Ambulatory Visit (HOSPITAL_COMMUNITY): Payer: Medicare HMO | Attending: Orthopedic Surgery | Admitting: Physical Therapy

## 2022-06-18 ENCOUNTER — Other Ambulatory Visit: Payer: Self-pay

## 2022-06-18 DIAGNOSIS — M6281 Muscle weakness (generalized): Secondary | ICD-10-CM | POA: Diagnosis not present

## 2022-06-18 DIAGNOSIS — M5459 Other low back pain: Secondary | ICD-10-CM

## 2022-06-18 NOTE — Therapy (Signed)
OUTPATIENT PHYSICAL THERAPY THORACOLUMBAR EVALUATION   Patient Name: Maria Esparza MRN: [id number] DOB:1945/07/26, 77 y.o., female Today's Date: 06/18/2022  END OF SESSION:  PT End of Session - 06/18/22 1045     Visit Number 1    Number of Visits 12    Date for PT Re-Evaluation 07/30/22    Authorization Type [employer] medicare    Progress Note Due on Visit 10    PT Start Time 0815    PT Stop Time 0900    PT Time Calculation (min) 45 min    Activity Tolerance Patient tolerated treatment well    Behavior During Therapy [facility] for tasks assessed/performed             Past Medical History:  Diagnosis Date   Anemia    Arthritis    Asthma    mild   Cough    Diabetes mellitus type 2, uncomplicated (HCC)    x 10 yrs   Diabetic neuropathy (HCC) rt foot   Headache(784.0)    Hypercholesteremia    Hyperlipidemia    Hypertension    Hypertriglyceridemia 2002   IBS (irritable bowel syndrome)    Neuropathy    Seizures (HCC)    last sz 01/03/2019   Past Surgical History:  Procedure Laterality Date   APPENDECTOMY     BREAST SURGERY  1999   left breast biopsy   CERVICAL SPINE SURGERY  december 2003   COLON SURGERY  1994   for unknown reason.  They thought she had a mass.   COLONOSCOPY  12/04/2010   Procedure: COLONOSCOPY;  Surgeon: Malissa Hippo, MD;  [facility];  Service: Endoscopy;  Laterality: N/A;  3:00   COLONOSCOPY N/A 01/18/2015   Procedure: COLONOSCOPY;  Surgeon: Malissa Hippo, MD;  [facility];  Service: Endoscopy;  Laterality: N/A;  955 - moved to 10:40 - Ann notified pt   Patient Active Problem List   Diagnosis Date Noted   Cholelithiasis 04/10/2020   Sepsis from Urinary Source 10/30/2018   Acute pyelonephritis 10/30/2018   DM (diabetes mellitus) 10/30/2018   Diarrhea 03/31/2012   Hypertension    Hypercholesteremia    Hyperlipidemia     PCP: Nita Sells  REFERRING PROVIDER: Gwyneth Sprout, MD  REFERRING DIAG: degenerative disc  disease, lumbar spondylolisthesis of lumbar region  Rationale for Evaluation and Treatment: Rehabilitation  THERAPY DIAG:  Low back pain Muscle weakness  ONSET DATE: chronic  SUBJECTIVE STATEMENT: PT states that she has seizures.  States she has fallen frequently.  She has had chronic back pain but the pain has increased for the past six months and it is almost intolerable at this time.  Pt states that her back pain is bilateral and there is no radicular sx.  Pain occurs quickly with weight bearing.  Pt states that she has surgery scheduled for her back on June 3rd.   PERTINENT HISTORY:  Cervical surgery, DM, HTN  PAIN:  Are you having pain? Yes: NPRS scale: 0/10; worst is a 10/10 Pain location: low back Pain description: aching  Aggravating factors: weight bearing  Relieving factors: sitting   PRECAUTIONS: None  WEIGHT BEARING RESTRICTIONS: No  FALLS:  Has patient fallen in last 6 months? No  LIVING ENVIRONMENT: Lives with: lives with their family Lives in: House/apartment Stairs:  does not need to use them  Has following equipment at home: Single point cane; she will use the walker if she has to walk for any distance.   OCCUPATION: retired  PLOF: Independent  PATIENT GOALS: less pain to be able to stand/walk longer without pain  NEXT MD VISIT: May 3  OBJECTIVE:   DIAGNOSTIC FINDINGS:  IMPRESSION: 1. Right paracentral protrusion T12-L1 without significant spinal stenosis. 2. Mild multifactorial spinal stenosis L1-2. 3. Moderate multifactorial spinal stenosis and left foraminal stenosis L2-3. 4. Moderate-severe multifactorial spinal stenosis and left foraminal stenosis L3-4. 5. Mild multifactorial spinal stenosis L4-5 with left foraminal encroachment. 6. Cholelithiasis. 7.   Aortic Atherosclerosis (ICD10-I70.0)  PATIENT SURVEYS:  FOTO 38   COGNITION: Overall cognitive status: Within functional limits for tasks assessed   POSTURE: rounded shoulders and flexed trunk   LUMBAR ROM:   AROM eval  Flexion Hands to mid shin increased pain with return more painful than going down   Extension 0; pt starts in 20 degree forward bend   Right lateral flexion   Left lateral flexion   Right rotation   Left rotation    (Blank rows = not tested)    LOWER EXTREMITY MMT:    MMT Right eval Left eval  Hip flexion 4- 4-  Hip extension 2+ 2+  Hip abduction 4 3-  Hip adduction    Hip internal rotation    Hip external rotation    Knee flexion 4- 4-  Knee extension 4+ 3+  Ankle dorsiflexion 3+ 3+  Ankle plantarflexion    Ankle inversion    Ankle eversion     (Blank rows = not tested) FUNCTIONAL TESTS:  30 seconds chair stand test:  5; 6 is poor for age and sex   2 minute walk test: 226 with cane and increased pain; pt unsteady walking.  Single leg stance:  Rt:  0 Lt:  1"   TODAY'S TREATMENT:  DATE:  06/18/22  Sitting: Tall posture x 10 Abdominal set x 10 Scapular retraction x 10    PATIENT EDUCATION:  Education details: HEP Patient  Explanation, Verbal cues, and Handouts verbalized understanding  HOME EXERCISE PROGRAM: Access Code: [email or web address] URL: [email or web address] Date: 06/18/2022 Prepared by: Virgina Organ  Exercises - Correct Seated Posture  - 3 x daily - 7 x weekly - 1 sets - 10 reps - 3-5" hold - Seated Scapular Retraction  - 3 x daily - 7 x weekly - 1 sets - 10 reps - 3-5" hold - Seated Transversus Abdominis Bracing  - 3 x daily - 7 x weekly - 1 sets - 10 reps - 3-5" hold  ASSESSMENT:  CLINICAL IMPRESSION: Patient is a 77 y.o. female who was seen today for physical therapy evaluation and  treatment for low back pain. Pt is scheduled for surgery in June but has significant weakness at this time and will benefit from skilled PT to improve her knowledge of body mechanics, improve her flexibility and improve her core and LE strength to have improved outcome and decreased pain.   OBJECTIVE IMPAIRMENTS: Abnormal gait, decreased activity tolerance, decreased mobility, decreased strength, obesity, and pain.   ACTIVITY LIMITATIONS: carrying, lifting, bending, standing, and locomotion level  PARTICIPATION LIMITATIONS: cleaning, shopping, and community activity  PERSONAL FACTORS: Age, Fitness, Past/current experiences, Time since onset of injury/illness/exacerbation, and 3 co-morbidities: OA, cervical surgery, seizures are also affecting patient's functional outcome.   REHAB POTENTIAL: Fair    CLINICAL DECISION MAKING: Evolving/moderate complexity  EVALUATION COMPLEXITY: Moderate   GOALS: Goals reviewed with patient? No  SHORT TERM GOALS: Target date: 07/09/22  Pt to be I in HEP in order to decrease her pain to no greater than a 7/10 Baseline: Goal status: INITIAL  2.  Pt strength to increase 1/2 grade to allow pt to be able to come sit to stand without assist from her husband Baseline:  Goal status: INITIAL  3.  PT to demonstrate improved body mechanics to decrease stress off of low back.  Baseline:  Goal status: INITIAL    Long Term Goals: Pt to be I in an advanced HEP to be able to decrease low back pain to no greater than a 5/10 Baseline:  Goal status: INITIAL  2.  Pt strength to be increased one grade to be able to come sit to stand without UE assist  Baseline:  Goal status: INITIAL  3.  PT to be able to single leg stance for  5 seconds to reduce risk of falling  Baseline:  Goal status: INITIAL   PLAN:  PT FREQUENCY: 2x/week  PT DURATION: 6 weeks  PLANNED INTERVENTIONS: Therapeutic exercises, Neuromuscular re-education, Balance training, Gait training,  Patient/Family education, Self Care, Joint mobilization, and Manual therapy.  PLAN FOR NEXT SESSION: begin lumbar stabilization program and Optometrist training.    Virgina Organ, PT CLT [phone]  06/18/2022, 10:46 AM

## 2022-06-22 ENCOUNTER — Encounter (HOSPITAL_COMMUNITY): Payer: Medicare HMO | Admitting: Physical Therapy

## 2022-06-22 ENCOUNTER — Ambulatory Visit (HOSPITAL_COMMUNITY): Payer: Medicare HMO

## 2022-06-22 DIAGNOSIS — M6281 Muscle weakness (generalized): Secondary | ICD-10-CM | POA: Diagnosis not present

## 2022-06-22 DIAGNOSIS — E1140 Type 2 diabetes mellitus with diabetic neuropathy, unspecified: Secondary | ICD-10-CM | POA: Diagnosis not present

## 2022-06-22 DIAGNOSIS — M5459 Other low back pain: Secondary | ICD-10-CM

## 2022-06-22 DIAGNOSIS — M79671 Pain in right foot: Secondary | ICD-10-CM | POA: Diagnosis not present

## 2022-06-22 DIAGNOSIS — M79672 Pain in left foot: Secondary | ICD-10-CM | POA: Diagnosis not present

## 2022-06-22 DIAGNOSIS — L110 Acquired keratosis follicularis: Secondary | ICD-10-CM | POA: Diagnosis not present

## 2022-06-22 NOTE — Therapy (Signed)
OUTPATIENT PHYSICAL THERAPY THORACOLUMBAR Treatment    Patient Name: Maria Esparza MRN: [id number] DOB:1945-09-16, 77 y.o., female Today's Date: 06/22/2022  END OF SESSION:    Past Medical History:  Diagnosis Date   Anemia    Arthritis    Asthma    mild   Cough    Diabetes mellitus type 2, uncomplicated (HCC)    x 10 yrs   Diabetic neuropathy (HCC) rt foot   Headache(784.0)    Hypercholesteremia    Hyperlipidemia    Hypertension    Hypertriglyceridemia 2002   IBS (irritable bowel syndrome)    Neuropathy    Seizures (HCC)    last sz 01/03/2019   Past Surgical History:  Procedure Laterality Date   APPENDECTOMY     BREAST SURGERY  1999   left breast biopsy   CERVICAL SPINE SURGERY  december 2003   COLON SURGERY  1994   for unknown reason.  They thought she had a mass.   COLONOSCOPY  12/04/2010   Procedure: COLONOSCOPY;  Surgeon: Malissa Hippo, MD;  Location: [facility];  Service: Endoscopy;  Laterality: N/A;  3:00   COLONOSCOPY N/A 01/18/2015   Procedure: COLONOSCOPY;  Surgeon: Malissa Hippo, MD;  Location: [facility];  Service: Endoscopy;  Laterality: N/A;  955 - moved to 10:40 - Ann notified pt   Patient Active Problem List   Diagnosis Date Noted   Cholelithiasis 04/10/2020   Sepsis from Urinary Source 10/30/2018   Acute pyelonephritis 10/30/2018   DM (diabetes mellitus) 10/30/2018   Diarrhea 03/31/2012   Hypertension    Hypercholesteremia    Hyperlipidemia     PCP: Nita Sells  REFERRING PROVIDER: Gwyneth Sprout, MD  REFERRING DIAG: degenerative disc disease, lumbar spondylolisthesis of lumbar region  Rationale for Evaluation and Treatment: Rehabilitation  THERAPY DIAG:  Low back pain Muscle weakness  ONSET DATE: chronic                                                                                                                                                                                        SUBJECTIVE STATEMENT:Pt states that  she would be doing better if she was not having to come here.  Pt has been completing her exercises.  She states that after she left last treatment she could not move her Lt elbow.  Pt states that she did fairly well with her exercises over the weekend  PERTINENT HISTORY:  Cervical surgery, DM, HTN  PAIN:  Are you having pain? Yes: NPRS scale: 0/10; worst is a 10/10 Pain location: low back Pain description: aching  Aggravating factors: weight bearing  Relieving factors: sitting  PRECAUTIONS: None  WEIGHT BEARING RESTRICTIONS: No  FALLS:  Has patient fallen in last 6 months? No  LIVING ENVIRONMENT: Lives with: lives with their family Lives in: House/apartment Stairs:  does not need to use them  Has following equipment at home: Single point cane; she will use the walker if she has to walk for any distance.   OCCUPATION: retired  PLOF: Independent  PATIENT GOALS: less pain to be able to stand/walk longer without pain  NEXT MD VISIT: May 3  OBJECTIVE:   DIAGNOSTIC FINDINGS:  IMPRESSION: 1. Right paracentral protrusion T12-L1 without significant spinal stenosis. 2. Mild multifactorial spinal stenosis L1-2. 3. Moderate multifactorial spinal stenosis and left foraminal stenosis L2-3. 4. Moderate-severe multifactorial spinal stenosis and left foraminal stenosis L3-4. 5. Mild multifactorial spinal stenosis L4-5 with left foraminal encroachment. 6. Cholelithiasis. 7.  Aortic Atherosclerosis (ICD10-I70.0)  PATIENT SURVEYS:  FOTO 38   COGNITION: Overall cognitive status: Within functional limits for tasks assessed   POSTURE: rounded shoulders and flexed trunk   LUMBAR ROM:   AROM eval  Flexion Hands to mid shin increased pain with return more painful than going down   Extension 0; pt starts in 20 degree forward bend   Right lateral flexion   Left lateral flexion   Right rotation   Left rotation    (Blank rows = not tested)    LOWER EXTREMITY MMT:    MMT  Right eval Left eval  Hip flexion 4- 4-  Hip extension 2+ 2+  Hip abduction 4 3-  Hip adduction    Hip internal rotation    Hip external rotation    Knee flexion 4- 4-  Knee extension 4+ 3+  Ankle dorsiflexion 3+ 3+  Ankle plantarflexion    Ankle inversion    Ankle eversion     (Blank rows = not tested) FUNCTIONAL TESTS:  30 seconds chair stand test:  5; 6 is poor for age and sex   2 minute walk test: 226 with cane and increased pain; pt unsteady walking.  Single leg stance:  Rt:  0 Lt:  1"   TODAY'S TREATMENT:                                                                                                                              DATE:  06/21/21 Standing: Heel raise x 10  Tall posture x 10  Abdominal/glut set x10  Supine: Bridge x 10  06/18/22  Sitting: Tall posture x 10 Abdominal set x 10 Scapular retraction x 10    PATIENT EDUCATION:  Education details: HEP Patient  Explanation, Verbal cues, and Handouts verbalized understanding  HOME EXERCISE PROGRAM: Access Code: [email or web address] URL: [email or web address] Date: 06/18/2022 Prepared by: Virgina Organ  Exercises - Correct Seated Posture  - 3 x daily - 7 x weekly - 1 sets - 10 reps - 3-5" hold - Seated Scapular Retraction  - 3 x daily - 7 x weekly - 1 sets -  10 reps - 3-5" hold - Seated Transversus Abdominis Bracing  - 3 x daily - 7 x weekly - 1 sets - 10 reps - 3-5" hold  ASSESSMENT:  CLINICAL IMPRESSION: Patient is a 77 y.o. female who was seen today for physical therapy evaluation and treatment for low back pain. Pt is scheduled for surgery in June but has significant weakness at this time and will benefit from skilled PT to improve her knowledge of body mechanics, improve her flexibility and improve her core and LE strength to have improved outcome and decreased pain.   OBJECTIVE IMPAIRMENTS: Abnormal gait, decreased activity tolerance, decreased mobility, decreased strength, obesity, and  pain.   ACTIVITY LIMITATIONS: carrying, lifting, bending, standing, and locomotion level  PARTICIPATION LIMITATIONS: cleaning, shopping, and community activity  PERSONAL FACTORS: Age, Fitness, Past/current experiences, Time since onset of injury/illness/exacerbation, and 3 co-morbidities: OA, cervical surgery, seizures are also affecting patient's functional outcome.   REHAB POTENTIAL: Fair    CLINICAL DECISION MAKING: Evolving/moderate complexity  EVALUATION COMPLEXITY: Moderate   GOALS: Goals reviewed with patient? No  SHORT TERM GOALS: Target date: 07/09/22  Pt to be I in HEP in order to decrease her pain to no greater than a 7/10 Baseline: Goal status: IN PROGRESS  2.  Pt strength to increase 1/2 grade to allow pt to be able to come sit to stand without assist from her husband Baseline:  Goal status: IN PROGRESS  3.  PT to demonstrate improved body mechanics to decrease stress off of low back.  Baseline:  Goal status: IN PROGRESS    Long Term Goals: Pt to be I in an advanced HEP to be able to decrease low back pain to no greater than a 5/10 Baseline:  Goal status: IN PROGRESS  2.  Pt strength to be increased one grade to be able to come sit to stand without UE assist  Baseline:  Goal status: in progress  3.  PT to be able to single leg stance for  5 seconds to reduce risk of falling  Baseline:  Goal status: in progress    PLAN:  PT FREQUENCY: 2x/week  PT DURATION: 6 weeks  PLANNED INTERVENTIONS: Therapeutic exercises, Neuromuscular re-education, Balance training, Gait training, Patient/Family education, Self Care, Joint mobilization, and Manual therapy.  PLAN FOR NEXT SESSION: begin lumbar stabilization program and Optometrist training.

## 2022-06-22 NOTE — Therapy (Signed)
OUTPATIENT PHYSICAL THERAPY THORACOLUMBAR EVALUATION   Patient Name: Maria Esparza MRN: [id number] DOB:March 13, 1945, 77 y.o., female Today's Date: 06/22/2022  END OF SESSION:  PT End of Session - 06/22/22 1213     Visit Number 2    Number of Visits 12    Date for PT Re-Evaluation 07/30/22    Authorization Type [employer] medicare    Progress Note Due on Visit 10    PT Start Time 1140    PT Stop Time 1218    PT Time Calculation (min) 38 min    Activity Tolerance Patient tolerated treatment well    Behavior During Therapy WFL for tasks assessed/performed              Past Medical History:  Diagnosis Date   Anemia    Arthritis    Asthma    mild   Cough    Diabetes mellitus type 2, uncomplicated (HCC)    x 10 yrs   Diabetic neuropathy (HCC) rt foot   Headache(784.0)    Hypercholesteremia    Hyperlipidemia    Hypertension    Hypertriglyceridemia 2002   IBS (irritable bowel syndrome)    Neuropathy    Seizures (HCC)    last sz 01/03/2019   Past Surgical History:  Procedure Laterality Date   APPENDECTOMY     BREAST SURGERY  1999   left breast biopsy   CERVICAL SPINE SURGERY  december 2003   COLON SURGERY  1994   for unknown reason.  They thought she had a mass.   COLONOSCOPY  12/04/2010   Procedure: COLONOSCOPY;  Surgeon: Malissa Hippo, MD;  Location: [facility];  Service: Endoscopy;  Laterality: N/A;  3:00   COLONOSCOPY N/A 01/18/2015   Procedure: COLONOSCOPY;  Surgeon: Malissa Hippo, MD;  [facility]: [facility];  Service: Endoscopy;  Laterality: N/A;  955 - moved to 10:40 - Ann notified pt   Patient Active Problem List   Diagnosis Date Noted   Cholelithiasis 04/10/2020   Sepsis from Urinary Source 10/30/2018   Acute pyelonephritis 10/30/2018   DM (diabetes mellitus) 10/30/2018   Diarrhea 03/31/2012   Hypertension    Hypercholesteremia    Hyperlipidemia     PCP: Nita Sells  REFERRING PROVIDER: Gwyneth Sprout, MD  REFERRING DIAG: degenerative disc  disease, lumbar spondylolisthesis of lumbar region  Rationale for Evaluation and Treatment: Rehabilitation  THERAPY DIAG:  Low back pain Muscle weakness  ONSET DATE: chronic  SUBJECTIVE STATEMENT: 06/22/22:  Pt reports increased pain on Rt elbow following last session, pain resolved on Saturday.  Stated her LBP hurts as bad as usual, the only relief she has is during sitting.  Stated she hasn't done as much today.  LBP scale 6/10.  Eval:PT states that she has seizures.  States she has fallen frequently.  She has had chronic back pain but the pain has increased for the past six months and it is almost intolerable at this time.  Pt states that her back pain is bilateral and there is no radicular sx.  Pain occurs quickly with weight bearing.  Pt states that she has surgery scheduled for her back on June 3rd.   PERTINENT HISTORY:  Cervical surgery, DM, HTN  PAIN:  Are you having pain? Yes: NPRS scale: 0/10; worst is a 10/10 Pain location: low back Pain description: aching  Aggravating factors: weight bearing  Relieving factors: sitting   PRECAUTIONS: None  WEIGHT BEARING RESTRICTIONS: No  FALLS:  Has patient fallen in last 6 months? No  LIVING ENVIRONMENT: Lives with: lives with their family Lives in: House/apartment Stairs:  does not need to use them  Has following equipment at home: Single point cane; she will use the walker if she has to walk for any distance.   OCCUPATION: retired  PLOF: Independent  PATIENT GOALS: less pain to be able to stand/walk longer without pain  NEXT MD VISIT: May 3  OBJECTIVE:   DIAGNOSTIC FINDINGS:  IMPRESSION: 1. Right paracentral protrusion T12-L1 without significant spinal stenosis. 2. Mild multifactorial spinal stenosis L1-2. 3. Moderate multifactorial  spinal stenosis and left foraminal stenosis L2-3. 4. Moderate-severe multifactorial spinal stenosis and left foraminal stenosis L3-4. 5. Mild multifactorial spinal stenosis L4-5 with left foraminal encroachment. 6. Cholelithiasis. 7.  Aortic Atherosclerosis (ICD10-I70.0)  PATIENT SURVEYS:  FOTO 38   COGNITION: Overall cognitive status: Within functional limits for tasks assessed   POSTURE: rounded shoulders and flexed trunk   LUMBAR ROM:   AROM eval  Flexion Hands to mid shin increased pain with return more painful than going down   Extension 0; pt starts in 20 degree forward bend   Right lateral flexion   Left lateral flexion   Right rotation   Left rotation    (Blank rows = not tested)    LOWER EXTREMITY MMT:    MMT Right eval Left eval  Hip flexion 4- 4-  Hip extension 2+ 2+  Hip abduction 4 3-  Hip adduction    Hip internal rotation    Hip external rotation    Knee flexion 4- 4-  Knee extension 4+ 3+  Ankle dorsiflexion 3+ 3+  Ankle plantarflexion    Ankle inversion    Ankle eversion     (Blank rows = not tested) FUNCTIONAL TESTS:  30 seconds chair stand test:  5; 6 is poor for age and sex   2 minute walk test: 226 with cane and increased pain; pt unsteady walking.  Single leg stance:  Rt:  0 Lt:  1"   TODAY'S TREATMENT:  DATE:  06/22/22:  Maria Esparza reviewed goals Educated importance of exercise Sitting: Tall posture x 10 Abdominal set x 10 Scapular retraction x 10  Maria Esparza Supine: Educated diaphragmatic breathing x 2 min Abdominal sets 2 sets 10x Glut sets Bridge 10x 3"  06/18/22  Sitting: Tall posture x 10 Abdominal set x 10 Scapular retraction x 10    PATIENT EDUCATION:  Education details: HEP Patient  Explanation, Verbal cues, and Handouts verbalized understanding  HOME EXERCISE PROGRAM: Access Code:  [email or web address] URL: [email or web address] Date: 06/18/2022 Prepared by: Virgina Organ  Exercises - Correct Seated Posture  - 3 x daily - 7 x weekly - 1 sets - 10 reps - 3-5" hold - Seated Scapular Retraction  - 3 x daily - 7 x weekly - 1 sets - 10 reps - 3-5" hold - Seated Transversus Abdominis Bracing  - 3 x daily - 7 x weekly - 1 sets - 10 reps - 3-5" hold  06/22/22: Bridge  ASSESSMENT:  CLINICAL IMPRESSION: Evaluating therapist began session due to delayed start, began session reviewing goals and educated on importance of HEP compliance for maximal benefits.  Educated benefits with posture for pain control and importance of core/proximal strengthening.  Session focus with abdominal strengthening with verbal and tactile cueing to improve activation and reduce compensation.  Added partial raise bridge to HEP with printout given and verbalized understanding.    Eval:  Patient is a 77 y.o. female who was seen today for physical therapy evaluation and treatment for low back pain. Pt is scheduled for surgery in June but has significant weakness at this time and will benefit from skilled PT to improve her knowledge of body mechanics, improve her flexibility and improve her core and LE strength to have improved outcome and decreased pain.   OBJECTIVE IMPAIRMENTS: Abnormal gait, decreased activity tolerance, decreased mobility, decreased strength, obesity, and pain.   ACTIVITY LIMITATIONS: carrying, lifting, bending, standing, and locomotion level  PARTICIPATION LIMITATIONS: cleaning, shopping, and community activity  PERSONAL FACTORS: Age, Fitness, Past/current experiences, Time since onset of injury/illness/exacerbation, and 3 co-morbidities: OA, cervical surgery, seizures are also affecting patient's functional outcome.   REHAB POTENTIAL: Fair    CLINICAL DECISION MAKING: Evolving/moderate complexity  EVALUATION COMPLEXITY: Moderate   GOALS: Goals reviewed with patient?  No  SHORT TERM GOALS: Target date: 07/09/22  Pt to be I in HEP in order to decrease her pain to no greater than a 7/10 Baseline: Goal status: IN PROGRESS  2.  Pt strength to increase 1/2 grade to allow pt to be able to come sit to stand without assist from her husband Baseline:  Goal status: IN PROGRESS  3.  PT to demonstrate improved body mechanics to decrease stress off of low back.  Baseline:  Goal status: IN PROGRESS    Long Term Goals: Pt to be I in an advanced HEP to be able to decrease low back pain to no greater than a 5/10 Baseline:  Goal status: IN PROGRESS  2.  Pt strength to be increased one grade to be able to come sit to stand without UE assist  Baseline:  Goal status: IN PROGRESS  3.  PT to be able to single leg stance for  5 seconds to reduce risk of falling  Baseline:  Goal status: IN PROGRESS   PLAN:  PT FREQUENCY: 2x/week  PT DURATION: 6 weeks  PLANNED INTERVENTIONS: Therapeutic exercises, Neuromuscular re-education, Balance training, Gait training, Patient/Family education, Self Care, Joint mobilization, and Manual therapy.  PLAN FOR NEXT  SESSION: begin lumbar stabilization program and Optometrist training.    Virgina Organ, PT CLT [phone]   Becky Sax, LPTA/CLT; [place] [place] [phone] 06/22/2022, 12:46 PM

## 2022-06-24 ENCOUNTER — Encounter (HOSPITAL_COMMUNITY): Payer: Medicare HMO | Admitting: Physical Therapy

## 2022-06-24 DIAGNOSIS — E1165 Type 2 diabetes mellitus with hyperglycemia: Secondary | ICD-10-CM | POA: Diagnosis not present

## 2022-06-28 DIAGNOSIS — G4730 Sleep apnea, unspecified: Secondary | ICD-10-CM | POA: Diagnosis not present

## 2022-07-01 ENCOUNTER — Encounter (HOSPITAL_COMMUNITY): Payer: Medicare HMO

## 2022-07-03 ENCOUNTER — Encounter (HOSPITAL_COMMUNITY): Payer: Self-pay

## 2022-07-03 ENCOUNTER — Ambulatory Visit (HOSPITAL_COMMUNITY): Payer: Medicare HMO | Attending: Orthopedic Surgery

## 2022-07-03 DIAGNOSIS — M5459 Other low back pain: Secondary | ICD-10-CM | POA: Insufficient documentation

## 2022-07-03 DIAGNOSIS — M6281 Muscle weakness (generalized): Secondary | ICD-10-CM | POA: Diagnosis not present

## 2022-07-03 NOTE — Therapy (Signed)
OUTPATIENT PHYSICAL THERAPY THORACOLUMBAR TREATMENT   Patient Name: Maria Esparza MRN: [id number] DOB:[birth year], 77 y.o., female Today's Date: 07/03/2022  END OF SESSION:  PT End of Session - 07/03/22 0745     Visit Number 3    Number of Visits 12    Date for PT Re-Evaluation 07/30/22    Authorization Type [employer] medicare    Progress Note Due on Visit 10    PT Start Time 0734    PT Stop Time 0813    PT Time Calculation (min) 39 min    Activity Tolerance Patient tolerated treatment well    Behavior During Therapy [facility] for tasks assessed/performed               Past Medical History:  Diagnosis Date   Anemia    Arthritis    Asthma    mild   Cough    Diabetes mellitus type 2, uncomplicated (HCC)    x 10 yrs   Diabetic neuropathy (HCC) rt foot   Headache(784.0)    Hypercholesteremia    Hyperlipidemia    Hypertension    Hypertriglyceridemia 2002   IBS (irritable bowel syndrome)    Neuropathy    Seizures (HCC)    last sz 01/03/2019   Past Surgical History:  Procedure Laterality Date   APPENDECTOMY     BREAST SURGERY  1999   left breast biopsy   CERVICAL SPINE SURGERY  december 2003   COLON SURGERY  1994   for unknown reason.  They thought she had a mass.   COLONOSCOPY  12/04/2010   Procedure: COLONOSCOPY;  Surgeon: Malissa Hippo, MD;  [facility];  Service: Endoscopy;  Laterality: N/A;  3:00   COLONOSCOPY N/A 01/18/2015   Procedure: COLONOSCOPY;  Surgeon: Malissa Hippo, MD;  [facility];  Service: Endoscopy;  Laterality: N/A;  955 - moved to 10:40 - Ann notified pt   Patient Active Problem List   Diagnosis Date Noted   Cholelithiasis 04/10/2020   Sepsis from Urinary Source 10/30/2018   Acute pyelonephritis 10/30/2018   DM (diabetes mellitus) (HCC) 10/30/2018   Diarrhea 03/31/2012   Hypertension    Hypercholesteremia    Hyperlipidemia     PCP: Nita Sells  REFERRING PROVIDER: Gwyneth Sprout, MD  REFERRING DIAG: degenerative  disc disease, lumbar spondylolisthesis of lumbar region  Rationale for Evaluation and Treatment: Rehabilitation  THERAPY DIAG:  Low back pain Muscle weakness  ONSET DATE: chronic  SUBJECTIVE STATEMENT: 07/03/22:  Pt reports she had the worst seizure on Wednesday lasting for 45 minutes and fell down stairs  on 06/21/22 leaving her son's house.  Stated she hasn't completed a lot of exercises or activities so no real pain.   Eval:PT states that she has seizures.  States she has fallen frequently.  She has had chronic back pain but the pain has increased for the past six months and it is almost intolerable at this time.  Pt states that her back pain is bilateral and there is no radicular sx.  Pain occurs quickly with weight bearing.  Pt states that she has surgery scheduled for her back on June 3rd.   PERTINENT HISTORY:  Cervical surgery, DM, HTN  PAIN:  Are you having pain? Yes: NPRS scale: 0/10; worst is a 10/10 Pain location: low back Pain description: aching  Aggravating factors: weight bearing  Relieving factors: sitting   PRECAUTIONS: None  WEIGHT BEARING RESTRICTIONS: No  FALLS:  Has patient fallen in last 6 months? No  LIVING ENVIRONMENT: Lives with: lives with their family Lives in: House/apartment Stairs:  does not need to use them  Has following equipment at home: Single point cane; she will use the walker if she has to walk for any distance.   OCCUPATION: retired  PLOF: Independent  PATIENT GOALS: less pain to be able to stand/walk longer without pain  NEXT MD VISIT: May 3  OBJECTIVE:   DIAGNOSTIC FINDINGS:  IMPRESSION: 1. Right paracentral protrusion T12-L1 without significant spinal stenosis. 2. Mild multifactorial spinal stenosis L1-2. 3. Moderate multifactorial spinal  stenosis and left foraminal stenosis L2-3. 4. Moderate-severe multifactorial spinal stenosis and left foraminal stenosis L3-4. 5. Mild multifactorial spinal stenosis L4-5 with left foraminal encroachment. 6. Cholelithiasis. 7.  Aortic Atherosclerosis (ICD10-I70.0)  PATIENT SURVEYS:  FOTO 38   COGNITION: Overall cognitive status: Within functional limits for tasks assessed   POSTURE: rounded shoulders and flexed trunk   LUMBAR ROM:   AROM eval  Flexion Hands to mid shin increased pain with return more painful than going down   Extension 0; pt starts in 20 degree forward bend   Right lateral flexion   Left lateral flexion   Right rotation   Left rotation    (Blank rows = not tested)    LOWER EXTREMITY MMT:    MMT Right eval Left eval  Hip flexion 4- 4-  Hip extension 2+ 2+  Hip abduction 4 3-  Hip adduction    Hip internal rotation    Hip external rotation    Knee flexion 4- 4-  Knee extension 4+ 3+  Ankle dorsiflexion 3+ 3+  Ankle plantarflexion    Ankle inversion    Ankle eversion     (Blank rows = not tested) FUNCTIONAL TESTS:  30 seconds chair stand test:  5; 6 is poor for age and sex   2 minute walk test: 226 with cane and increased pain; pt unsteady walking.  Single leg stance:  Rt:  0 Lt:  1"   TODAY'S TREATMENT:  DATE:  07/03/22: Seated: Tall posture Rows with RTB 10x 5" holds STS 5x from 21in height Supine: Educated diaphragmatic breathing x 2 min Abdominal sets 2 sets 10x Bridge 10x 5" Marching with ab set Sidelying: Clam 10x  06/22/22:  Cindy reviewed goals Educated importance of exercise Sitting: Tall posture x 10 Abdominal set x 10 Scapular retraction x 10  [place] Supine: Educated diaphragmatic breathing x 2 min Abdominal sets 2 sets 10x Glut sets Bridge 10x 3"  06/18/22  Sitting: Tall posture x  10 Abdominal set x 10 Scapular retraction x 10    PATIENT EDUCATION:  Education details: HEP Patient  Explanation, Verbal cues, and Handouts verbalized understanding  HOME EXERCISE PROGRAM: Access Code: [email or web address] URL: [email or web address] Date: 06/18/2022 Prepared by: Virgina Organ  Exercises - Correct Seated Posture  - 3 x daily - 7 x weekly - 1 sets - 10 reps - 3-5" hold - Seated Scapular Retraction  - 3 x daily - 7 x weekly - 1 sets - 10 reps - 3-5" hold - Seated Transversus Abdominis Bracing  - 3 x daily - 7 x weekly - 1 sets - 10 reps - 3-5" hold  06/22/22: Bridge  ASSESSMENT:  CLINICAL IMPRESSION: Session focus with core and proximal strengthening.  Pt required multimodal cueing to improve abdominal activation and reduce holding breath during all exercises.  Encouraged to exhale with exertion to improve pairing.  Added STS for functional strengthening with cueing for mechanics and increased height due to gluteal weakness.  No reports of pain through session.  Educated importance of increased compliance for maximal benefits with HEP.    Side note:  Therapist noted tick on pt.'s scalp that was removed, taped and given to pt.   Eval:  Patient is a 77 y.o. female who was seen today for physical therapy evaluation and treatment for low back pain. Pt is scheduled for surgery in June but has significant weakness at this time and will benefit from skilled PT to improve her knowledge of body mechanics, improve her flexibility and improve her core and LE strength to have improved outcome and decreased pain.   OBJECTIVE IMPAIRMENTS: Abnormal gait, decreased activity tolerance, decreased mobility, decreased strength, obesity, and pain.   ACTIVITY LIMITATIONS: carrying, lifting, bending, standing, and locomotion level  PARTICIPATION LIMITATIONS: cleaning, shopping, and community activity  PERSONAL FACTORS: Age, Fitness, Past/current experiences, Time since onset of  injury/illness/exacerbation, and 3 co-morbidities: OA, cervical surgery, seizures are also affecting patient's functional outcome.   REHAB POTENTIAL: Fair    CLINICAL DECISION MAKING: Evolving/moderate complexity  EVALUATION COMPLEXITY: Moderate   GOALS: Goals reviewed with patient? No  SHORT TERM GOALS: Target date: 07/09/22  Pt to be I in HEP in order to decrease her pain to no greater than a 7/10 Baseline: Goal status: IN PROGRESS  2.  Pt strength to increase 1/2 grade to allow pt to be able to come sit to stand without assist from her husband Baseline:  Goal status: IN PROGRESS  3.  PT to demonstrate improved body mechanics to decrease stress off of low back.  Baseline:  Goal status: IN PROGRESS    Long Term Goals: Pt to be I in an advanced HEP to be able to decrease low back pain to no greater than a 5/10 Baseline:  Goal status: IN PROGRESS  2.  Pt strength to be increased one grade to be able to come sit to stand without UE assist  Baseline:  Goal status: IN PROGRESS  3.  PT  to be able to single leg stance for  5 seconds to reduce risk of falling  Baseline:  Goal status: IN PROGRESS   PLAN:  PT FREQUENCY: 2x/week  PT DURATION: 6 weeks  PLANNED INTERVENTIONS: Therapeutic exercises, Neuromuscular re-education, Balance training, Gait training, Patient/Family education, Self Care, Joint mobilization, and Manual therapy.  PLAN FOR NEXT SESSION: begin lumbar stabilization program and Optometrist training.   Becky Sax, LPTA/CLT; [place] [phone] 07/03/2022, 9:26 AM

## 2022-07-06 ENCOUNTER — Ambulatory Visit (HOSPITAL_COMMUNITY): Payer: Medicare HMO | Admitting: Physical Therapy

## 2022-07-06 DIAGNOSIS — M5459 Other low back pain: Secondary | ICD-10-CM

## 2022-07-06 DIAGNOSIS — H353131 Nonexudative age-related macular degeneration, bilateral, early dry stage: Secondary | ICD-10-CM | POA: Diagnosis not present

## 2022-07-06 DIAGNOSIS — E119 Type 2 diabetes mellitus without complications: Secondary | ICD-10-CM | POA: Diagnosis not present

## 2022-07-06 DIAGNOSIS — M6281 Muscle weakness (generalized): Secondary | ICD-10-CM | POA: Diagnosis not present

## 2022-07-06 DIAGNOSIS — H35722 Serous detachment of retinal pigment epithelium, left eye: Secondary | ICD-10-CM | POA: Diagnosis not present

## 2022-07-06 NOTE — Therapy (Signed)
OUTPATIENT PHYSICAL THERAPY THORACOLUMBAR TREATMENT   Patient Name: Maria Esparza MRN: [id number] DOB:[birth year], 77 y.o., female Today's Date: 07/06/2022  END OF SESSION:  PT End of Session - 07/06/22 0824     Visit Number 4    Number of Visits 12    Date for PT Re-Evaluation 07/30/22    Authorization Type [employer] medicare    Progress Note Due on Visit 10    PT Start Time 0823    PT Stop Time 0902    PT Time Calculation (min) 39 min    Activity Tolerance Patient tolerated treatment well    Behavior During Therapy [facility] for tasks assessed/performed               Past Medical History:  Diagnosis Date   Anemia    Arthritis    Asthma    mild   Cough    Diabetes mellitus type 2, uncomplicated (HCC)    x 10 yrs   Diabetic neuropathy (HCC) rt foot   Headache(784.0)    Hypercholesteremia    Hyperlipidemia    Hypertension    Hypertriglyceridemia 2002   IBS (irritable bowel syndrome)    Neuropathy    Seizures (HCC)    last sz 01/03/2019   Past Surgical History:  Procedure Laterality Date   APPENDECTOMY     BREAST SURGERY  1999   left breast biopsy   CERVICAL SPINE SURGERY  december 2003   COLON SURGERY  1994   for unknown reason.  They thought she had a mass.   COLONOSCOPY  12/04/2010   Procedure: COLONOSCOPY;  Surgeon: Malissa Hippo, MD;  [facility];  Service: Endoscopy;  Laterality: N/A;  3:00   COLONOSCOPY N/A 01/18/2015   Procedure: COLONOSCOPY;  Surgeon: Malissa Hippo, MD;  [facility];  Service: Endoscopy;  Laterality: N/A;  955 - moved to 10:40 - Ann notified pt   Patient Active Problem List   Diagnosis Date Noted   Cholelithiasis 04/10/2020   Sepsis from Urinary Source 10/30/2018   Acute pyelonephritis 10/30/2018   DM (diabetes mellitus) (HCC) 10/30/2018   Diarrhea 03/31/2012   Hypertension    Hypercholesteremia    Hyperlipidemia     PCP: Nita Sells  REFERRING PROVIDER: Gwyneth Sprout, MD  REFERRING DIAG: degenerative  disc disease, lumbar spondylolisthesis of lumbar region  Rationale for Evaluation and Treatment: Rehabilitation  THERAPY DIAG:  Low back pain Muscle weakness  ONSET DATE: chronic  SUBJECTIVE STATEMENT: "Getting some done, having trouble with some" in reference to HEP.   Eval:PT states that she has seizures.  States she has fallen frequently.  She has had chronic back pain but the pain has increased for the past six months and it is almost intolerable at this time.  Pt states that her back pain is bilateral and there is no radicular sx.  Pain occurs quickly with weight bearing.  Pt states that she has surgery scheduled for her back on June 3rd.   PERTINENT HISTORY:  Cervical surgery, DM, HTN  PAIN:  Are you having pain? Yes: NPRS scale: 3/10 Pain location: low back Pain description: aching  Aggravating factors: weight bearing  Relieving factors: sitting   PRECAUTIONS: None  WEIGHT BEARING RESTRICTIONS: No  FALLS:  Has patient fallen in last 6 months? No  LIVING ENVIRONMENT: Lives with: lives with their family Lives in: House/apartment Stairs:  does not need to use them  Has following equipment at home: Single point cane; she will use the walker if she has to walk for any distance.   OCCUPATION: retired  PLOF: Independent  PATIENT GOALS: less pain to be able to stand/walk longer without pain  NEXT MD VISIT: May 3  OBJECTIVE:   DIAGNOSTIC FINDINGS:  IMPRESSION: 1. Right paracentral protrusion T12-L1 without significant spinal stenosis. 2. Mild multifactorial spinal stenosis L1-2. 3. Moderate multifactorial spinal stenosis and left foraminal stenosis L2-3. 4. Moderate-severe multifactorial spinal stenosis and left foraminal stenosis L3-4. 5. Mild multifactorial spinal stenosis L4-5  with left foraminal encroachment. 6. Cholelithiasis. 7.  Aortic Atherosclerosis (ICD10-I70.0)  PATIENT SURVEYS:  FOTO 38   COGNITION: Overall cognitive status: Within functional limits for tasks assessed   POSTURE: rounded shoulders and flexed trunk   LUMBAR ROM:   AROM eval  Flexion Hands to mid shin increased pain with return more painful than going down   Extension 0; pt starts in 20 degree forward bend   Right lateral flexion   Left lateral flexion   Right rotation   Left rotation    (Blank rows = not tested)    LOWER EXTREMITY MMT:    MMT Right eval Left eval  Hip flexion 4- 4-  Hip extension 2+ 2+  Hip abduction 4 3-  Hip adduction    Hip internal rotation    Hip external rotation    Knee flexion 4- 4-  Knee extension 4+ 3+  Ankle dorsiflexion 3+ 3+  Ankle plantarflexion    Ankle inversion    Ankle eversion     (Blank rows = not tested) FUNCTIONAL TESTS:  30 seconds chair stand test:  5; 6 is poor for age and sex   2 minute walk test: 226 with cane and increased pain; pt unsteady walking.  Single leg stance:  Rt:  0 Lt:  1"   TODAY'S TREATMENT:  DATE:  07/06/22 Ab brace 10 x 5"  Ab march x20 Bridge 2 x 10 LTR 10 x 5"  SLR x10 each   STS x5 Standing hip abduction x10  07/03/22: Seated: Tall posture Rows with RTB 10x 5" holds STS 5x from 21in height Supine: Educated diaphragmatic breathing x 2 min Abdominal sets 2 sets 10x Bridge 10x 5" Marching with ab set Sidelying: Clam 10x  06/22/22:  Cindy reviewed goals Educated importance of exercise Sitting: Tall posture x 10 Abdominal set x 10 Scapular retraction x 10  [place] Supine: Educated diaphragmatic breathing x 2 min Abdominal sets 2 sets 10x Glut sets Bridge 10x 3"   PATIENT EDUCATION:  Education details: HEP Patient  Explanation, Verbal cues, and  Handouts verbalized understanding  HOME EXERCISE PROGRAM: Access Code: [email or web address] URL: [email or web address] 07/06/22  - Supine Transversus Abdominis Bracing - Hands on Stomach  - 2 x daily - 7 x weekly - 2 sets - 10 reps - 3-5" hold - Sit to Stand Without Arm Support  - 2 x daily - 7 x weekly - 1-2 sets - 10 reps - Small Range Straight Leg Raise  - 2 x daily - 7 x weekly - 2 sets - 10 reps - Supine March  - 2 x daily - 7 x weekly - 2 sets - 10 reps  Date: 06/18/2022 Prepared by: Virgina Organ  Exercises - Correct Seated Posture  - 3 x daily - 7 x weekly - 1 sets - 10 reps - 3-5" hold - Seated Scapular Retraction  - 3 x daily - 7 x weekly - 1 sets - 10 reps - 3-5" hold - Seated Transversus Abdominis Bracing  - 3 x daily - 7 x weekly - 1 sets - 10 reps - 3-5" hold  06/22/22: Bridge  ASSESSMENT:  CLINICAL IMPRESSION: Patient tolerated session well overall. Does demo fatigue and requires intermittent breaks for rest. Patient requires verbal cues for pacing activity and keeping track of reps performed. Added SLR and standing hip abduction for hip and core strengthening progressions. Patient will continue to benefit from skilled therapy services to reduce remaining deficits and improve functional ability.    OBJECTIVE IMPAIRMENTS: Abnormal gait, decreased activity tolerance, decreased mobility, decreased strength, obesity, and pain.   ACTIVITY LIMITATIONS: carrying, lifting, bending, standing, and locomotion level  PARTICIPATION LIMITATIONS: cleaning, shopping, and community activity  PERSONAL FACTORS: Age, Fitness, Past/current experiences, Time since onset of injury/illness/exacerbation, and 3 co-morbidities: OA, cervical surgery, seizures are also affecting patient's functional outcome.   REHAB POTENTIAL: Fair    CLINICAL DECISION MAKING: Evolving/moderate complexity  EVALUATION COMPLEXITY: Moderate   GOALS: Goals reviewed with patient? No  SHORT TERM GOALS:  Target date: 07/09/22  Pt to be I in HEP in order to decrease her pain to no greater than a 7/10 Baseline: Goal status: IN PROGRESS  2.  Pt strength to increase 1/2 grade to allow pt to be able to come sit to stand without assist from her husband Baseline:  Goal status: IN PROGRESS  3.  PT to demonstrate improved body mechanics to decrease stress off of low back.  Baseline:  Goal status: IN PROGRESS    Long Term Goals: Pt to be I in an advanced HEP to be able to decrease low back pain to no greater than a 5/10 Baseline:  Goal status: IN PROGRESS  2.  Pt strength to be increased one grade to be able to come sit to stand without UE assist  Baseline:  Goal  status: IN PROGRESS  3.  PT to be able to single leg stance for  5 seconds to reduce risk of falling  Baseline:  Goal status: IN PROGRESS   PLAN:  PT FREQUENCY: 2x/week  PT DURATION: 6 weeks  PLANNED INTERVENTIONS: Therapeutic exercises, Neuromuscular re-education, Balance training, Gait training, Patient/Family education, Self Care, Joint mobilization, and Manual therapy.  PLAN FOR NEXT SESSION: begin lumbar stabilization program and Optometrist training.   9:02 AM, 07/06/22 Georges Lynch PT DPT  Physical Therapist with [facility]  [phone]

## 2022-07-08 ENCOUNTER — Encounter (HOSPITAL_COMMUNITY): Payer: Medicare HMO | Admitting: Physical Therapy

## 2022-07-08 DIAGNOSIS — H353112 Nonexudative age-related macular degeneration, right eye, intermediate dry stage: Secondary | ICD-10-CM | POA: Diagnosis not present

## 2022-07-08 DIAGNOSIS — H353221 Exudative age-related macular degeneration, left eye, with active choroidal neovascularization: Secondary | ICD-10-CM | POA: Diagnosis not present

## 2022-07-08 DIAGNOSIS — H353122 Nonexudative age-related macular degeneration, left eye, intermediate dry stage: Secondary | ICD-10-CM | POA: Diagnosis not present

## 2022-07-08 DIAGNOSIS — H35722 Serous detachment of retinal pigment epithelium, left eye: Secondary | ICD-10-CM | POA: Diagnosis not present

## 2022-07-09 DIAGNOSIS — M5136 Other intervertebral disc degeneration, lumbar region: Secondary | ICD-10-CM | POA: Diagnosis not present

## 2022-07-09 DIAGNOSIS — M4316 Spondylolisthesis, lumbar region: Secondary | ICD-10-CM | POA: Diagnosis not present

## 2022-07-09 DIAGNOSIS — E1100 Type 2 diabetes mellitus with hyperosmolarity without nonketotic hyperglycemic-hyperosmolar coma (NKHHC): Secondary | ICD-10-CM | POA: Diagnosis not present

## 2022-07-09 DIAGNOSIS — I10 Essential (primary) hypertension: Secondary | ICD-10-CM | POA: Diagnosis not present

## 2022-07-09 DIAGNOSIS — F32A Depression, unspecified: Secondary | ICD-10-CM | POA: Diagnosis not present

## 2022-07-09 DIAGNOSIS — R569 Unspecified convulsions: Secondary | ICD-10-CM | POA: Diagnosis not present

## 2022-07-09 DIAGNOSIS — F419 Anxiety disorder, unspecified: Secondary | ICD-10-CM | POA: Diagnosis not present

## 2022-07-09 DIAGNOSIS — Z01818 Encounter for other preprocedural examination: Secondary | ICD-10-CM | POA: Diagnosis not present

## 2022-07-09 DIAGNOSIS — E785 Hyperlipidemia, unspecified: Secondary | ICD-10-CM | POA: Diagnosis not present

## 2022-07-13 ENCOUNTER — Ambulatory Visit (HOSPITAL_COMMUNITY): Payer: Medicare HMO | Admitting: Physical Therapy

## 2022-07-13 DIAGNOSIS — M5459 Other low back pain: Secondary | ICD-10-CM | POA: Diagnosis not present

## 2022-07-13 DIAGNOSIS — M79672 Pain in left foot: Secondary | ICD-10-CM | POA: Diagnosis not present

## 2022-07-13 DIAGNOSIS — M6281 Muscle weakness (generalized): Secondary | ICD-10-CM | POA: Diagnosis not present

## 2022-07-13 DIAGNOSIS — M792 Neuralgia and neuritis, unspecified: Secondary | ICD-10-CM | POA: Diagnosis not present

## 2022-07-13 DIAGNOSIS — M79671 Pain in right foot: Secondary | ICD-10-CM | POA: Diagnosis not present

## 2022-07-13 DIAGNOSIS — E1140 Type 2 diabetes mellitus with diabetic neuropathy, unspecified: Secondary | ICD-10-CM | POA: Diagnosis not present

## 2022-07-13 NOTE — Therapy (Signed)
OUTPATIENT PHYSICAL THERAPY THORACOLUMBAR TREATMENT   Patient Name: Maria Esparza MRN: [id number] DOB:1946-02-28, 77 y.o., female Today's Date: 07/13/2022  END OF SESSION:  PT End of Session - 07/13/22 0817     Visit Number 5    Number of Visits 12    Date for PT Re-Evaluation 07/30/22    Authorization Type [employer] medicare    Progress Note Due on Visit 10    PT Start Time 0818    PT Stop Time 0857    PT Time Calculation (min) 39 min    Activity Tolerance Patient tolerated treatment well    Behavior During Therapy [facility] for tasks assessed/performed               Past Medical History:  Diagnosis Date   Anemia    Arthritis    Asthma    mild   Cough    Diabetes mellitus type 2, uncomplicated (HCC)    x 10 yrs   Diabetic neuropathy (HCC) rt foot   Headache(784.0)    Hypercholesteremia    Hyperlipidemia    Hypertension    Hypertriglyceridemia 2002   IBS (irritable bowel syndrome)    Neuropathy    Seizures (HCC)    last sz 01/03/2019   Past Surgical History:  Procedure Laterality Date   APPENDECTOMY     BREAST SURGERY  1999   left breast biopsy   CERVICAL SPINE SURGERY  december 2003   COLON SURGERY  1994   for unknown reason.  They thought she had a mass.   COLONOSCOPY  12/04/2010   Procedure: COLONOSCOPY;  Surgeon: Malissa Hippo, MD;  [facility];  Service: Endoscopy;  Laterality: N/A;  3:00   COLONOSCOPY N/A 01/18/2015   Procedure: COLONOSCOPY;  Surgeon: Malissa Hippo, MD;  [facility];  Service: Endoscopy;  Laterality: N/A;  955 - moved to 10:40 - Ann notified pt   Patient Active Problem List   Diagnosis Date Noted   Cholelithiasis 04/10/2020   Sepsis from Urinary Source 10/30/2018   Acute pyelonephritis 10/30/2018   DM (diabetes mellitus) (HCC) 10/30/2018   Diarrhea 03/31/2012   Hypertension    Hypercholesteremia    Hyperlipidemia     PCP: Nita Sells  REFERRING PROVIDER: Gwyneth Sprout, MD  REFERRING DIAG:  degenerative disc disease, lumbar spondylolisthesis of lumbar region  Rationale for Evaluation and Treatment: Rehabilitation  THERAPY DIAG:  Low back pain Muscle weakness  ONSET DATE: chronic  SUBJECTIVE STATEMENT: Patient reports she fell on Friday and her hips still hurt. She was leaning into her closet and fell.   Eval:PT states that she has seizures.  States she has fallen frequently.  She has had chronic back pain but the pain has increased for the past six months and it is almost intolerable at this time.  Pt states that her back pain is bilateral and there is no radicular sx.  Pain occurs quickly with weight bearing.  Pt states that she has surgery scheduled for her back on June 3rd.   PERTINENT HISTORY:  Cervical surgery, DM, HTN  PAIN:  Are you having pain? Yes: NPRS scale: 4/10 Pain location: low back Pain description: aching  Aggravating factors: weight bearing  Relieving factors: sitting   PRECAUTIONS: None  WEIGHT BEARING RESTRICTIONS: No  FALLS:  Has patient fallen in last 6 months? No  LIVING ENVIRONMENT: Lives with: lives with their family Lives in: House/apartment Stairs:  does not need to use them  Has following equipment at home: Single point cane; she will use the walker if she has to walk for any distance.   OCCUPATION: retired  PLOF: Independent  PATIENT GOALS: less pain to be able to stand/walk longer without pain  NEXT MD VISIT: May 3  OBJECTIVE:   DIAGNOSTIC FINDINGS:  IMPRESSION: 1. Right paracentral protrusion T12-L1 without significant spinal stenosis. 2. Mild multifactorial spinal stenosis L1-2. 3. Moderate multifactorial spinal stenosis and left foraminal stenosis L2-3. 4. Moderate-severe multifactorial spinal stenosis and left foraminal stenosis  L3-4. 5. Mild multifactorial spinal stenosis L4-5 with left foraminal encroachment. 6. Cholelithiasis. 7.  Aortic Atherosclerosis (ICD10-I70.0)  PATIENT SURVEYS:  FOTO 38   COGNITION: Overall cognitive status: Within functional limits for tasks assessed   POSTURE: rounded shoulders and flexed trunk   LUMBAR ROM:   AROM eval  Flexion Hands to mid shin increased pain with return more painful than going down   Extension 0; pt starts in 20 degree forward bend   Right lateral flexion   Left lateral flexion   Right rotation   Left rotation    (Blank rows = not tested)    LOWER EXTREMITY MMT:    MMT Right eval Left eval  Hip flexion 4- 4-  Hip extension 2+ 2+  Hip abduction 4 3-  Hip adduction    Hip internal rotation    Hip external rotation    Knee flexion 4- 4-  Knee extension 4+ 3+  Ankle dorsiflexion 3+ 3+  Ankle plantarflexion    Ankle inversion    Ankle eversion     (Blank rows = not tested) FUNCTIONAL TESTS:  30 seconds chair stand test:  5; 6 is poor for age and sex   2 minute walk test: 226 with cane and increased pain; pt unsteady walking.  Single leg stance:  Rt:  0 Lt:  1"   TODAY'S TREATMENT:  DATE:  07/13/22 Ab brace 10 x 5"  Ab march x20 Bridge 2 x 10 LTR 10 x 5"  SLR x10 each   STS x 10 Semi tandem stance 2 x 20"   07/06/22 Ab brace 10 x 5"  Ab march x20 Bridge 2 x 10 LTR 10 x 5"  SLR x10 each   STS x5 Standing hip abduction x10  07/03/22: Seated: Tall posture Rows with RTB 10x 5" holds STS 5x from 21in height Supine: Educated diaphragmatic breathing x 2 min Abdominal sets 2 sets 10x Bridge 10x 5" Marching with ab set Sidelying: Clam 10x  06/22/22:  Cindy reviewed goals Educated importance of exercise Sitting: Tall posture x 10 Abdominal set x 10 Scapular retraction x 10  Casey Supine: Educated  diaphragmatic breathing x 2 min Abdominal sets 2 sets 10x Glut sets Bridge 10x 3"   PATIENT EDUCATION:  Education details: HEP Patient  Explanation, Verbal cues, and Handouts verbalized understanding  HOME EXERCISE PROGRAM: Access Code: [email or web address] URL: [email or web address] 07/13/22 - Standing Tandem Balance with Counter Support  - 2 x daily - 7 x weekly - 1 sets - 3 reps - 15-20 second hold  07/06/22  - Supine Transversus Abdominis Bracing - Hands on Stomach  - 2 x daily - 7 x weekly - 2 sets - 10 reps - 3-5" hold - Sit to Stand Without Arm Support  - 2 x daily - 7 x weekly - 1-2 sets - 10 reps - Small Range Straight Leg Raise  - 2 x daily - 7 x weekly - 2 sets - 10 reps - Supine March  - 2 x daily - 7 x weekly - 2 sets - 10 reps  Date: 06/18/2022 Prepared by: Virgina Organ  Exercises - Correct Seated Posture  - 3 x daily - 7 x weekly - 1 sets - 10 reps - 3-5" hold - Seated Scapular Retraction  - 3 x daily - 7 x weekly - 1 sets - 10 reps - 3-5" hold - Seated Transversus Abdominis Bracing  - 3 x daily - 7 x weekly - 1 sets - 10 reps - 3-5" hold  06/22/22: Bridge  ASSESSMENT:  CLINICAL IMPRESSION: Activity graded per patient tolerance due to increased soreness per recent fall. No observed decrease in functional level. Introduced semi tandem stance for balance. Patient well challenged with this and requires verbal cues for foot position and upright posturing during. Added to HEP. Patient will continue to benefit from skilled therapy services to reduce remaining deficits and improve functional ability.    OBJECTIVE IMPAIRMENTS: Abnormal gait, decreased activity tolerance, decreased mobility, decreased strength, obesity, and pain.   ACTIVITY LIMITATIONS: carrying, lifting, bending, standing, and locomotion level  PARTICIPATION LIMITATIONS: cleaning, shopping, and community activity  PERSONAL FACTORS: Age, Fitness, Past/current experiences, Time since onset of  injury/illness/exacerbation, and 3 co-morbidities: OA, cervical surgery, seizures are also affecting patient's functional outcome.   REHAB POTENTIAL: Fair    CLINICAL DECISION MAKING: Evolving/moderate complexity  EVALUATION COMPLEXITY: Moderate   GOALS: Goals reviewed with patient? No  SHORT TERM GOALS: Target date: 07/09/22  Pt to be I in HEP in order to decrease her pain to no greater than a 7/10 Baseline: Goal status: IN PROGRESS  2.  Pt strength to increase 1/2 grade to allow pt to be able to come sit to stand without assist from her husband Baseline:  Goal status: IN PROGRESS  3.  PT to demonstrate improved body mechanics to decrease stress off of low  back.  Baseline:  Goal status: IN PROGRESS    Long Term Goals: Pt to be I in an advanced HEP to be able to decrease low back pain to no greater than a 5/10 Baseline:  Goal status: IN PROGRESS  2.  Pt strength to be increased one grade to be able to come sit to stand without UE assist  Baseline:  Goal status: IN PROGRESS  3.  PT to be able to single leg stance for  5 seconds to reduce risk of falling  Baseline:  Goal status: IN PROGRESS   PLAN:  PT FREQUENCY: 2x/week  PT DURATION: 6 weeks  PLANNED INTERVENTIONS: Therapeutic exercises, Neuromuscular re-education, Balance training, Gait training, Patient/Family education, Self Care, Joint mobilization, and Manual therapy.  PLAN FOR NEXT SESSION: begin lumbar stabilization program and Optometrist training.   8:58 AM, 07/13/22 Georges Lynch PT DPT  Physical Therapist with [facility]  [phone]

## 2022-07-15 ENCOUNTER — Ambulatory Visit (HOSPITAL_COMMUNITY): Payer: Medicare HMO | Admitting: Physical Therapy

## 2022-07-15 DIAGNOSIS — M6281 Muscle weakness (generalized): Secondary | ICD-10-CM

## 2022-07-15 DIAGNOSIS — M5459 Other low back pain: Secondary | ICD-10-CM

## 2022-07-15 NOTE — Therapy (Signed)
OUTPATIENT PHYSICAL THERAPY THORACOLUMBAR TREATMENT   Patient Name: Maria Esparza MRN: [id number] DOB:[birth year], 77 y.o., female Today's Date: 07/15/2022  END OF SESSION:  PT End of Session - 07/15/22 0807     Visit Number 6    Number of Visits 12    Date for PT Re-Evaluation 07/30/22    Authorization Type [employer] medicare    Progress Note Due on Visit 10    PT Start Time 0816    PT Stop Time 0855    PT Time Calculation (min) 39 min    Activity Tolerance Patient tolerated treatment well    Behavior During Therapy [facility] for tasks assessed/performed               Past Medical History:  Diagnosis Date   Anemia    Arthritis    Asthma    mild   Cough    Diabetes mellitus type 2, uncomplicated (HCC)    x 10 yrs   Diabetic neuropathy (HCC) rt foot   Headache(784.0)    Hypercholesteremia    Hyperlipidemia    Hypertension    Hypertriglyceridemia 2002   IBS (irritable bowel syndrome)    Neuropathy    Seizures (HCC)    last sz 01/03/2019   Past Surgical History:  Procedure Laterality Date   APPENDECTOMY     BREAST SURGERY  1999   left breast biopsy   CERVICAL SPINE SURGERY  december 2003   COLON SURGERY  1994   for unknown reason.  They thought she had a mass.   COLONOSCOPY  12/04/2010   Procedure: COLONOSCOPY;  Surgeon: Malissa Hippo, MD;  [facility];  Service: Endoscopy;  Laterality: N/A;  3:00   COLONOSCOPY N/A 01/18/2015   Procedure: COLONOSCOPY;  Surgeon: Malissa Hippo, MD;  [facility];  Service: Endoscopy;  Laterality: N/A;  955 - moved to 10:40 - Ann notified pt   Patient Active Problem List   Diagnosis Date Noted   Cholelithiasis 04/10/2020   Sepsis from Urinary Source 10/30/2018   Acute pyelonephritis 10/30/2018   DM (diabetes mellitus) (HCC) 10/30/2018   Diarrhea 03/31/2012   Hypertension    Hypercholesteremia    Hyperlipidemia     PCP: Nita Sells  REFERRING PROVIDER: Gwyneth Sprout, MD  REFERRING DIAG:  degenerative disc disease, lumbar spondylolisthesis of lumbar region  Rationale for Evaluation and Treatment: Rehabilitation  THERAPY DIAG:  Low back pain Muscle weakness  ONSET DATE: chronic  SUBJECTIVE STATEMENT: Hips are feeling better. Back is good when sitting, standing and moving is still bothersome.   Eval:PT states that she has seizures.  States she has fallen frequently.  She has had chronic back pain but the pain has increased for the past six months and it is almost intolerable at this time.  Pt states that her back pain is bilateral and there is no radicular sx.  Pain occurs quickly with weight bearing.  Pt states that she has surgery scheduled for her back on June 3rd.   PERTINENT HISTORY:  Cervical surgery, DM, HTN  PAIN:  Are you having pain? Yes: NPRS scale: 4/10 Pain location: low back Pain description: aching  Aggravating factors: weight bearing  Relieving factors: sitting   PRECAUTIONS: None  WEIGHT BEARING RESTRICTIONS: No  FALLS:  Has patient fallen in last 6 months? No  LIVING ENVIRONMENT: Lives with: lives with their family Lives in: House/apartment Stairs:  does not need to use them  Has following equipment at home: Single point cane; she will use the walker if she has to walk for any distance.   OCCUPATION: retired  PLOF: Independent  PATIENT GOALS: less pain to be able to stand/walk longer without pain  NEXT MD VISIT: May 3  OBJECTIVE:   DIAGNOSTIC FINDINGS:  IMPRESSION: 1. Right paracentral protrusion T12-L1 without significant spinal stenosis. 2. Mild multifactorial spinal stenosis L1-2. 3. Moderate multifactorial spinal stenosis and left foraminal stenosis L2-3. 4. Moderate-severe multifactorial spinal stenosis and left foraminal stenosis L3-4. 5. Mild  multifactorial spinal stenosis L4-5 with left foraminal encroachment. 6. Cholelithiasis. 7.  Aortic Atherosclerosis (ICD10-I70.0)  PATIENT SURVEYS:  FOTO 38   COGNITION: Overall cognitive status: Within functional limits for tasks assessed   POSTURE: rounded shoulders and flexed trunk   LUMBAR ROM:   AROM eval  Flexion Hands to mid shin increased pain with return more painful than going down   Extension 0; pt starts in 20 degree forward bend   Right lateral flexion   Left lateral flexion   Right rotation   Left rotation    (Blank rows = not tested)    LOWER EXTREMITY MMT:    MMT Right eval Left eval  Hip flexion 4- 4-  Hip extension 2+ 2+  Hip abduction 4 3-  Hip adduction    Hip internal rotation    Hip external rotation    Knee flexion 4- 4-  Knee extension 4+ 3+  Ankle dorsiflexion 3+ 3+  Ankle plantarflexion    Ankle inversion    Ankle eversion     (Blank rows = not tested) FUNCTIONAL TESTS:  30 seconds chair stand test:  5; 6 is poor for age and sex   2 minute walk test: 226 with cane and increased pain; pt unsteady walking.  Single leg stance:  Rt:  0 Lt:  1"   TODAY'S TREATMENT:  DATE:  07/15/22 Ab brace 10 x 5"  Ab march x20 Bridge 2 x 10 LTR 10 x 5"  SLR x10 each  Clamshell x 10 each Sidelying leg raise x 10 each     STS x 10 Semi tandem stance 3 x 20"   07/13/22 Ab brace 10 x 5"  Ab march x20 Bridge 2 x 10 LTR 10 x 5"  SLR x10 each   STS x 10 Semi tandem stance 2 x 20"   07/06/22 Ab brace 10 x 5"  Ab march x20 Bridge 2 x 10 LTR 10 x 5"  SLR x10 each   STS x5 Standing hip abduction x10 PATIENT EDUCATION:  Education details: HEP Patient  Explanation, Verbal cues, and Handouts verbalized understanding  HOME EXERCISE PROGRAM: Access Code: [email or web address] URL: [email or web address] 07/15/22 -  Clamshell  - 2 x daily - 7 x weekly - 2 sets - 10 reps - Sidelying Hip Abduction  - 2 x daily - 7 x weekly - 2 sets - 10 reps  07/13/22 - Standing Tandem Balance with Counter Support  - 2 x daily - 7 x weekly - 1 sets - 3 reps - 15-20 second hold  07/06/22  - Supine Transversus Abdominis Bracing - Hands on Stomach  - 2 x daily - 7 x weekly - 2 sets - 10 reps - 3-5" hold - Sit to Stand Without Arm Support  - 2 x daily - 7 x weekly - 1-2 sets - 10 reps - Small Range Straight Leg Raise  - 2 x daily - 7 x weekly - 2 sets - 10 reps - Supine March  - 2 x daily - 7 x weekly - 2 sets - 10 reps  Date: 06/18/2022 Prepared by: Virgina Organ  Exercises - Correct Seated Posture  - 3 x daily - 7 x weekly - 1 sets - 10 reps - 3-5" hold - Seated Scapular Retraction  - 3 x daily - 7 x weekly - 1 sets - 10 reps - 3-5" hold - Seated Transversus Abdominis Bracing  - 3 x daily - 7 x weekly - 1 sets - 10 reps - 3-5" hold  06/22/22: Bridge  ASSESSMENT:  CLINICAL IMPRESSION: Added clamshell and sidelying hip abduction for LE strength progression. Patient educated on purpose and function of each. Added to HEP. Patient continues to be challenged with balance and demos LOB x 1 during semi tandem stance, corrected with therapist assist. Patient will continue to benefit from skilled therapy services to reduce remaining deficits and improve functional ability.    OBJECTIVE IMPAIRMENTS: Abnormal gait, decreased activity tolerance, decreased mobility, decreased strength, obesity, and pain.   ACTIVITY LIMITATIONS: carrying, lifting, bending, standing, and locomotion level  PARTICIPATION LIMITATIONS: cleaning, shopping, and community activity  PERSONAL FACTORS: Age, Fitness, Past/current experiences, Time since onset of injury/illness/exacerbation, and 3 co-morbidities: OA, cervical surgery, seizures are also affecting patient's functional outcome.   REHAB POTENTIAL: Fair    CLINICAL DECISION MAKING:  Evolving/moderate complexity  EVALUATION COMPLEXITY: Moderate   GOALS: Goals reviewed with patient? No  SHORT TERM GOALS: Target date: 07/09/22  Pt to be I in HEP in order to decrease her pain to no greater than a 7/10 Baseline: Goal status: IN PROGRESS  2.  Pt strength to increase 1/2 grade to allow pt to be able to come sit to stand without assist from her husband Baseline:  Goal status: IN PROGRESS  3.  PT to demonstrate improved body mechanics to decrease stress off  of low back.  Baseline:  Goal status: IN PROGRESS    Long Term Goals: Pt to be I in an advanced HEP to be able to decrease low back pain to no greater than a 5/10 Baseline:  Goal status: IN PROGRESS  2.  Pt strength to be increased one grade to be able to come sit to stand without UE assist  Baseline:  Goal status: IN PROGRESS  3.  PT to be able to single leg stance for  5 seconds to reduce risk of falling  Baseline:  Goal status: IN PROGRESS   PLAN:  PT FREQUENCY: 2x/week  PT DURATION: 6 weeks  PLANNED INTERVENTIONS: Therapeutic exercises, Neuromuscular re-education, Balance training, Gait training, Patient/Family education, Self Care, Joint mobilization, and Manual therapy.  PLAN FOR NEXT SESSION: begin lumbar stabilization program and Optometrist training. Balance.   8:07 AM, 07/15/22 Georges Lynch PT DPT  Physical Therapist with [facility]  [phone]

## 2022-07-16 DIAGNOSIS — H353221 Exudative age-related macular degeneration, left eye, with active choroidal neovascularization: Secondary | ICD-10-CM | POA: Diagnosis not present

## 2022-07-20 ENCOUNTER — Encounter (HOSPITAL_COMMUNITY): Payer: Self-pay

## 2022-07-20 ENCOUNTER — Ambulatory Visit (HOSPITAL_COMMUNITY): Payer: Medicare HMO

## 2022-07-20 DIAGNOSIS — M6281 Muscle weakness (generalized): Secondary | ICD-10-CM | POA: Diagnosis not present

## 2022-07-20 DIAGNOSIS — M5459 Other low back pain: Secondary | ICD-10-CM

## 2022-07-20 NOTE — Therapy (Signed)
OUTPATIENT PHYSICAL THERAPY THORACOLUMBAR TREATMENT   Patient Name: Maria Esparza MRN: [id number] DOB:1946-02-14, 77 y.o., female Today's Date: 07/20/2022  END OF SESSION:  PT End of Session - 07/20/22 0917     Visit Number 7    Number of Visits 12    Date for PT Re-Evaluation 07/30/22    Authorization Type [employer] medicare    Progress Note Due on Visit 10    PT Start Time 0821    PT Stop Time 0910    PT Time Calculation (min) 49 min    Activity Tolerance Patient tolerated treatment well    Behavior During Therapy [facility] for tasks assessed/performed                Past Medical History:  Diagnosis Date   Anemia    Arthritis    Asthma    mild   Cough    Diabetes mellitus type 2, uncomplicated (HCC)    x 10 yrs   Diabetic neuropathy (HCC) rt foot   Headache(784.0)    Hypercholesteremia    Hyperlipidemia    Hypertension    Hypertriglyceridemia 2002   IBS (irritable bowel syndrome)    Neuropathy    Seizures (HCC)    last sz 01/03/2019   Past Surgical History:  Procedure Laterality Date   APPENDECTOMY     BREAST SURGERY  1999   left breast biopsy   CERVICAL SPINE SURGERY  december 2003   COLON SURGERY  1994   for unknown reason.  They thought she had a mass.   COLONOSCOPY  12/04/2010   Procedure: COLONOSCOPY;  Surgeon: Malissa Hippo, MD;  [facility];  Service: Endoscopy;  Laterality: N/A;  3:00   COLONOSCOPY N/A 01/18/2015   Procedure: COLONOSCOPY;  Surgeon: Malissa Hippo, MD;  Location: [facility];  Service: Endoscopy;  Laterality: N/A;  955 - moved to 10:40 - Ann notified pt   Patient Active Problem List   Diagnosis Date Noted   Cholelithiasis 04/10/2020   Sepsis from Urinary Source 10/30/2018   Acute pyelonephritis 10/30/2018   DM (diabetes mellitus) (HCC) 10/30/2018   Diarrhea 03/31/2012   Hypertension    Hypercholesteremia    Hyperlipidemia     PCP: Nita Sells  REFERRING PROVIDER: Gwyneth Sprout, MD  REFERRING DIAG:  degenerative disc disease, lumbar spondylolisthesis of lumbar region  Rationale for Evaluation and Treatment: Rehabilitation  THERAPY DIAG:  Low back pain Muscle weakness  ONSET DATE: chronic  SUBJECTIVE STATEMENT: Pt stated she hasn't done much today so feeling okay.  Reports increased pain with movements.  Pt stated she is getting nervous as surgery is scheduled for 08/03/22.  Eval:PT states that she has seizures.  States she has fallen frequently.  She has had chronic back pain but the pain has increased for the past six months and it is almost intolerable at this time.  Pt states that her back pain is bilateral and there is no radicular sx.  Pain occurs quickly with weight bearing.  Pt states that she has surgery scheduled for her back on June 3rd.   PERTINENT HISTORY:  Cervical surgery, DM, HTN  PAIN:  Are you having pain? Yes: NPRS scale: 2/10 Pain location: low back Pain description: aching  Aggravating factors: weight bearing  Relieving factors: sitting   PRECAUTIONS: None  WEIGHT BEARING RESTRICTIONS: No  FALLS:  Has patient fallen in last 6 months? No  LIVING ENVIRONMENT: Lives with: lives with their family Lives in: House/apartment Stairs:  does not need to use them  Has following equipment at home: Single point cane; she will use the walker if she has to walk for any distance.   OCCUPATION: retired  PLOF: Independent  PATIENT GOALS: less pain to be able to stand/walk longer without pain  NEXT MD VISIT: May 3  OBJECTIVE:   DIAGNOSTIC FINDINGS:  IMPRESSION: 1. Right paracentral protrusion T12-L1 without significant spinal stenosis. 2. Mild multifactorial spinal stenosis L1-2. 3. Moderate multifactorial spinal stenosis and left foraminal stenosis L2-3. 4. Moderate-severe  multifactorial spinal stenosis and left foraminal stenosis L3-4. 5. Mild multifactorial spinal stenosis L4-5 with left foraminal encroachment. 6. Cholelithiasis. 7.  Aortic Atherosclerosis (ICD10-I70.0)  PATIENT SURVEYS:  FOTO 38   COGNITION: Overall cognitive status: Within functional limits for tasks assessed   POSTURE: rounded shoulders and flexed trunk   LUMBAR ROM:   AROM eval  Flexion Hands to mid shin increased pain with return more painful than going down   Extension 0; pt starts in 20 degree forward bend   Right lateral flexion   Left lateral flexion   Right rotation   Left rotation    (Blank rows = not tested)    LOWER EXTREMITY MMT:    MMT Right eval Left eval  Hip flexion 4- 4-  Hip extension 2+ 2+  Hip abduction 4 3-  Hip adduction    Hip internal rotation    Hip external rotation    Knee flexion 4- 4-  Knee extension 4+ 3+  Ankle dorsiflexion 3+ 3+  Ankle plantarflexion    Ankle inversion    Ankle eversion     (Blank rows = not tested) FUNCTIONAL TESTS:  30 seconds chair stand test:  5; 6 is poor for age and sex   2 minute walk test: 226 with cane and increased pain; pt unsteady walking.  Single leg stance:  Rt:  0 Lt:  1"   TODAY'S TREATMENT:  DATE:  07/20/22: STS 10x eccentric control Sidestep 3RT // bars Semi tandem stance 3x 30"  Log rolling instructed Ab march x20 Bridge 2 x 10    07/15/22 Ab brace 10 x 5"  Ab march x20 Bridge 2 x 10 LTR 10 x 5"  SLR x10 each  Clamshell x 10 each Sidelying leg raise x 10 each     STS x 10 Semi tandem stance 3 x 20"   07/13/22 Ab brace 10 x 5"  Ab march x20 Bridge 2 x 10 LTR 10 x 5"  SLR x10 each   STS x 10 Semi tandem stance 2 x 20"   07/06/22 Ab brace 10 x 5"  Ab march x20 Bridge 2 x 10 LTR 10 x 5"  SLR x10 each   STS x5 Standing hip abduction  x10 PATIENT EDUCATION:  Education details: HEP Patient  Explanation, Verbal cues, and Handouts verbalized understanding  HOME EXERCISE PROGRAM: Access Code: [email or web address] URL: [email or web address] 07/15/22 - Clamshell  - 2 x daily - 7 x weekly - 2 sets - 10 reps - Sidelying Hip Abduction  - 2 x daily - 7 x weekly - 2 sets - 10 reps  07/13/22 - Standing Tandem Balance with Counter Support  - 2 x daily - 7 x weekly - 1 sets - 3 reps - 15-20 second hold  07/06/22  - Supine Transversus Abdominis Bracing - Hands on Stomach  - 2 x daily - 7 x weekly - 2 sets - 10 reps - 3-5" hold - Sit to Stand Without Arm Support  - 2 x daily - 7 x weekly - 1-2 sets - 10 reps - Small Range Straight Leg Raise  - 2 x daily - 7 x weekly - 2 sets - 10 reps - Supine March  - 2 x daily - 7 x weekly - 2 sets - 10 reps  Date: 06/18/2022 Prepared by: Virgina Organ  Exercises - Correct Seated Posture  - 3 x daily - 7 x weekly - 1 sets - 10 reps - 3-5" hold - Seated Scapular Retraction  - 3 x daily - 7 x weekly - 1 sets - 10 reps - 3-5" hold - Seated Transversus Abdominis Bracing  - 3 x daily - 7 x weekly - 1 sets - 10 reps - 3-5" hold  06/22/22: Bridge  ASSESSMENT:  CLINICAL IMPRESSION:  Added sidestepping for gluteal strengthening to assist with balance.  Continued core and proximal strengthening with cueing to improve lumbar stability.  Reviewed proper mechanics with bed mobility, added mechanics to HEP to improve compliance.  Pt stated through session she is nervous with surgery planned on 08/03/22, encouraged to write list of questions to ask prior.  No reports of pain through session.    OBJECTIVE IMPAIRMENTS: Abnormal gait, decreased activity tolerance, decreased mobility, decreased strength, obesity, and pain.   ACTIVITY LIMITATIONS: carrying, lifting, bending, standing, and locomotion level  PARTICIPATION LIMITATIONS: cleaning, shopping, and community activity  PERSONAL FACTORS: Age,  Fitness, Past/current experiences, Time since onset of injury/illness/exacerbation, and 3 co-morbidities: OA, cervical surgery, seizures are also affecting patient's functional outcome.   REHAB POTENTIAL: Fair    CLINICAL DECISION MAKING: Evolving/moderate complexity  EVALUATION COMPLEXITY: Moderate   GOALS: Goals reviewed with patient? No  SHORT TERM GOALS: Target date: 07/09/22  Pt to be I in HEP in order to decrease her pain to no greater than a 7/10 Baseline: Goal status: IN PROGRESS  2.  Pt strength to increase 1/2 grade  to allow pt to be able to come sit to stand without assist from her husband Baseline:  Goal status: IN PROGRESS  3.  PT to demonstrate improved body mechanics to decrease stress off of low back.  Baseline:  Goal status: IN PROGRESS    Long Term Goals: Pt to be I in an advanced HEP to be able to decrease low back pain to no greater than a 5/10 Baseline:  Goal status: IN PROGRESS  2.  Pt strength to be increased one grade to be able to come sit to stand without UE assist  Baseline:  Goal status: IN PROGRESS  3.  PT to be able to single leg stance for  5 seconds to reduce risk of falling  Baseline:  Goal status: IN PROGRESS   PLAN:  PT FREQUENCY: 2x/week  PT DURATION: 6 weeks  PLANNED INTERVENTIONS: Therapeutic exercises, Neuromuscular re-education, Balance training, Gait training, Patient/Family education, Self Care, Joint mobilization, and Manual therapy.  PLAN FOR NEXT SESSION: begin lumbar stabilization program and Optometrist training. Balance.   Becky Sax, LPTA/CLT; CBIS [phone]  9:18 AM, 07/20/22

## 2022-07-22 DIAGNOSIS — R569 Unspecified convulsions: Secondary | ICD-10-CM | POA: Diagnosis not present

## 2022-07-23 ENCOUNTER — Encounter (HOSPITAL_COMMUNITY): Payer: Medicare HMO

## 2022-07-24 DIAGNOSIS — E1165 Type 2 diabetes mellitus with hyperglycemia: Secondary | ICD-10-CM | POA: Diagnosis not present

## 2022-07-29 ENCOUNTER — Encounter (HOSPITAL_COMMUNITY): Payer: Medicare HMO

## 2022-08-03 DIAGNOSIS — E119 Type 2 diabetes mellitus without complications: Secondary | ICD-10-CM | POA: Diagnosis not present

## 2022-08-03 DIAGNOSIS — M5416 Radiculopathy, lumbar region: Secondary | ICD-10-CM | POA: Diagnosis not present

## 2022-08-03 DIAGNOSIS — M25462 Effusion, left knee: Secondary | ICD-10-CM | POA: Diagnosis not present

## 2022-08-03 DIAGNOSIS — W19XXXA Unspecified fall, initial encounter: Secondary | ICD-10-CM | POA: Diagnosis not present

## 2022-08-03 DIAGNOSIS — D62 Acute posthemorrhagic anemia: Secondary | ICD-10-CM | POA: Diagnosis not present

## 2022-08-03 DIAGNOSIS — E6609 Other obesity due to excess calories: Secondary | ICD-10-CM | POA: Diagnosis not present

## 2022-08-03 DIAGNOSIS — N179 Acute kidney failure, unspecified: Secondary | ICD-10-CM | POA: Diagnosis not present

## 2022-08-03 DIAGNOSIS — R4182 Altered mental status, unspecified: Secondary | ICD-10-CM | POA: Diagnosis not present

## 2022-08-03 DIAGNOSIS — J9811 Atelectasis: Secondary | ICD-10-CM | POA: Diagnosis not present

## 2022-08-03 DIAGNOSIS — J9691 Respiratory failure, unspecified with hypoxia: Secondary | ICD-10-CM | POA: Diagnosis not present

## 2022-08-03 DIAGNOSIS — M5136 Other intervertebral disc degeneration, lumbar region: Secondary | ICD-10-CM | POA: Diagnosis not present

## 2022-08-03 DIAGNOSIS — K5989 Other specified functional intestinal disorders: Secondary | ICD-10-CM | POA: Diagnosis not present

## 2022-08-03 DIAGNOSIS — Z6839 Body mass index (BMI) 39.0-39.9, adult: Secondary | ICD-10-CM | POA: Diagnosis not present

## 2022-08-03 DIAGNOSIS — R0902 Hypoxemia: Secondary | ICD-10-CM | POA: Diagnosis not present

## 2022-08-03 DIAGNOSIS — M48061 Spinal stenosis, lumbar region without neurogenic claudication: Secondary | ICD-10-CM | POA: Diagnosis not present

## 2022-08-03 DIAGNOSIS — I959 Hypotension, unspecified: Secondary | ICD-10-CM | POA: Diagnosis not present

## 2022-08-03 DIAGNOSIS — J986 Disorders of diaphragm: Secondary | ICD-10-CM | POA: Diagnosis not present

## 2022-08-03 DIAGNOSIS — J9602 Acute respiratory failure with hypercapnia: Secondary | ICD-10-CM | POA: Diagnosis not present

## 2022-08-03 DIAGNOSIS — K5939 Other megacolon: Secondary | ICD-10-CM | POA: Diagnosis not present

## 2022-08-03 DIAGNOSIS — M4316 Spondylolisthesis, lumbar region: Secondary | ICD-10-CM | POA: Diagnosis not present

## 2022-08-03 DIAGNOSIS — I351 Nonrheumatic aortic (valve) insufficiency: Secondary | ICD-10-CM | POA: Diagnosis not present

## 2022-08-03 DIAGNOSIS — I517 Cardiomegaly: Secondary | ICD-10-CM | POA: Diagnosis not present

## 2022-08-03 DIAGNOSIS — J9692 Respiratory failure, unspecified with hypercapnia: Secondary | ICD-10-CM | POA: Diagnosis not present

## 2022-08-03 DIAGNOSIS — E871 Hypo-osmolality and hyponatremia: Secondary | ICD-10-CM | POA: Diagnosis not present

## 2022-08-03 DIAGNOSIS — K567 Ileus, unspecified: Secondary | ICD-10-CM | POA: Diagnosis not present

## 2022-08-03 DIAGNOSIS — G928 Other toxic encephalopathy: Secondary | ICD-10-CM | POA: Diagnosis not present

## 2022-08-03 DIAGNOSIS — M5116 Intervertebral disc disorders with radiculopathy, lumbar region: Secondary | ICD-10-CM | POA: Diagnosis not present

## 2022-08-03 DIAGNOSIS — J439 Emphysema, unspecified: Secondary | ICD-10-CM | POA: Diagnosis not present

## 2022-08-03 DIAGNOSIS — K6389 Other specified diseases of intestine: Secondary | ICD-10-CM | POA: Diagnosis not present

## 2022-08-03 DIAGNOSIS — R410 Disorientation, unspecified: Secondary | ICD-10-CM | POA: Diagnosis not present

## 2022-08-03 DIAGNOSIS — K3189 Other diseases of stomach and duodenum: Secondary | ICD-10-CM | POA: Diagnosis not present

## 2022-08-03 DIAGNOSIS — R339 Retention of urine, unspecified: Secondary | ICD-10-CM | POA: Diagnosis not present

## 2022-08-03 DIAGNOSIS — K8020 Calculus of gallbladder without cholecystitis without obstruction: Secondary | ICD-10-CM | POA: Diagnosis not present

## 2022-08-03 DIAGNOSIS — Z9889 Other specified postprocedural states: Secondary | ICD-10-CM | POA: Diagnosis not present

## 2022-08-03 DIAGNOSIS — Z6841 Body Mass Index (BMI) 40.0 and over, adult: Secondary | ICD-10-CM | POA: Diagnosis not present

## 2022-08-03 DIAGNOSIS — J9601 Acute respiratory failure with hypoxia: Secondary | ICD-10-CM | POA: Diagnosis not present

## 2022-08-03 DIAGNOSIS — R159 Full incontinence of feces: Secondary | ICD-10-CM | POA: Diagnosis not present

## 2022-08-03 DIAGNOSIS — R152 Fecal urgency: Secondary | ICD-10-CM | POA: Diagnosis not present

## 2022-08-03 DIAGNOSIS — Z4659 Encounter for fitting and adjustment of other gastrointestinal appliance and device: Secondary | ICD-10-CM | POA: Diagnosis not present

## 2022-08-03 DIAGNOSIS — M1712 Unilateral primary osteoarthritis, left knee: Secondary | ICD-10-CM | POA: Diagnosis not present

## 2022-08-05 DIAGNOSIS — R0902 Hypoxemia: Secondary | ICD-10-CM | POA: Diagnosis not present

## 2022-08-05 DIAGNOSIS — I517 Cardiomegaly: Secondary | ICD-10-CM | POA: Diagnosis not present

## 2022-08-05 DIAGNOSIS — I351 Nonrheumatic aortic (valve) insufficiency: Secondary | ICD-10-CM | POA: Diagnosis not present

## 2022-08-19 DIAGNOSIS — M6281 Muscle weakness (generalized): Secondary | ICD-10-CM | POA: Diagnosis not present

## 2022-08-19 DIAGNOSIS — E785 Hyperlipidemia, unspecified: Secondary | ICD-10-CM | POA: Diagnosis not present

## 2022-08-19 DIAGNOSIS — D649 Anemia, unspecified: Secondary | ICD-10-CM | POA: Diagnosis not present

## 2022-08-19 DIAGNOSIS — R051 Acute cough: Secondary | ICD-10-CM | POA: Diagnosis not present

## 2022-08-19 DIAGNOSIS — J9602 Acute respiratory failure with hypercapnia: Secondary | ICD-10-CM | POA: Diagnosis not present

## 2022-08-19 DIAGNOSIS — Z9181 History of falling: Secondary | ICD-10-CM | POA: Diagnosis not present

## 2022-08-19 DIAGNOSIS — M48061 Spinal stenosis, lumbar region without neurogenic claudication: Secondary | ICD-10-CM | POA: Diagnosis not present

## 2022-08-19 DIAGNOSIS — J9601 Acute respiratory failure with hypoxia: Secondary | ICD-10-CM | POA: Diagnosis not present

## 2022-08-19 DIAGNOSIS — R2681 Unsteadiness on feet: Secondary | ICD-10-CM | POA: Diagnosis not present

## 2022-08-19 DIAGNOSIS — G928 Other toxic encephalopathy: Secondary | ICD-10-CM | POA: Diagnosis not present

## 2022-08-19 DIAGNOSIS — R059 Cough, unspecified: Secondary | ICD-10-CM | POA: Diagnosis not present

## 2022-08-19 DIAGNOSIS — R279 Unspecified lack of coordination: Secondary | ICD-10-CM | POA: Diagnosis not present

## 2022-08-19 DIAGNOSIS — M4316 Spondylolisthesis, lumbar region: Secondary | ICD-10-CM | POA: Diagnosis not present

## 2022-08-19 DIAGNOSIS — R109 Unspecified abdominal pain: Secondary | ICD-10-CM | POA: Diagnosis not present

## 2022-08-19 DIAGNOSIS — J9612 Chronic respiratory failure with hypercapnia: Secondary | ICD-10-CM | POA: Diagnosis not present

## 2022-08-19 DIAGNOSIS — I10 Essential (primary) hypertension: Secondary | ICD-10-CM | POA: Diagnosis not present

## 2022-08-19 DIAGNOSIS — M5116 Intervertebral disc disorders with radiculopathy, lumbar region: Secondary | ICD-10-CM | POA: Diagnosis not present

## 2022-08-19 DIAGNOSIS — D72829 Elevated white blood cell count, unspecified: Secondary | ICD-10-CM | POA: Diagnosis not present

## 2022-08-19 DIAGNOSIS — Z4789 Encounter for other orthopedic aftercare: Secondary | ICD-10-CM | POA: Diagnosis not present

## 2022-08-19 DIAGNOSIS — R569 Unspecified convulsions: Secondary | ICD-10-CM | POA: Diagnosis not present

## 2022-08-19 DIAGNOSIS — R52 Pain, unspecified: Secondary | ICD-10-CM | POA: Diagnosis not present

## 2022-08-19 DIAGNOSIS — R062 Wheezing: Secondary | ICD-10-CM | POA: Diagnosis not present

## 2022-08-19 DIAGNOSIS — G4089 Other seizures: Secondary | ICD-10-CM | POA: Diagnosis not present

## 2022-08-19 DIAGNOSIS — K5900 Constipation, unspecified: Secondary | ICD-10-CM | POA: Diagnosis not present

## 2022-08-19 DIAGNOSIS — M1990 Unspecified osteoarthritis, unspecified site: Secondary | ICD-10-CM | POA: Diagnosis not present

## 2022-08-19 DIAGNOSIS — N289 Disorder of kidney and ureter, unspecified: Secondary | ICD-10-CM | POA: Diagnosis not present

## 2022-08-19 DIAGNOSIS — K567 Ileus, unspecified: Secondary | ICD-10-CM | POA: Diagnosis not present

## 2022-08-19 DIAGNOSIS — R5381 Other malaise: Secondary | ICD-10-CM | POA: Diagnosis not present

## 2022-08-19 DIAGNOSIS — E119 Type 2 diabetes mellitus without complications: Secondary | ICD-10-CM | POA: Diagnosis not present

## 2022-08-19 DIAGNOSIS — M25562 Pain in left knee: Secondary | ICD-10-CM | POA: Diagnosis not present

## 2022-08-19 DIAGNOSIS — E1165 Type 2 diabetes mellitus with hyperglycemia: Secondary | ICD-10-CM | POA: Diagnosis not present

## 2022-08-19 DIAGNOSIS — M5136 Other intervertebral disc degeneration, lumbar region: Secondary | ICD-10-CM | POA: Diagnosis not present

## 2022-08-19 DIAGNOSIS — G4700 Insomnia, unspecified: Secondary | ICD-10-CM | POA: Diagnosis not present

## 2022-08-19 DIAGNOSIS — M5137 Other intervertebral disc degeneration, lumbosacral region: Secondary | ICD-10-CM | POA: Diagnosis not present

## 2022-08-19 DIAGNOSIS — R0989 Other specified symptoms and signs involving the circulatory and respiratory systems: Secondary | ICD-10-CM | POA: Diagnosis not present

## 2022-08-19 DIAGNOSIS — R339 Retention of urine, unspecified: Secondary | ICD-10-CM | POA: Diagnosis not present

## 2022-08-21 DIAGNOSIS — D72829 Elevated white blood cell count, unspecified: Secondary | ICD-10-CM | POA: Diagnosis not present

## 2022-08-21 DIAGNOSIS — R339 Retention of urine, unspecified: Secondary | ICD-10-CM | POA: Diagnosis not present

## 2022-08-21 DIAGNOSIS — K567 Ileus, unspecified: Secondary | ICD-10-CM | POA: Diagnosis not present

## 2022-08-21 DIAGNOSIS — G928 Other toxic encephalopathy: Secondary | ICD-10-CM | POA: Diagnosis not present

## 2022-08-21 DIAGNOSIS — D649 Anemia, unspecified: Secondary | ICD-10-CM | POA: Diagnosis not present

## 2022-08-21 DIAGNOSIS — I10 Essential (primary) hypertension: Secondary | ICD-10-CM | POA: Diagnosis not present

## 2022-08-21 DIAGNOSIS — M5137 Other intervertebral disc degeneration, lumbosacral region: Secondary | ICD-10-CM | POA: Diagnosis not present

## 2022-08-24 DIAGNOSIS — E1165 Type 2 diabetes mellitus with hyperglycemia: Secondary | ICD-10-CM | POA: Diagnosis not present

## 2022-08-25 DIAGNOSIS — R051 Acute cough: Secondary | ICD-10-CM | POA: Diagnosis not present

## 2022-08-25 DIAGNOSIS — E785 Hyperlipidemia, unspecified: Secondary | ICD-10-CM | POA: Diagnosis not present

## 2022-08-25 DIAGNOSIS — K5900 Constipation, unspecified: Secondary | ICD-10-CM | POA: Diagnosis not present

## 2022-08-25 DIAGNOSIS — R569 Unspecified convulsions: Secondary | ICD-10-CM | POA: Diagnosis not present

## 2022-08-25 DIAGNOSIS — G4700 Insomnia, unspecified: Secondary | ICD-10-CM | POA: Diagnosis not present

## 2022-08-25 DIAGNOSIS — R0989 Other specified symptoms and signs involving the circulatory and respiratory systems: Secondary | ICD-10-CM | POA: Diagnosis not present

## 2022-08-25 DIAGNOSIS — R062 Wheezing: Secondary | ICD-10-CM | POA: Diagnosis not present

## 2022-08-26 DIAGNOSIS — K567 Ileus, unspecified: Secondary | ICD-10-CM | POA: Diagnosis not present

## 2022-08-26 DIAGNOSIS — R339 Retention of urine, unspecified: Secondary | ICD-10-CM | POA: Diagnosis not present

## 2022-08-26 DIAGNOSIS — M5137 Other intervertebral disc degeneration, lumbosacral region: Secondary | ICD-10-CM | POA: Diagnosis not present

## 2022-08-26 DIAGNOSIS — G928 Other toxic encephalopathy: Secondary | ICD-10-CM | POA: Diagnosis not present

## 2022-08-26 DIAGNOSIS — D72829 Elevated white blood cell count, unspecified: Secondary | ICD-10-CM | POA: Diagnosis not present

## 2022-08-26 DIAGNOSIS — D649 Anemia, unspecified: Secondary | ICD-10-CM | POA: Diagnosis not present

## 2022-08-27 DIAGNOSIS — M5137 Other intervertebral disc degeneration, lumbosacral region: Secondary | ICD-10-CM | POA: Diagnosis not present

## 2022-08-27 DIAGNOSIS — I10 Essential (primary) hypertension: Secondary | ICD-10-CM | POA: Diagnosis not present

## 2022-08-27 DIAGNOSIS — D649 Anemia, unspecified: Secondary | ICD-10-CM | POA: Diagnosis not present

## 2022-08-27 DIAGNOSIS — G928 Other toxic encephalopathy: Secondary | ICD-10-CM | POA: Diagnosis not present

## 2022-08-27 DIAGNOSIS — E119 Type 2 diabetes mellitus without complications: Secondary | ICD-10-CM | POA: Diagnosis not present

## 2022-08-27 DIAGNOSIS — R339 Retention of urine, unspecified: Secondary | ICD-10-CM | POA: Diagnosis not present

## 2022-08-27 DIAGNOSIS — J9602 Acute respiratory failure with hypercapnia: Secondary | ICD-10-CM | POA: Diagnosis not present

## 2022-08-27 DIAGNOSIS — K567 Ileus, unspecified: Secondary | ICD-10-CM | POA: Diagnosis not present

## 2022-08-28 DIAGNOSIS — K567 Ileus, unspecified: Secondary | ICD-10-CM | POA: Diagnosis not present

## 2022-08-28 DIAGNOSIS — J9602 Acute respiratory failure with hypercapnia: Secondary | ICD-10-CM | POA: Diagnosis not present

## 2022-08-28 DIAGNOSIS — G928 Other toxic encephalopathy: Secondary | ICD-10-CM | POA: Diagnosis not present

## 2022-08-28 DIAGNOSIS — E119 Type 2 diabetes mellitus without complications: Secondary | ICD-10-CM | POA: Diagnosis not present

## 2022-08-28 DIAGNOSIS — M5137 Other intervertebral disc degeneration, lumbosacral region: Secondary | ICD-10-CM | POA: Diagnosis not present

## 2022-08-28 DIAGNOSIS — I10 Essential (primary) hypertension: Secondary | ICD-10-CM | POA: Diagnosis not present

## 2022-08-28 DIAGNOSIS — R339 Retention of urine, unspecified: Secondary | ICD-10-CM | POA: Diagnosis not present

## 2022-08-28 DIAGNOSIS — D649 Anemia, unspecified: Secondary | ICD-10-CM | POA: Diagnosis not present

## 2022-08-31 DIAGNOSIS — R339 Retention of urine, unspecified: Secondary | ICD-10-CM | POA: Diagnosis not present

## 2022-08-31 DIAGNOSIS — I10 Essential (primary) hypertension: Secondary | ICD-10-CM | POA: Diagnosis not present

## 2022-08-31 DIAGNOSIS — M5137 Other intervertebral disc degeneration, lumbosacral region: Secondary | ICD-10-CM | POA: Diagnosis not present

## 2022-08-31 DIAGNOSIS — G928 Other toxic encephalopathy: Secondary | ICD-10-CM | POA: Diagnosis not present

## 2022-08-31 DIAGNOSIS — K567 Ileus, unspecified: Secondary | ICD-10-CM | POA: Diagnosis not present

## 2022-08-31 DIAGNOSIS — J9602 Acute respiratory failure with hypercapnia: Secondary | ICD-10-CM | POA: Diagnosis not present

## 2022-08-31 DIAGNOSIS — D649 Anemia, unspecified: Secondary | ICD-10-CM | POA: Diagnosis not present

## 2022-08-31 DIAGNOSIS — E119 Type 2 diabetes mellitus without complications: Secondary | ICD-10-CM | POA: Diagnosis not present

## 2022-09-01 DIAGNOSIS — M1990 Unspecified osteoarthritis, unspecified site: Secondary | ICD-10-CM | POA: Diagnosis not present

## 2022-09-01 DIAGNOSIS — K5900 Constipation, unspecified: Secondary | ICD-10-CM | POA: Diagnosis not present

## 2022-09-01 DIAGNOSIS — G4700 Insomnia, unspecified: Secondary | ICD-10-CM | POA: Diagnosis not present

## 2022-09-02 DIAGNOSIS — E785 Hyperlipidemia, unspecified: Secondary | ICD-10-CM | POA: Diagnosis not present

## 2022-09-02 DIAGNOSIS — R339 Retention of urine, unspecified: Secondary | ICD-10-CM | POA: Diagnosis not present

## 2022-09-02 DIAGNOSIS — D649 Anemia, unspecified: Secondary | ICD-10-CM | POA: Diagnosis not present

## 2022-09-02 DIAGNOSIS — G4700 Insomnia, unspecified: Secondary | ICD-10-CM | POA: Diagnosis not present

## 2022-09-02 DIAGNOSIS — K567 Ileus, unspecified: Secondary | ICD-10-CM | POA: Diagnosis not present

## 2022-09-02 DIAGNOSIS — M5137 Other intervertebral disc degeneration, lumbosacral region: Secondary | ICD-10-CM | POA: Diagnosis not present

## 2022-09-02 DIAGNOSIS — G928 Other toxic encephalopathy: Secondary | ICD-10-CM | POA: Diagnosis not present

## 2022-09-02 DIAGNOSIS — I10 Essential (primary) hypertension: Secondary | ICD-10-CM | POA: Diagnosis not present

## 2022-09-04 DIAGNOSIS — G928 Other toxic encephalopathy: Secondary | ICD-10-CM | POA: Diagnosis not present

## 2022-09-04 DIAGNOSIS — M5137 Other intervertebral disc degeneration, lumbosacral region: Secondary | ICD-10-CM | POA: Diagnosis not present

## 2022-09-04 DIAGNOSIS — D649 Anemia, unspecified: Secondary | ICD-10-CM | POA: Diagnosis not present

## 2022-09-04 DIAGNOSIS — K567 Ileus, unspecified: Secondary | ICD-10-CM | POA: Diagnosis not present

## 2022-09-09 DIAGNOSIS — N289 Disorder of kidney and ureter, unspecified: Secondary | ICD-10-CM | POA: Diagnosis not present

## 2022-09-09 DIAGNOSIS — K567 Ileus, unspecified: Secondary | ICD-10-CM | POA: Diagnosis not present

## 2022-09-09 DIAGNOSIS — E119 Type 2 diabetes mellitus without complications: Secondary | ICD-10-CM | POA: Diagnosis not present

## 2022-09-09 DIAGNOSIS — I10 Essential (primary) hypertension: Secondary | ICD-10-CM | POA: Diagnosis not present

## 2022-09-09 DIAGNOSIS — D649 Anemia, unspecified: Secondary | ICD-10-CM | POA: Diagnosis not present

## 2022-09-09 DIAGNOSIS — D72829 Elevated white blood cell count, unspecified: Secondary | ICD-10-CM | POA: Diagnosis not present

## 2022-09-11 DIAGNOSIS — E119 Type 2 diabetes mellitus without complications: Secondary | ICD-10-CM | POA: Diagnosis not present

## 2022-09-11 DIAGNOSIS — M5137 Other intervertebral disc degeneration, lumbosacral region: Secondary | ICD-10-CM | POA: Diagnosis not present

## 2022-09-11 DIAGNOSIS — D649 Anemia, unspecified: Secondary | ICD-10-CM | POA: Diagnosis not present

## 2022-09-11 DIAGNOSIS — K567 Ileus, unspecified: Secondary | ICD-10-CM | POA: Diagnosis not present

## 2022-09-11 DIAGNOSIS — G928 Other toxic encephalopathy: Secondary | ICD-10-CM | POA: Diagnosis not present

## 2022-09-11 DIAGNOSIS — M25562 Pain in left knee: Secondary | ICD-10-CM | POA: Diagnosis not present

## 2022-09-11 DIAGNOSIS — R339 Retention of urine, unspecified: Secondary | ICD-10-CM | POA: Diagnosis not present

## 2022-09-14 DIAGNOSIS — J9612 Chronic respiratory failure with hypercapnia: Secondary | ICD-10-CM | POA: Diagnosis not present

## 2022-09-14 DIAGNOSIS — M5137 Other intervertebral disc degeneration, lumbosacral region: Secondary | ICD-10-CM | POA: Diagnosis not present

## 2022-09-14 DIAGNOSIS — R339 Retention of urine, unspecified: Secondary | ICD-10-CM | POA: Diagnosis not present

## 2022-09-14 DIAGNOSIS — G928 Other toxic encephalopathy: Secondary | ICD-10-CM | POA: Diagnosis not present

## 2022-09-14 DIAGNOSIS — J9601 Acute respiratory failure with hypoxia: Secondary | ICD-10-CM | POA: Diagnosis not present

## 2022-09-16 DIAGNOSIS — M5137 Other intervertebral disc degeneration, lumbosacral region: Secondary | ICD-10-CM | POA: Diagnosis not present

## 2022-09-16 DIAGNOSIS — R339 Retention of urine, unspecified: Secondary | ICD-10-CM | POA: Diagnosis not present

## 2022-09-16 DIAGNOSIS — J9612 Chronic respiratory failure with hypercapnia: Secondary | ICD-10-CM | POA: Diagnosis not present

## 2022-09-16 DIAGNOSIS — M6281 Muscle weakness (generalized): Secondary | ICD-10-CM | POA: Diagnosis not present

## 2022-09-18 DIAGNOSIS — M6281 Muscle weakness (generalized): Secondary | ICD-10-CM | POA: Diagnosis not present

## 2022-09-18 DIAGNOSIS — R339 Retention of urine, unspecified: Secondary | ICD-10-CM | POA: Diagnosis not present

## 2022-09-18 DIAGNOSIS — M5137 Other intervertebral disc degeneration, lumbosacral region: Secondary | ICD-10-CM | POA: Diagnosis not present

## 2022-09-18 DIAGNOSIS — J9612 Chronic respiratory failure with hypercapnia: Secondary | ICD-10-CM | POA: Diagnosis not present

## 2022-09-21 ENCOUNTER — Encounter (HOSPITAL_COMMUNITY): Payer: Self-pay

## 2022-09-21 NOTE — Therapy (Signed)
[facility] [address] [place], [place], [address] Phone: [phone]   Fax:  [phone]  Patient Details  Name: Maria Esparza MRN: [id number] Date of Birth: Jul 16, 1945 Referring Provider:  No ref. provider found  Encounter Date: 09/21/2022  PHYSICAL THERAPY DISCHARGE SUMMARY  Visits from Start of Care: 7  Current functional level related to goals / functional outcomes: Progressing in core and proximal LE strengthening.    Remaining deficits: Continues with pain and weakness.    Education / Equipment: NA   Patient agrees to discharge. Patient goals were  not met . Patient is being discharged due to not returning since the last visit.   Nelida Meuse, PT 09/21/2022, 11:18 AM  [facility] [address] [place], [place], [address] Phone: [phone]   Fax:  [phone]

## 2022-09-25 DIAGNOSIS — R569 Unspecified convulsions: Secondary | ICD-10-CM | POA: Diagnosis not present

## 2022-09-30 DIAGNOSIS — R569 Unspecified convulsions: Secondary | ICD-10-CM | POA: Diagnosis not present

## 2022-10-21 DIAGNOSIS — M5136 Other intervertebral disc degeneration, lumbar region: Secondary | ICD-10-CM | POA: Diagnosis not present

## 2022-10-21 DIAGNOSIS — E782 Mixed hyperlipidemia: Secondary | ICD-10-CM | POA: Diagnosis not present

## 2022-10-21 DIAGNOSIS — M542 Cervicalgia: Secondary | ICD-10-CM | POA: Diagnosis not present

## 2022-10-21 DIAGNOSIS — E785 Hyperlipidemia, unspecified: Secondary | ICD-10-CM | POA: Diagnosis not present

## 2022-10-21 DIAGNOSIS — G40909 Epilepsy, unspecified, not intractable, without status epilepticus: Secondary | ICD-10-CM | POA: Diagnosis not present

## 2022-10-21 DIAGNOSIS — G4733 Obstructive sleep apnea (adult) (pediatric): Secondary | ICD-10-CM | POA: Diagnosis not present

## 2022-10-21 DIAGNOSIS — E1142 Type 2 diabetes mellitus with diabetic polyneuropathy: Secondary | ICD-10-CM | POA: Diagnosis not present

## 2022-10-21 DIAGNOSIS — D509 Iron deficiency anemia, unspecified: Secondary | ICD-10-CM | POA: Diagnosis not present

## 2022-10-21 DIAGNOSIS — I129 Hypertensive chronic kidney disease with stage 1 through stage 4 chronic kidney disease, or unspecified chronic kidney disease: Secondary | ICD-10-CM | POA: Diagnosis not present

## 2022-10-21 DIAGNOSIS — N3281 Overactive bladder: Secondary | ICD-10-CM | POA: Diagnosis not present

## 2022-10-21 DIAGNOSIS — E1169 Type 2 diabetes mellitus with other specified complication: Secondary | ICD-10-CM | POA: Diagnosis not present

## 2022-10-23 DIAGNOSIS — E1169 Type 2 diabetes mellitus with other specified complication: Secondary | ICD-10-CM | POA: Diagnosis not present

## 2022-10-23 DIAGNOSIS — E782 Mixed hyperlipidemia: Secondary | ICD-10-CM | POA: Diagnosis not present

## 2022-10-27 DIAGNOSIS — M47817 Spondylosis without myelopathy or radiculopathy, lumbosacral region: Secondary | ICD-10-CM | POA: Diagnosis not present

## 2022-10-27 DIAGNOSIS — M4316 Spondylolisthesis, lumbar region: Secondary | ICD-10-CM | POA: Diagnosis not present

## 2022-10-27 DIAGNOSIS — M5137 Other intervertebral disc degeneration, lumbosacral region: Secondary | ICD-10-CM | POA: Diagnosis not present

## 2022-10-27 DIAGNOSIS — Z981 Arthrodesis status: Secondary | ICD-10-CM | POA: Diagnosis not present

## 2022-10-27 DIAGNOSIS — M47816 Spondylosis without myelopathy or radiculopathy, lumbar region: Secondary | ICD-10-CM | POA: Diagnosis not present

## 2022-10-27 DIAGNOSIS — I7090 Unspecified atherosclerosis: Secondary | ICD-10-CM | POA: Diagnosis not present

## 2022-10-27 DIAGNOSIS — M5136 Other intervertebral disc degeneration, lumbar region: Secondary | ICD-10-CM | POA: Diagnosis not present

## 2022-11-04 DIAGNOSIS — M5136 Other intervertebral disc degeneration, lumbar region: Secondary | ICD-10-CM | POA: Diagnosis not present

## 2022-11-04 DIAGNOSIS — D509 Iron deficiency anemia, unspecified: Secondary | ICD-10-CM | POA: Diagnosis not present

## 2022-11-04 DIAGNOSIS — N182 Chronic kidney disease, stage 2 (mild): Secondary | ICD-10-CM | POA: Diagnosis not present

## 2022-11-04 DIAGNOSIS — G40909 Epilepsy, unspecified, not intractable, without status epilepticus: Secondary | ICD-10-CM | POA: Diagnosis not present

## 2022-11-04 DIAGNOSIS — E1159 Type 2 diabetes mellitus with other circulatory complications: Secondary | ICD-10-CM | POA: Diagnosis not present

## 2022-11-04 DIAGNOSIS — E1142 Type 2 diabetes mellitus with diabetic polyneuropathy: Secondary | ICD-10-CM | POA: Diagnosis not present

## 2022-11-04 DIAGNOSIS — I129 Hypertensive chronic kidney disease with stage 1 through stage 4 chronic kidney disease, or unspecified chronic kidney disease: Secondary | ICD-10-CM | POA: Diagnosis not present

## 2022-11-04 DIAGNOSIS — E1169 Type 2 diabetes mellitus with other specified complication: Secondary | ICD-10-CM | POA: Diagnosis not present

## 2022-11-04 DIAGNOSIS — G4733 Obstructive sleep apnea (adult) (pediatric): Secondary | ICD-10-CM | POA: Diagnosis not present

## 2022-11-04 DIAGNOSIS — E1165 Type 2 diabetes mellitus with hyperglycemia: Secondary | ICD-10-CM | POA: Diagnosis not present

## 2022-11-04 DIAGNOSIS — Z23 Encounter for immunization: Secondary | ICD-10-CM | POA: Diagnosis not present

## 2022-11-05 ENCOUNTER — Other Ambulatory Visit (HOSPITAL_COMMUNITY): Payer: Self-pay | Admitting: Radiology

## 2022-11-05 DIAGNOSIS — Z87891 Personal history of nicotine dependence: Secondary | ICD-10-CM

## 2022-11-09 DIAGNOSIS — L110 Acquired keratosis follicularis: Secondary | ICD-10-CM | POA: Diagnosis not present

## 2022-11-09 DIAGNOSIS — M79672 Pain in left foot: Secondary | ICD-10-CM | POA: Diagnosis not present

## 2022-11-09 DIAGNOSIS — M79671 Pain in right foot: Secondary | ICD-10-CM | POA: Diagnosis not present

## 2022-11-09 DIAGNOSIS — E1140 Type 2 diabetes mellitus with diabetic neuropathy, unspecified: Secondary | ICD-10-CM | POA: Diagnosis not present

## 2022-11-10 DIAGNOSIS — M79672 Pain in left foot: Secondary | ICD-10-CM | POA: Diagnosis not present

## 2022-11-10 DIAGNOSIS — M79671 Pain in right foot: Secondary | ICD-10-CM | POA: Diagnosis not present

## 2022-11-10 DIAGNOSIS — H35722 Serous detachment of retinal pigment epithelium, left eye: Secondary | ICD-10-CM | POA: Diagnosis not present

## 2022-11-10 DIAGNOSIS — H353221 Exudative age-related macular degeneration, left eye, with active choroidal neovascularization: Secondary | ICD-10-CM | POA: Diagnosis not present

## 2022-11-10 DIAGNOSIS — H353122 Nonexudative age-related macular degeneration, left eye, intermediate dry stage: Secondary | ICD-10-CM | POA: Diagnosis not present

## 2022-11-10 DIAGNOSIS — E1140 Type 2 diabetes mellitus with diabetic neuropathy, unspecified: Secondary | ICD-10-CM | POA: Diagnosis not present

## 2022-11-10 DIAGNOSIS — H353112 Nonexudative age-related macular degeneration, right eye, intermediate dry stage: Secondary | ICD-10-CM | POA: Diagnosis not present

## 2022-11-10 DIAGNOSIS — L110 Acquired keratosis follicularis: Secondary | ICD-10-CM | POA: Diagnosis not present

## 2022-12-02 DIAGNOSIS — L209 Atopic dermatitis, unspecified: Secondary | ICD-10-CM | POA: Diagnosis not present

## 2022-12-02 DIAGNOSIS — D509 Iron deficiency anemia, unspecified: Secondary | ICD-10-CM | POA: Diagnosis not present

## 2022-12-02 DIAGNOSIS — R21 Rash and other nonspecific skin eruption: Secondary | ICD-10-CM | POA: Diagnosis not present

## 2022-12-02 DIAGNOSIS — Z713 Dietary counseling and surveillance: Secondary | ICD-10-CM | POA: Diagnosis not present

## 2022-12-02 DIAGNOSIS — M542 Cervicalgia: Secondary | ICD-10-CM | POA: Diagnosis not present

## 2022-12-02 DIAGNOSIS — E875 Hyperkalemia: Secondary | ICD-10-CM | POA: Diagnosis not present

## 2022-12-02 DIAGNOSIS — N3281 Overactive bladder: Secondary | ICD-10-CM | POA: Diagnosis not present

## 2022-12-02 DIAGNOSIS — K5900 Constipation, unspecified: Secondary | ICD-10-CM | POA: Diagnosis not present

## 2022-12-02 DIAGNOSIS — L2989 Other pruritus: Secondary | ICD-10-CM | POA: Diagnosis not present

## 2022-12-02 DIAGNOSIS — Z6838 Body mass index (BMI) 38.0-38.9, adult: Secondary | ICD-10-CM | POA: Diagnosis not present

## 2022-12-02 DIAGNOSIS — Z7182 Exercise counseling: Secondary | ICD-10-CM | POA: Diagnosis not present

## 2022-12-08 DIAGNOSIS — R21 Rash and other nonspecific skin eruption: Secondary | ICD-10-CM | POA: Diagnosis not present

## 2022-12-08 DIAGNOSIS — E1140 Type 2 diabetes mellitus with diabetic neuropathy, unspecified: Secondary | ICD-10-CM | POA: Diagnosis not present

## 2022-12-08 DIAGNOSIS — M79671 Pain in right foot: Secondary | ICD-10-CM | POA: Diagnosis not present

## 2022-12-08 DIAGNOSIS — L309 Dermatitis, unspecified: Secondary | ICD-10-CM | POA: Diagnosis not present

## 2022-12-08 DIAGNOSIS — L568 Other specified acute skin changes due to ultraviolet radiation: Secondary | ICD-10-CM | POA: Diagnosis not present

## 2022-12-08 DIAGNOSIS — L110 Acquired keratosis follicularis: Secondary | ICD-10-CM | POA: Diagnosis not present

## 2022-12-08 DIAGNOSIS — M79672 Pain in left foot: Secondary | ICD-10-CM | POA: Diagnosis not present

## 2022-12-15 DIAGNOSIS — H353221 Exudative age-related macular degeneration, left eye, with active choroidal neovascularization: Secondary | ICD-10-CM | POA: Diagnosis not present

## 2022-12-15 DIAGNOSIS — G4733 Obstructive sleep apnea (adult) (pediatric): Secondary | ICD-10-CM | POA: Diagnosis not present

## 2022-12-15 DIAGNOSIS — H35722 Serous detachment of retinal pigment epithelium, left eye: Secondary | ICD-10-CM | POA: Diagnosis not present

## 2022-12-15 DIAGNOSIS — H353112 Nonexudative age-related macular degeneration, right eye, intermediate dry stage: Secondary | ICD-10-CM | POA: Diagnosis not present

## 2022-12-15 DIAGNOSIS — H353122 Nonexudative age-related macular degeneration, left eye, intermediate dry stage: Secondary | ICD-10-CM | POA: Diagnosis not present

## 2022-12-22 DIAGNOSIS — L249 Irritant contact dermatitis, unspecified cause: Secondary | ICD-10-CM | POA: Diagnosis not present

## 2022-12-23 DIAGNOSIS — Z0100 Encounter for examination of eyes and vision without abnormal findings: Secondary | ICD-10-CM | POA: Diagnosis not present

## 2022-12-23 DIAGNOSIS — H353131 Nonexudative age-related macular degeneration, bilateral, early dry stage: Secondary | ICD-10-CM | POA: Diagnosis not present

## 2022-12-23 DIAGNOSIS — E119 Type 2 diabetes mellitus without complications: Secondary | ICD-10-CM | POA: Diagnosis not present

## 2022-12-23 DIAGNOSIS — H35722 Serous detachment of retinal pigment epithelium, left eye: Secondary | ICD-10-CM | POA: Diagnosis not present

## 2022-12-23 DIAGNOSIS — H5213 Myopia, bilateral: Secondary | ICD-10-CM | POA: Diagnosis not present

## 2022-12-24 ENCOUNTER — Other Ambulatory Visit (HOSPITAL_COMMUNITY): Payer: Self-pay | Admitting: Internal Medicine

## 2022-12-24 DIAGNOSIS — Z1231 Encounter for screening mammogram for malignant neoplasm of breast: Secondary | ICD-10-CM

## 2022-12-30 DIAGNOSIS — Z981 Arthrodesis status: Secondary | ICD-10-CM | POA: Diagnosis not present

## 2022-12-30 DIAGNOSIS — M4316 Spondylolisthesis, lumbar region: Secondary | ICD-10-CM | POA: Diagnosis not present

## 2022-12-30 DIAGNOSIS — M4326 Fusion of spine, lumbar region: Secondary | ICD-10-CM | POA: Diagnosis not present

## 2023-01-05 ENCOUNTER — Ambulatory Visit (HOSPITAL_COMMUNITY): Payer: Medicare HMO

## 2023-01-07 DIAGNOSIS — L110 Acquired keratosis follicularis: Secondary | ICD-10-CM | POA: Diagnosis not present

## 2023-01-07 DIAGNOSIS — M79671 Pain in right foot: Secondary | ICD-10-CM | POA: Diagnosis not present

## 2023-01-07 DIAGNOSIS — E1140 Type 2 diabetes mellitus with diabetic neuropathy, unspecified: Secondary | ICD-10-CM | POA: Diagnosis not present

## 2023-01-07 DIAGNOSIS — M79672 Pain in left foot: Secondary | ICD-10-CM | POA: Diagnosis not present

## 2023-01-18 DIAGNOSIS — L110 Acquired keratosis follicularis: Secondary | ICD-10-CM | POA: Diagnosis not present

## 2023-01-18 DIAGNOSIS — M79672 Pain in left foot: Secondary | ICD-10-CM | POA: Diagnosis not present

## 2023-01-18 DIAGNOSIS — M79671 Pain in right foot: Secondary | ICD-10-CM | POA: Diagnosis not present

## 2023-01-18 DIAGNOSIS — E1140 Type 2 diabetes mellitus with diabetic neuropathy, unspecified: Secondary | ICD-10-CM | POA: Diagnosis not present

## 2023-01-20 DIAGNOSIS — H35722 Serous detachment of retinal pigment epithelium, left eye: Secondary | ICD-10-CM | POA: Diagnosis not present

## 2023-01-20 DIAGNOSIS — H353112 Nonexudative age-related macular degeneration, right eye, intermediate dry stage: Secondary | ICD-10-CM | POA: Diagnosis not present

## 2023-01-20 DIAGNOSIS — H353122 Nonexudative age-related macular degeneration, left eye, intermediate dry stage: Secondary | ICD-10-CM | POA: Diagnosis not present

## 2023-01-20 DIAGNOSIS — H353221 Exudative age-related macular degeneration, left eye, with active choroidal neovascularization: Secondary | ICD-10-CM | POA: Diagnosis not present

## 2023-02-04 DIAGNOSIS — L110 Acquired keratosis follicularis: Secondary | ICD-10-CM | POA: Diagnosis not present

## 2023-02-04 DIAGNOSIS — M79672 Pain in left foot: Secondary | ICD-10-CM | POA: Diagnosis not present

## 2023-02-04 DIAGNOSIS — M79671 Pain in right foot: Secondary | ICD-10-CM | POA: Diagnosis not present

## 2023-02-04 DIAGNOSIS — E1140 Type 2 diabetes mellitus with diabetic neuropathy, unspecified: Secondary | ICD-10-CM | POA: Diagnosis not present

## 2023-02-05 ENCOUNTER — Ambulatory Visit (HOSPITAL_COMMUNITY): Payer: Medicare HMO

## 2023-02-08 DIAGNOSIS — M545 Low back pain, unspecified: Secondary | ICD-10-CM | POA: Diagnosis not present

## 2023-02-08 DIAGNOSIS — G40909 Epilepsy, unspecified, not intractable, without status epilepticus: Secondary | ICD-10-CM | POA: Diagnosis not present

## 2023-02-11 DIAGNOSIS — R569 Unspecified convulsions: Secondary | ICD-10-CM | POA: Diagnosis not present

## 2023-02-17 ENCOUNTER — Ambulatory Visit (HOSPITAL_COMMUNITY)
Admission: RE | Admit: 2023-02-17 | Discharge: 2023-02-17 | Disposition: A | Discharge status: Home / Self Care | Payer: Medicare HMO | Source: Ambulatory Visit | Attending: Internal Medicine | Admitting: Internal Medicine

## 2023-02-17 ENCOUNTER — Encounter (HOSPITAL_COMMUNITY): Payer: Self-pay

## 2023-02-17 DIAGNOSIS — Z1231 Encounter for screening mammogram for malignant neoplasm of breast: Secondary | ICD-10-CM | POA: Insufficient documentation

## 2023-03-02 DIAGNOSIS — E1169 Type 2 diabetes mellitus with other specified complication: Secondary | ICD-10-CM | POA: Diagnosis not present

## 2023-03-02 DIAGNOSIS — E782 Mixed hyperlipidemia: Secondary | ICD-10-CM | POA: Diagnosis not present

## 2023-03-08 DIAGNOSIS — M542 Cervicalgia: Secondary | ICD-10-CM | POA: Diagnosis not present

## 2023-03-08 DIAGNOSIS — I129 Hypertensive chronic kidney disease with stage 1 through stage 4 chronic kidney disease, or unspecified chronic kidney disease: Secondary | ICD-10-CM | POA: Diagnosis not present

## 2023-03-08 DIAGNOSIS — E875 Hyperkalemia: Secondary | ICD-10-CM | POA: Diagnosis not present

## 2023-03-08 DIAGNOSIS — N3281 Overactive bladder: Secondary | ICD-10-CM | POA: Diagnosis not present

## 2023-03-08 DIAGNOSIS — D509 Iron deficiency anemia, unspecified: Secondary | ICD-10-CM | POA: Diagnosis not present

## 2023-03-08 DIAGNOSIS — G40909 Epilepsy, unspecified, not intractable, without status epilepticus: Secondary | ICD-10-CM | POA: Diagnosis not present

## 2023-03-08 DIAGNOSIS — G4733 Obstructive sleep apnea (adult) (pediatric): Secondary | ICD-10-CM | POA: Diagnosis not present

## 2023-03-08 DIAGNOSIS — N182 Chronic kidney disease, stage 2 (mild): Secondary | ICD-10-CM | POA: Diagnosis not present

## 2023-03-08 DIAGNOSIS — E1159 Type 2 diabetes mellitus with other circulatory complications: Secondary | ICD-10-CM | POA: Diagnosis not present

## 2023-03-08 DIAGNOSIS — M51369 Other intervertebral disc degeneration, lumbar region without mention of lumbar back pain or lower extremity pain: Secondary | ICD-10-CM | POA: Diagnosis not present

## 2023-03-08 DIAGNOSIS — E1169 Type 2 diabetes mellitus with other specified complication: Secondary | ICD-10-CM | POA: Diagnosis not present

## 2023-03-08 DIAGNOSIS — E782 Mixed hyperlipidemia: Secondary | ICD-10-CM | POA: Diagnosis not present

## 2023-03-11 DIAGNOSIS — M79672 Pain in left foot: Secondary | ICD-10-CM | POA: Diagnosis not present

## 2023-03-11 DIAGNOSIS — M79671 Pain in right foot: Secondary | ICD-10-CM | POA: Diagnosis not present

## 2023-03-11 DIAGNOSIS — E1140 Type 2 diabetes mellitus with diabetic neuropathy, unspecified: Secondary | ICD-10-CM | POA: Diagnosis not present

## 2023-03-11 DIAGNOSIS — L110 Acquired keratosis follicularis: Secondary | ICD-10-CM | POA: Diagnosis not present

## 2023-03-22 DIAGNOSIS — H353221 Exudative age-related macular degeneration, left eye, with active choroidal neovascularization: Secondary | ICD-10-CM | POA: Diagnosis not present

## 2023-03-22 DIAGNOSIS — H35722 Serous detachment of retinal pigment epithelium, left eye: Secondary | ICD-10-CM | POA: Diagnosis not present

## 2023-03-22 DIAGNOSIS — H353112 Nonexudative age-related macular degeneration, right eye, intermediate dry stage: Secondary | ICD-10-CM | POA: Diagnosis not present

## 2023-03-22 DIAGNOSIS — H353122 Nonexudative age-related macular degeneration, left eye, intermediate dry stage: Secondary | ICD-10-CM | POA: Diagnosis not present

## 2023-03-23 ENCOUNTER — Ambulatory Visit (HOSPITAL_COMMUNITY)
Admission: RE | Admit: 2023-03-23 | Discharge: 2023-03-23 | Disposition: A | Discharge status: Home / Self Care | Payer: Medicare HMO | Source: Ambulatory Visit | Attending: Family Medicine | Admitting: Family Medicine

## 2023-03-23 ENCOUNTER — Other Ambulatory Visit (HOSPITAL_COMMUNITY): Payer: Self-pay | Admitting: Family Medicine

## 2023-03-23 DIAGNOSIS — R519 Headache, unspecified: Secondary | ICD-10-CM | POA: Diagnosis not present

## 2023-03-23 DIAGNOSIS — S0990XA Unspecified injury of head, initial encounter: Secondary | ICD-10-CM

## 2023-03-23 DIAGNOSIS — I6782 Cerebral ischemia: Secondary | ICD-10-CM | POA: Diagnosis not present

## 2023-03-23 DIAGNOSIS — H538 Other visual disturbances: Secondary | ICD-10-CM | POA: Diagnosis not present

## 2023-03-29 DIAGNOSIS — L110 Acquired keratosis follicularis: Secondary | ICD-10-CM | POA: Diagnosis not present

## 2023-03-29 DIAGNOSIS — E1140 Type 2 diabetes mellitus with diabetic neuropathy, unspecified: Secondary | ICD-10-CM | POA: Diagnosis not present

## 2023-03-29 DIAGNOSIS — M79672 Pain in left foot: Secondary | ICD-10-CM | POA: Diagnosis not present

## 2023-03-29 DIAGNOSIS — M79671 Pain in right foot: Secondary | ICD-10-CM | POA: Diagnosis not present

## 2023-03-30 DIAGNOSIS — N182 Chronic kidney disease, stage 2 (mild): Secondary | ICD-10-CM | POA: Diagnosis not present

## 2023-03-30 DIAGNOSIS — D509 Iron deficiency anemia, unspecified: Secondary | ICD-10-CM | POA: Diagnosis not present

## 2023-04-05 DIAGNOSIS — N182 Chronic kidney disease, stage 2 (mild): Secondary | ICD-10-CM | POA: Diagnosis not present

## 2023-04-05 DIAGNOSIS — G4733 Obstructive sleep apnea (adult) (pediatric): Secondary | ICD-10-CM | POA: Diagnosis not present

## 2023-04-05 DIAGNOSIS — H538 Other visual disturbances: Secondary | ICD-10-CM | POA: Diagnosis not present

## 2023-04-05 DIAGNOSIS — S0990XA Unspecified injury of head, initial encounter: Secondary | ICD-10-CM | POA: Diagnosis not present

## 2023-04-05 DIAGNOSIS — I10 Essential (primary) hypertension: Secondary | ICD-10-CM | POA: Diagnosis not present

## 2023-04-05 DIAGNOSIS — D509 Iron deficiency anemia, unspecified: Secondary | ICD-10-CM | POA: Diagnosis not present

## 2023-04-05 DIAGNOSIS — E1122 Type 2 diabetes mellitus with diabetic chronic kidney disease: Secondary | ICD-10-CM | POA: Diagnosis not present

## 2023-04-05 DIAGNOSIS — E1169 Type 2 diabetes mellitus with other specified complication: Secondary | ICD-10-CM | POA: Diagnosis not present

## 2023-04-05 DIAGNOSIS — I129 Hypertensive chronic kidney disease with stage 1 through stage 4 chronic kidney disease, or unspecified chronic kidney disease: Secondary | ICD-10-CM | POA: Diagnosis not present

## 2023-04-05 DIAGNOSIS — R519 Headache, unspecified: Secondary | ICD-10-CM | POA: Diagnosis not present

## 2023-04-08 DIAGNOSIS — M792 Neuralgia and neuritis, unspecified: Secondary | ICD-10-CM | POA: Diagnosis not present

## 2023-04-08 DIAGNOSIS — M79672 Pain in left foot: Secondary | ICD-10-CM | POA: Diagnosis not present

## 2023-04-08 DIAGNOSIS — E1140 Type 2 diabetes mellitus with diabetic neuropathy, unspecified: Secondary | ICD-10-CM | POA: Diagnosis not present

## 2023-04-08 DIAGNOSIS — M79671 Pain in right foot: Secondary | ICD-10-CM | POA: Diagnosis not present

## 2023-05-03 DIAGNOSIS — H353221 Exudative age-related macular degeneration, left eye, with active choroidal neovascularization: Secondary | ICD-10-CM | POA: Diagnosis not present

## 2023-05-03 DIAGNOSIS — H353122 Nonexudative age-related macular degeneration, left eye, intermediate dry stage: Secondary | ICD-10-CM | POA: Diagnosis not present

## 2023-05-03 DIAGNOSIS — H35722 Serous detachment of retinal pigment epithelium, left eye: Secondary | ICD-10-CM | POA: Diagnosis not present

## 2023-05-03 DIAGNOSIS — H353112 Nonexudative age-related macular degeneration, right eye, intermediate dry stage: Secondary | ICD-10-CM | POA: Diagnosis not present

## 2023-05-06 DIAGNOSIS — M79671 Pain in right foot: Secondary | ICD-10-CM | POA: Diagnosis not present

## 2023-05-06 DIAGNOSIS — E1140 Type 2 diabetes mellitus with diabetic neuropathy, unspecified: Secondary | ICD-10-CM | POA: Diagnosis not present

## 2023-05-06 DIAGNOSIS — L110 Acquired keratosis follicularis: Secondary | ICD-10-CM | POA: Diagnosis not present

## 2023-05-06 DIAGNOSIS — M79672 Pain in left foot: Secondary | ICD-10-CM | POA: Diagnosis not present

## 2023-05-11 DIAGNOSIS — H26491 Other secondary cataract, right eye: Secondary | ICD-10-CM | POA: Diagnosis not present

## 2023-05-11 DIAGNOSIS — H35722 Serous detachment of retinal pigment epithelium, left eye: Secondary | ICD-10-CM | POA: Diagnosis not present

## 2023-05-11 DIAGNOSIS — H353131 Nonexudative age-related macular degeneration, bilateral, early dry stage: Secondary | ICD-10-CM | POA: Diagnosis not present

## 2023-05-11 DIAGNOSIS — E119 Type 2 diabetes mellitus without complications: Secondary | ICD-10-CM | POA: Diagnosis not present

## 2023-05-27 DIAGNOSIS — T148XXA Other injury of unspecified body region, initial encounter: Secondary | ICD-10-CM | POA: Diagnosis not present

## 2023-05-27 DIAGNOSIS — R519 Headache, unspecified: Secondary | ICD-10-CM | POA: Diagnosis not present

## 2023-05-27 DIAGNOSIS — G40909 Epilepsy, unspecified, not intractable, without status epilepticus: Secondary | ICD-10-CM | POA: Diagnosis not present

## 2023-05-27 DIAGNOSIS — I10 Essential (primary) hypertension: Secondary | ICD-10-CM | POA: Diagnosis not present

## 2023-05-27 DIAGNOSIS — S0990XD Unspecified injury of head, subsequent encounter: Secondary | ICD-10-CM | POA: Diagnosis not present

## 2023-05-27 DIAGNOSIS — S0990XA Unspecified injury of head, initial encounter: Secondary | ICD-10-CM | POA: Diagnosis not present

## 2023-05-27 DIAGNOSIS — D509 Iron deficiency anemia, unspecified: Secondary | ICD-10-CM | POA: Diagnosis not present

## 2023-05-27 DIAGNOSIS — W19XXXD Unspecified fall, subsequent encounter: Secondary | ICD-10-CM | POA: Diagnosis not present

## 2023-06-03 DIAGNOSIS — L110 Acquired keratosis follicularis: Secondary | ICD-10-CM | POA: Diagnosis not present

## 2023-06-03 DIAGNOSIS — M79671 Pain in right foot: Secondary | ICD-10-CM | POA: Diagnosis not present

## 2023-06-03 DIAGNOSIS — E1140 Type 2 diabetes mellitus with diabetic neuropathy, unspecified: Secondary | ICD-10-CM | POA: Diagnosis not present

## 2023-06-03 DIAGNOSIS — M79672 Pain in left foot: Secondary | ICD-10-CM | POA: Diagnosis not present

## 2023-06-03 DIAGNOSIS — H26492 Other secondary cataract, left eye: Secondary | ICD-10-CM | POA: Diagnosis not present

## 2023-06-14 DIAGNOSIS — M79672 Pain in left foot: Secondary | ICD-10-CM | POA: Diagnosis not present

## 2023-06-14 DIAGNOSIS — E1140 Type 2 diabetes mellitus with diabetic neuropathy, unspecified: Secondary | ICD-10-CM | POA: Diagnosis not present

## 2023-06-14 DIAGNOSIS — H353221 Exudative age-related macular degeneration, left eye, with active choroidal neovascularization: Secondary | ICD-10-CM | POA: Diagnosis not present

## 2023-06-14 DIAGNOSIS — M79671 Pain in right foot: Secondary | ICD-10-CM | POA: Diagnosis not present

## 2023-06-14 DIAGNOSIS — L110 Acquired keratosis follicularis: Secondary | ICD-10-CM | POA: Diagnosis not present

## 2023-06-14 DIAGNOSIS — H353132 Nonexudative age-related macular degeneration, bilateral, intermediate dry stage: Secondary | ICD-10-CM | POA: Diagnosis not present

## 2023-06-14 DIAGNOSIS — H35722 Serous detachment of retinal pigment epithelium, left eye: Secondary | ICD-10-CM | POA: Diagnosis not present

## 2023-06-23 ENCOUNTER — Other Ambulatory Visit (HOSPITAL_COMMUNITY): Payer: Self-pay | Admitting: Family Medicine

## 2023-06-23 DIAGNOSIS — M25551 Pain in right hip: Secondary | ICD-10-CM

## 2023-06-24 ENCOUNTER — Ambulatory Visit (HOSPITAL_COMMUNITY)
Admission: RE | Admit: 2023-06-24 | Discharge: 2023-06-24 | Disposition: A | Discharge status: Home / Self Care | Source: Ambulatory Visit | Attending: Family Medicine | Admitting: Family Medicine

## 2023-06-24 DIAGNOSIS — M25551 Pain in right hip: Secondary | ICD-10-CM | POA: Diagnosis not present

## 2023-06-24 DIAGNOSIS — M1611 Unilateral primary osteoarthritis, right hip: Secondary | ICD-10-CM | POA: Diagnosis not present

## 2023-06-24 DIAGNOSIS — Z981 Arthrodesis status: Secondary | ICD-10-CM | POA: Diagnosis not present

## 2023-06-30 DIAGNOSIS — D509 Iron deficiency anemia, unspecified: Secondary | ICD-10-CM | POA: Diagnosis not present

## 2023-06-30 DIAGNOSIS — E1169 Type 2 diabetes mellitus with other specified complication: Secondary | ICD-10-CM | POA: Diagnosis not present

## 2023-06-30 DIAGNOSIS — E782 Mixed hyperlipidemia: Secondary | ICD-10-CM | POA: Diagnosis not present

## 2023-07-01 DIAGNOSIS — M79672 Pain in left foot: Secondary | ICD-10-CM | POA: Diagnosis not present

## 2023-07-01 DIAGNOSIS — M79671 Pain in right foot: Secondary | ICD-10-CM | POA: Diagnosis not present

## 2023-07-01 DIAGNOSIS — E1140 Type 2 diabetes mellitus with diabetic neuropathy, unspecified: Secondary | ICD-10-CM | POA: Diagnosis not present

## 2023-07-01 DIAGNOSIS — L110 Acquired keratosis follicularis: Secondary | ICD-10-CM | POA: Diagnosis not present

## 2023-07-02 DIAGNOSIS — I129 Hypertensive chronic kidney disease with stage 1 through stage 4 chronic kidney disease, or unspecified chronic kidney disease: Secondary | ICD-10-CM | POA: Diagnosis not present

## 2023-07-02 DIAGNOSIS — D509 Iron deficiency anemia, unspecified: Secondary | ICD-10-CM | POA: Diagnosis not present

## 2023-07-02 DIAGNOSIS — S0990XA Unspecified injury of head, initial encounter: Secondary | ICD-10-CM | POA: Diagnosis not present

## 2023-07-02 DIAGNOSIS — G40909 Epilepsy, unspecified, not intractable, without status epilepticus: Secondary | ICD-10-CM | POA: Diagnosis not present

## 2023-07-02 DIAGNOSIS — R519 Headache, unspecified: Secondary | ICD-10-CM | POA: Diagnosis not present

## 2023-07-02 DIAGNOSIS — M25552 Pain in left hip: Secondary | ICD-10-CM | POA: Diagnosis not present

## 2023-07-02 DIAGNOSIS — E1169 Type 2 diabetes mellitus with other specified complication: Secondary | ICD-10-CM | POA: Diagnosis not present

## 2023-07-02 DIAGNOSIS — N182 Chronic kidney disease, stage 2 (mild): Secondary | ICD-10-CM | POA: Diagnosis not present

## 2023-07-02 DIAGNOSIS — T148XXA Other injury of unspecified body region, initial encounter: Secondary | ICD-10-CM | POA: Diagnosis not present

## 2023-07-02 DIAGNOSIS — G4733 Obstructive sleep apnea (adult) (pediatric): Secondary | ICD-10-CM | POA: Diagnosis not present

## 2023-07-02 DIAGNOSIS — I10 Essential (primary) hypertension: Secondary | ICD-10-CM | POA: Diagnosis not present

## 2023-07-02 DIAGNOSIS — N3281 Overactive bladder: Secondary | ICD-10-CM | POA: Diagnosis not present

## 2023-07-19 ENCOUNTER — Other Ambulatory Visit: Payer: Self-pay

## 2023-07-20 ENCOUNTER — Other Ambulatory Visit: Payer: Self-pay

## 2023-07-21 ENCOUNTER — Other Ambulatory Visit (HOSPITAL_COMMUNITY): Payer: Self-pay

## 2023-07-28 ENCOUNTER — Other Ambulatory Visit: Payer: Self-pay

## 2023-07-28 ENCOUNTER — Other Ambulatory Visit (HOSPITAL_COMMUNITY): Payer: Self-pay

## 2023-07-28 MED ORDER — LAMOTRIGINE ER 200 MG PO TB24
200.0000 mg | ORAL_TABLET | Freq: Two times a day (BID) | ORAL | 2 refills | Status: AC
  Filled 2023-07-28 – 2023-08-09 (×2): qty 60, 30d supply, fill #0
  Filled ????-??-??: fill #0

## 2023-07-29 DIAGNOSIS — E1140 Type 2 diabetes mellitus with diabetic neuropathy, unspecified: Secondary | ICD-10-CM | POA: Diagnosis not present

## 2023-07-29 DIAGNOSIS — L110 Acquired keratosis follicularis: Secondary | ICD-10-CM | POA: Diagnosis not present

## 2023-07-29 DIAGNOSIS — M79672 Pain in left foot: Secondary | ICD-10-CM | POA: Diagnosis not present

## 2023-07-29 DIAGNOSIS — M79671 Pain in right foot: Secondary | ICD-10-CM | POA: Diagnosis not present

## 2023-08-02 ENCOUNTER — Other Ambulatory Visit: Payer: Self-pay

## 2023-08-02 DIAGNOSIS — H353221 Exudative age-related macular degeneration, left eye, with active choroidal neovascularization: Secondary | ICD-10-CM | POA: Diagnosis not present

## 2023-08-02 DIAGNOSIS — H35722 Serous detachment of retinal pigment epithelium, left eye: Secondary | ICD-10-CM | POA: Diagnosis not present

## 2023-08-02 DIAGNOSIS — H353122 Nonexudative age-related macular degeneration, left eye, intermediate dry stage: Secondary | ICD-10-CM | POA: Diagnosis not present

## 2023-08-02 DIAGNOSIS — H353112 Nonexudative age-related macular degeneration, right eye, intermediate dry stage: Secondary | ICD-10-CM | POA: Diagnosis not present

## 2023-08-03 ENCOUNTER — Other Ambulatory Visit: Payer: Self-pay

## 2023-08-06 ENCOUNTER — Other Ambulatory Visit: Payer: Self-pay

## 2023-08-06 ENCOUNTER — Other Ambulatory Visit (HOSPITAL_BASED_OUTPATIENT_CLINIC_OR_DEPARTMENT_OTHER): Payer: Self-pay

## 2023-08-06 ENCOUNTER — Other Ambulatory Visit (HOSPITAL_COMMUNITY): Payer: Self-pay

## 2023-08-06 MED ORDER — MIRABEGRON ER 25 MG PO TB24
25.0000 mg | ORAL_TABLET | Freq: Every day | ORAL | 2 refills | Status: AC
  Filled 2023-08-06 – 2023-08-09 (×2): qty 30, 30d supply, fill #0
  Filled ????-??-??: fill #0

## 2023-08-09 ENCOUNTER — Other Ambulatory Visit (HOSPITAL_COMMUNITY): Payer: Self-pay

## 2023-08-09 ENCOUNTER — Other Ambulatory Visit: Payer: Self-pay

## 2023-08-09 ENCOUNTER — Other Ambulatory Visit: Payer: Self-pay | Admitting: Family Medicine

## 2023-08-09 MED ORDER — OLMESARTAN MEDOXOMIL 20 MG PO TABS
20.0000 mg | ORAL_TABLET | Freq: Every day | ORAL | 2 refills | Status: AC
  Filled 2023-08-09 – 2024-01-10 (×2): qty 30, 30d supply, fill #0
  Filled 2024-02-03: qty 30, 30d supply, fill #1

## 2023-08-09 MED ORDER — BETAMETHASONE DIPROPIONATE 0.05 % EX OINT: TOPICAL_OINTMENT | CUTANEOUS | 3 refills | Status: AC

## 2023-08-09 MED ORDER — MIRABEGRON ER 25 MG PO TB24
25.0000 mg | ORAL_TABLET | Freq: Every day | ORAL | 2 refills | Status: AC
  Filled 2023-08-09: qty 30, 30d supply, fill #0

## 2023-08-09 MED ORDER — LAMOTRIGINE ER 200 MG PO TB24
200.0000 mg | ORAL_TABLET | Freq: Two times a day (BID) | ORAL | 11 refills | Status: AC
  Filled 2023-08-09 – 2023-08-12 (×3): qty 60, 30d supply, fill #0
  Filled 2023-09-13: qty 60, 30d supply, fill #1

## 2023-08-09 MED ORDER — METFORMIN HCL 500 MG PO TABS
500.0000 mg | ORAL_TABLET | Freq: Every day | ORAL | 5 refills | Status: DC
  Filled 2023-08-09 – 2023-08-12 (×3): qty 30, 30d supply, fill #0
  Filled 2023-09-13: qty 30, 30d supply, fill #1
  Filled 2023-10-08 – 2023-10-14 (×2): qty 30, 30d supply, fill #2
  Filled 2023-11-03 – 2023-11-10 (×3): qty 30, 30d supply, fill #3
  Filled 2023-12-07: qty 30, 30d supply, fill #4
  Filled 2024-01-10: qty 30, 30d supply, fill #5

## 2023-08-09 MED ORDER — PIOGLITAZONE HCL 30 MG PO TABS
30.0000 mg | ORAL_TABLET | Freq: Every evening | ORAL | 3 refills | Status: DC
  Filled 2023-08-09: qty 30, 30d supply, fill #0

## 2023-08-10 ENCOUNTER — Other Ambulatory Visit: Payer: Self-pay

## 2023-08-10 ENCOUNTER — Other Ambulatory Visit (HOSPITAL_COMMUNITY): Payer: Self-pay

## 2023-08-10 DIAGNOSIS — Z6841 Body Mass Index (BMI) 40.0 and over, adult: Secondary | ICD-10-CM | POA: Diagnosis not present

## 2023-08-10 DIAGNOSIS — N182 Chronic kidney disease, stage 2 (mild): Secondary | ICD-10-CM | POA: Diagnosis not present

## 2023-08-10 DIAGNOSIS — E1142 Type 2 diabetes mellitus with diabetic polyneuropathy: Secondary | ICD-10-CM | POA: Diagnosis not present

## 2023-08-10 DIAGNOSIS — I10 Essential (primary) hypertension: Secondary | ICD-10-CM | POA: Diagnosis not present

## 2023-08-10 DIAGNOSIS — R600 Localized edema: Secondary | ICD-10-CM | POA: Diagnosis not present

## 2023-08-10 DIAGNOSIS — E1122 Type 2 diabetes mellitus with diabetic chronic kidney disease: Secondary | ICD-10-CM | POA: Diagnosis not present

## 2023-08-10 DIAGNOSIS — Z7182 Exercise counseling: Secondary | ICD-10-CM | POA: Diagnosis not present

## 2023-08-10 DIAGNOSIS — E1169 Type 2 diabetes mellitus with other specified complication: Secondary | ICD-10-CM | POA: Diagnosis not present

## 2023-08-10 DIAGNOSIS — I129 Hypertensive chronic kidney disease with stage 1 through stage 4 chronic kidney disease, or unspecified chronic kidney disease: Secondary | ICD-10-CM | POA: Diagnosis not present

## 2023-08-10 DIAGNOSIS — G40909 Epilepsy, unspecified, not intractable, without status epilepticus: Secondary | ICD-10-CM | POA: Diagnosis not present

## 2023-08-10 DIAGNOSIS — Z713 Dietary counseling and surveillance: Secondary | ICD-10-CM | POA: Diagnosis not present

## 2023-08-10 MED ORDER — PIOGLITAZONE HCL 30 MG PO TABS
30.0000 mg | ORAL_TABLET | Freq: Every day | ORAL | 5 refills | Status: DC
  Filled 2023-08-10 – 2023-08-12 (×3): qty 30, 30d supply, fill #0

## 2023-08-10 MED ORDER — MYRBETRIQ 25 MG PO TB24
25.0000 mg | ORAL_TABLET | Freq: Every day | ORAL | 5 refills | Status: DC
  Filled 2023-08-10 – 2023-08-12 (×3): qty 30, 30d supply, fill #0
  Filled 2023-09-13: qty 30, 30d supply, fill #1
  Filled 2023-10-08 – 2023-10-14 (×2): qty 30, 30d supply, fill #2
  Filled 2023-11-03 – 2023-11-10 (×3): qty 30, 30d supply, fill #3
  Filled 2023-12-07: qty 30, 30d supply, fill #4
  Filled 2024-01-10: qty 30, 30d supply, fill #5

## 2023-08-10 MED ORDER — OLMESARTAN MEDOXOMIL 20 MG PO TABS
20.0000 mg | ORAL_TABLET | Freq: Every day | ORAL | 5 refills | Status: DC
  Filled 2023-08-10 – 2023-08-12 (×3): qty 30, 30d supply, fill #0
  Filled 2023-09-13: qty 30, 30d supply, fill #1
  Filled 2023-10-08 – 2023-10-14 (×2): qty 30, 30d supply, fill #2
  Filled 2023-11-03 – 2023-11-10 (×3): qty 30, 30d supply, fill #3
  Filled 2023-12-07: qty 30, 30d supply, fill #4
  Filled 2024-01-10 – 2024-03-07 (×2): qty 30, 30d supply, fill #5

## 2023-08-10 MED ORDER — TRESIBA FLEXTOUCH 200 UNIT/ML ~~LOC~~ SOPN
20.0000 [IU] | PEN_INJECTOR | Freq: Every morning | SUBCUTANEOUS | 5 refills | Status: AC
  Filled 2023-08-10 – 2023-08-12 (×3): qty 6, 60d supply, fill #0
  Filled 2023-10-12: qty 6, 60d supply, fill #1

## 2023-08-10 MED ORDER — ROSUVASTATIN CALCIUM 20 MG PO TABS
20.0000 mg | ORAL_TABLET | Freq: Every day | ORAL | 5 refills | Status: DC
  Filled 2023-08-10 – 2023-08-12 (×3): qty 30, 30d supply, fill #0
  Filled 2023-09-13: qty 30, 30d supply, fill #1
  Filled 2023-10-08 – 2023-10-14 (×2): qty 30, 30d supply, fill #2
  Filled 2023-11-03 – 2023-11-10 (×3): qty 30, 30d supply, fill #3
  Filled 2023-12-07: qty 30, 30d supply, fill #4
  Filled 2024-01-10: qty 30, 30d supply, fill #5

## 2023-08-11 ENCOUNTER — Other Ambulatory Visit (HOSPITAL_COMMUNITY): Payer: Self-pay

## 2023-08-11 ENCOUNTER — Other Ambulatory Visit: Payer: Self-pay

## 2023-08-12 ENCOUNTER — Other Ambulatory Visit: Payer: Self-pay

## 2023-08-12 ENCOUNTER — Other Ambulatory Visit (HOSPITAL_COMMUNITY): Payer: Self-pay

## 2023-08-12 MED ORDER — BRIVIACT 50 MG PO TABS
50.0000 mg | ORAL_TABLET | Freq: Two times a day (BID) | ORAL | 1 refills | Status: DC
  Filled 2023-08-12 – 2023-08-19 (×3): qty 60, 30d supply, fill #0
  Filled 2023-09-13 – 2023-09-17 (×2): qty 60, 30d supply, fill #1

## 2023-08-17 ENCOUNTER — Other Ambulatory Visit (HOSPITAL_COMMUNITY): Payer: Self-pay

## 2023-08-18 ENCOUNTER — Other Ambulatory Visit: Payer: Self-pay

## 2023-08-18 ENCOUNTER — Other Ambulatory Visit (HOSPITAL_COMMUNITY): Payer: Self-pay

## 2023-08-19 ENCOUNTER — Other Ambulatory Visit (HOSPITAL_COMMUNITY): Payer: Self-pay

## 2023-08-20 ENCOUNTER — Other Ambulatory Visit: Payer: Self-pay

## 2023-08-20 ENCOUNTER — Other Ambulatory Visit (HOSPITAL_COMMUNITY): Payer: Self-pay

## 2023-08-23 ENCOUNTER — Other Ambulatory Visit: Payer: Self-pay

## 2023-08-23 DIAGNOSIS — E1140 Type 2 diabetes mellitus with diabetic neuropathy, unspecified: Secondary | ICD-10-CM | POA: Diagnosis not present

## 2023-08-23 DIAGNOSIS — L110 Acquired keratosis follicularis: Secondary | ICD-10-CM | POA: Diagnosis not present

## 2023-08-23 DIAGNOSIS — M79672 Pain in left foot: Secondary | ICD-10-CM | POA: Diagnosis not present

## 2023-08-23 DIAGNOSIS — M79671 Pain in right foot: Secondary | ICD-10-CM | POA: Diagnosis not present

## 2023-08-25 ENCOUNTER — Other Ambulatory Visit: Payer: Self-pay

## 2023-08-31 DIAGNOSIS — R21 Rash and other nonspecific skin eruption: Secondary | ICD-10-CM | POA: Diagnosis not present

## 2023-08-31 DIAGNOSIS — L299 Pruritus, unspecified: Secondary | ICD-10-CM | POA: Diagnosis not present

## 2023-08-31 DIAGNOSIS — L2989 Other pruritus: Secondary | ICD-10-CM | POA: Diagnosis not present

## 2023-09-01 ENCOUNTER — Other Ambulatory Visit (HOSPITAL_COMMUNITY): Payer: Self-pay

## 2023-09-01 ENCOUNTER — Other Ambulatory Visit: Payer: Self-pay

## 2023-09-01 MED ORDER — FAMOTIDINE 20 MG PO TABS
20.0000 mg | ORAL_TABLET | Freq: Every day | ORAL | 0 refills | Status: DC
  Filled 2023-09-01 – 2023-10-08 (×2): qty 15, 15d supply, fill #0

## 2023-09-01 MED ORDER — HYDROXYZINE HCL 10 MG PO TABS
10.0000 mg | ORAL_TABLET | Freq: Three times a day (TID) | ORAL | 1 refills | Status: AC | PRN
  Filled 2023-09-01: qty 45, 15d supply, fill #0

## 2023-09-02 ENCOUNTER — Other Ambulatory Visit: Payer: Self-pay

## 2023-09-02 ENCOUNTER — Other Ambulatory Visit (HOSPITAL_COMMUNITY): Payer: Self-pay

## 2023-09-06 ENCOUNTER — Other Ambulatory Visit: Payer: Self-pay

## 2023-09-06 ENCOUNTER — Other Ambulatory Visit (HOSPITAL_COMMUNITY): Payer: Self-pay

## 2023-09-07 ENCOUNTER — Other Ambulatory Visit (HOSPITAL_COMMUNITY): Payer: Self-pay

## 2023-09-07 ENCOUNTER — Other Ambulatory Visit: Payer: Self-pay

## 2023-09-13 ENCOUNTER — Other Ambulatory Visit: Payer: Self-pay

## 2023-09-13 ENCOUNTER — Other Ambulatory Visit (HOSPITAL_COMMUNITY): Payer: Self-pay

## 2023-09-14 ENCOUNTER — Other Ambulatory Visit: Payer: Self-pay

## 2023-09-15 ENCOUNTER — Other Ambulatory Visit: Payer: Self-pay

## 2023-09-15 ENCOUNTER — Other Ambulatory Visit (HOSPITAL_COMMUNITY): Payer: Self-pay

## 2023-09-15 MED ORDER — KP WOMENS 50+ DAILY FORMULA PO TABS
1.0000 | ORAL_TABLET | Freq: Every day | ORAL | 11 refills | Status: AC
  Filled 2023-09-15 – 2023-10-14 (×3): qty 30, 30d supply, fill #0
  Filled 2023-11-03 – 2023-11-10 (×3): qty 30, 30d supply, fill #1
  Filled 2023-12-07: qty 30, 30d supply, fill #2
  Filled 2024-01-10: qty 30, 30d supply, fill #3
  Filled 2024-02-03: qty 30, 30d supply, fill #4
  Filled 2024-03-07: qty 30, 30d supply, fill #5
  Filled 2024-04-06: qty 30, 30d supply, fill #6

## 2023-09-15 MED ORDER — FISH OIL 1000 MG PO CAPS
1000.0000 mg | ORAL_CAPSULE | Freq: Two times a day (BID) | ORAL | 11 refills | Status: AC
  Filled 2023-09-15 – 2023-10-14 (×3): qty 60, 30d supply, fill #0
  Filled 2023-11-03 – 2023-11-10 (×3): qty 60, 30d supply, fill #1
  Filled 2023-12-07: qty 60, 30d supply, fill #2
  Filled 2024-01-10: qty 60, 30d supply, fill #3
  Filled 2024-02-03: qty 60, 30d supply, fill #4
  Filled 2024-03-07: qty 60, 30d supply, fill #5
  Filled 2024-04-06: qty 60, 30d supply, fill #6

## 2023-09-15 MED ORDER — ACIDOPHILUS PO CAPS
1.0000 | ORAL_CAPSULE | Freq: Every day | ORAL | 11 refills | Status: AC
  Filled 2023-09-15 – 2023-10-14 (×3): qty 30, 30d supply, fill #0
  Filled 2023-11-03 – 2023-11-10 (×3): qty 30, 30d supply, fill #1
  Filled 2023-12-07: qty 30, 30d supply, fill #2
  Filled 2024-01-10: qty 30, 30d supply, fill #3
  Filled 2024-02-03: qty 30, 30d supply, fill #4

## 2023-09-15 MED ORDER — CALCIUM CARB-CHOLECALCIFEROL 600-10 MG-MCG PO TABS
1.0000 | ORAL_TABLET | Freq: Two times a day (BID) | ORAL | 11 refills | Status: AC
  Filled 2023-09-15 – 2023-10-14 (×3): qty 60, 30d supply, fill #0
  Filled 2023-11-03 – 2023-11-10 (×3): qty 60, 30d supply, fill #1
  Filled 2023-12-07: qty 60, 30d supply, fill #2
  Filled 2024-01-10: qty 60, 30d supply, fill #3
  Filled 2024-02-03: qty 60, 30d supply, fill #4
  Filled 2024-03-07: qty 60, 30d supply, fill #5
  Filled 2024-04-06: qty 60, 30d supply, fill #6

## 2023-09-16 ENCOUNTER — Other Ambulatory Visit: Payer: Self-pay

## 2023-09-17 ENCOUNTER — Other Ambulatory Visit: Payer: Self-pay

## 2023-09-20 ENCOUNTER — Other Ambulatory Visit: Payer: Self-pay

## 2023-09-23 ENCOUNTER — Other Ambulatory Visit: Payer: Self-pay

## 2023-10-04 DIAGNOSIS — E782 Mixed hyperlipidemia: Secondary | ICD-10-CM | POA: Diagnosis not present

## 2023-10-04 DIAGNOSIS — D509 Iron deficiency anemia, unspecified: Secondary | ICD-10-CM | POA: Diagnosis not present

## 2023-10-04 DIAGNOSIS — E1169 Type 2 diabetes mellitus with other specified complication: Secondary | ICD-10-CM | POA: Diagnosis not present

## 2023-10-04 DIAGNOSIS — G40909 Epilepsy, unspecified, not intractable, without status epilepticus: Secondary | ICD-10-CM | POA: Diagnosis not present

## 2023-10-08 ENCOUNTER — Other Ambulatory Visit (HOSPITAL_COMMUNITY): Payer: Self-pay

## 2023-10-08 ENCOUNTER — Other Ambulatory Visit: Payer: Self-pay

## 2023-10-08 MED ORDER — FAMOTIDINE 20 MG PO TABS
20.0000 mg | ORAL_TABLET | Freq: Every day | ORAL | 2 refills | Status: DC
  Filled 2023-10-08: qty 15, 15d supply, fill #0
  Filled 2023-10-11 – 2023-10-14 (×2): qty 30, 30d supply, fill #0

## 2023-10-09 ENCOUNTER — Other Ambulatory Visit (HOSPITAL_COMMUNITY): Payer: Self-pay

## 2023-10-11 ENCOUNTER — Other Ambulatory Visit: Payer: Self-pay

## 2023-10-12 ENCOUNTER — Other Ambulatory Visit (HOSPITAL_COMMUNITY): Payer: Self-pay

## 2023-10-12 ENCOUNTER — Other Ambulatory Visit: Payer: Self-pay

## 2023-10-12 MED ORDER — BRIVIACT 100 MG PO TABS
100.0000 mg | ORAL_TABLET | Freq: Two times a day (BID) | ORAL | 5 refills | Status: AC
  Filled 2023-10-12: qty 60, 30d supply, fill #0
  Filled 2023-11-03 – 2023-11-10 (×2): qty 60, 30d supply, fill #1
  Filled 2023-12-09: qty 60, 30d supply, fill #2
  Filled 2024-01-21: qty 60, 30d supply, fill #3
  Filled 2024-01-21: qty 60, 30d supply, fill #0
  Filled 2024-02-22: qty 60, 30d supply, fill #1
  Filled 2024-03-23: qty 60, 30d supply, fill #2

## 2023-10-12 MED ORDER — LAMOTRIGINE ER 200 MG PO TB24
1.0000 | ORAL_TABLET | Freq: Two times a day (BID) | ORAL | 11 refills | Status: AC
  Filled 2023-10-12 – 2023-10-14 (×2): qty 60, 30d supply, fill #0
  Filled 2023-11-03 – 2023-11-10 (×3): qty 60, 30d supply, fill #1
  Filled 2023-12-07: qty 60, 30d supply, fill #2
  Filled 2024-01-10: qty 60, 30d supply, fill #3
  Filled 2024-02-03: qty 60, 30d supply, fill #4
  Filled 2024-03-07: qty 60, 30d supply, fill #5
  Filled 2024-04-06: qty 60, 30d supply, fill #6

## 2023-10-12 MED ORDER — BRIVIACT 50 MG PO TABS
1.0000 | ORAL_TABLET | Freq: Two times a day (BID) | ORAL | 1 refills | Status: AC
  Filled 2023-10-12: qty 60, 30d supply, fill #0
  Filled 2023-11-10: qty 60, fill #0

## 2023-10-13 ENCOUNTER — Other Ambulatory Visit: Payer: Self-pay

## 2023-10-13 ENCOUNTER — Other Ambulatory Visit (HOSPITAL_COMMUNITY): Payer: Self-pay

## 2023-10-14 ENCOUNTER — Other Ambulatory Visit: Payer: Self-pay

## 2023-10-15 ENCOUNTER — Other Ambulatory Visit: Payer: Self-pay

## 2023-10-27 DIAGNOSIS — R569 Unspecified convulsions: Secondary | ICD-10-CM | POA: Diagnosis not present

## 2023-11-02 DIAGNOSIS — L110 Acquired keratosis follicularis: Secondary | ICD-10-CM | POA: Diagnosis not present

## 2023-11-02 DIAGNOSIS — M79672 Pain in left foot: Secondary | ICD-10-CM | POA: Diagnosis not present

## 2023-11-02 DIAGNOSIS — E1140 Type 2 diabetes mellitus with diabetic neuropathy, unspecified: Secondary | ICD-10-CM | POA: Diagnosis not present

## 2023-11-02 DIAGNOSIS — M79671 Pain in right foot: Secondary | ICD-10-CM | POA: Diagnosis not present

## 2023-11-03 ENCOUNTER — Other Ambulatory Visit: Payer: Self-pay

## 2023-11-03 DIAGNOSIS — E1142 Type 2 diabetes mellitus with diabetic polyneuropathy: Secondary | ICD-10-CM | POA: Diagnosis not present

## 2023-11-03 DIAGNOSIS — G40909 Epilepsy, unspecified, not intractable, without status epilepticus: Secondary | ICD-10-CM | POA: Diagnosis not present

## 2023-11-03 DIAGNOSIS — R519 Headache, unspecified: Secondary | ICD-10-CM | POA: Diagnosis not present

## 2023-11-03 DIAGNOSIS — E1165 Type 2 diabetes mellitus with hyperglycemia: Secondary | ICD-10-CM | POA: Diagnosis not present

## 2023-11-03 DIAGNOSIS — S0990XD Unspecified injury of head, subsequent encounter: Secondary | ICD-10-CM | POA: Diagnosis not present

## 2023-11-03 DIAGNOSIS — D509 Iron deficiency anemia, unspecified: Secondary | ICD-10-CM | POA: Diagnosis not present

## 2023-11-03 DIAGNOSIS — I10 Essential (primary) hypertension: Secondary | ICD-10-CM | POA: Diagnosis not present

## 2023-11-03 DIAGNOSIS — E1169 Type 2 diabetes mellitus with other specified complication: Secondary | ICD-10-CM | POA: Diagnosis not present

## 2023-11-03 DIAGNOSIS — Z23 Encounter for immunization: Secondary | ICD-10-CM | POA: Diagnosis not present

## 2023-11-03 DIAGNOSIS — I129 Hypertensive chronic kidney disease with stage 1 through stage 4 chronic kidney disease, or unspecified chronic kidney disease: Secondary | ICD-10-CM | POA: Diagnosis not present

## 2023-11-03 DIAGNOSIS — N182 Chronic kidney disease, stage 2 (mild): Secondary | ICD-10-CM | POA: Diagnosis not present

## 2023-11-03 DIAGNOSIS — G4733 Obstructive sleep apnea (adult) (pediatric): Secondary | ICD-10-CM | POA: Diagnosis not present

## 2023-11-09 ENCOUNTER — Other Ambulatory Visit: Payer: Self-pay

## 2023-11-09 ENCOUNTER — Other Ambulatory Visit (HOSPITAL_COMMUNITY): Payer: Self-pay

## 2023-11-10 ENCOUNTER — Other Ambulatory Visit: Payer: Self-pay

## 2023-11-10 ENCOUNTER — Other Ambulatory Visit (HOSPITAL_COMMUNITY): Payer: Self-pay

## 2023-11-10 MED ORDER — FAMOTIDINE 20 MG PO TABS
20.0000 mg | ORAL_TABLET | Freq: Every day | ORAL | 2 refills | Status: DC
  Filled 2023-11-10 – 2023-11-12 (×2): qty 15, 15d supply, fill #0
  Filled 2023-12-07: qty 30, 30d supply, fill #1

## 2023-11-11 ENCOUNTER — Other Ambulatory Visit: Payer: Self-pay

## 2023-11-12 ENCOUNTER — Other Ambulatory Visit: Payer: Self-pay

## 2023-11-12 ENCOUNTER — Other Ambulatory Visit (HOSPITAL_BASED_OUTPATIENT_CLINIC_OR_DEPARTMENT_OTHER): Payer: Self-pay

## 2023-11-15 DIAGNOSIS — G4733 Obstructive sleep apnea (adult) (pediatric): Secondary | ICD-10-CM | POA: Diagnosis not present

## 2023-11-15 DIAGNOSIS — H353122 Nonexudative age-related macular degeneration, left eye, intermediate dry stage: Secondary | ICD-10-CM | POA: Diagnosis not present

## 2023-11-15 DIAGNOSIS — H353221 Exudative age-related macular degeneration, left eye, with active choroidal neovascularization: Secondary | ICD-10-CM | POA: Diagnosis not present

## 2023-11-15 DIAGNOSIS — H35722 Serous detachment of retinal pigment epithelium, left eye: Secondary | ICD-10-CM | POA: Diagnosis not present

## 2023-11-15 DIAGNOSIS — H353112 Nonexudative age-related macular degeneration, right eye, intermediate dry stage: Secondary | ICD-10-CM | POA: Diagnosis not present

## 2023-11-16 ENCOUNTER — Other Ambulatory Visit (HOSPITAL_COMMUNITY): Payer: Self-pay

## 2023-11-16 DIAGNOSIS — Z9989 Dependence on other enabling machines and devices: Secondary | ICD-10-CM | POA: Diagnosis not present

## 2023-11-16 DIAGNOSIS — J069 Acute upper respiratory infection, unspecified: Secondary | ICD-10-CM | POA: Diagnosis not present

## 2023-11-16 MED ORDER — AMOXICILLIN-POT CLAVULANATE 875-125 MG PO TABS
1.0000 | ORAL_TABLET | Freq: Two times a day (BID) | ORAL | 0 refills | Status: AC
  Filled 2023-11-16 – 2023-11-17 (×2): qty 14, 7d supply, fill #0

## 2023-11-16 MED ORDER — BENZONATATE 200 MG PO CAPS
200.0000 mg | ORAL_CAPSULE | Freq: Three times a day (TID) | ORAL | 0 refills | Status: AC | PRN
  Filled 2023-11-16 – 2023-11-17 (×2): qty 21, 7d supply, fill #0

## 2023-11-17 ENCOUNTER — Other Ambulatory Visit (HOSPITAL_BASED_OUTPATIENT_CLINIC_OR_DEPARTMENT_OTHER): Payer: Self-pay

## 2023-11-17 ENCOUNTER — Other Ambulatory Visit: Payer: Self-pay

## 2023-11-17 ENCOUNTER — Other Ambulatory Visit (HOSPITAL_COMMUNITY): Payer: Self-pay

## 2023-12-03 ENCOUNTER — Other Ambulatory Visit: Payer: Self-pay

## 2023-12-07 ENCOUNTER — Other Ambulatory Visit: Payer: Self-pay

## 2023-12-08 ENCOUNTER — Other Ambulatory Visit: Payer: Self-pay

## 2023-12-09 ENCOUNTER — Other Ambulatory Visit: Payer: Self-pay

## 2023-12-09 ENCOUNTER — Other Ambulatory Visit (HOSPITAL_COMMUNITY): Payer: Self-pay

## 2023-12-13 ENCOUNTER — Other Ambulatory Visit: Payer: Self-pay

## 2023-12-13 DIAGNOSIS — I10 Essential (primary) hypertension: Secondary | ICD-10-CM | POA: Diagnosis not present

## 2023-12-13 DIAGNOSIS — W1839XA Other fall on same level, initial encounter: Secondary | ICD-10-CM | POA: Diagnosis not present

## 2023-12-13 DIAGNOSIS — Z7984 Long term (current) use of oral hypoglycemic drugs: Secondary | ICD-10-CM | POA: Diagnosis not present

## 2023-12-13 DIAGNOSIS — Z888 Allergy status to other drugs, medicaments and biological substances status: Secondary | ICD-10-CM | POA: Diagnosis not present

## 2023-12-13 DIAGNOSIS — Z880 Allergy status to penicillin: Secondary | ICD-10-CM | POA: Diagnosis not present

## 2023-12-13 DIAGNOSIS — S0990XA Unspecified injury of head, initial encounter: Secondary | ICD-10-CM | POA: Diagnosis not present

## 2023-12-13 DIAGNOSIS — G40909 Epilepsy, unspecified, not intractable, without status epilepticus: Secondary | ICD-10-CM | POA: Diagnosis not present

## 2023-12-13 DIAGNOSIS — E119 Type 2 diabetes mellitus without complications: Secondary | ICD-10-CM | POA: Diagnosis not present

## 2023-12-13 DIAGNOSIS — Y92009 Unspecified place in unspecified non-institutional (private) residence as the place of occurrence of the external cause: Secondary | ICD-10-CM | POA: Diagnosis not present

## 2023-12-13 DIAGNOSIS — Z79899 Other long term (current) drug therapy: Secondary | ICD-10-CM | POA: Diagnosis not present

## 2023-12-13 DIAGNOSIS — Z91048 Other nonmedicinal substance allergy status: Secondary | ICD-10-CM | POA: Diagnosis not present

## 2023-12-13 DIAGNOSIS — G4733 Obstructive sleep apnea (adult) (pediatric): Secondary | ICD-10-CM | POA: Diagnosis not present

## 2023-12-13 DIAGNOSIS — R569 Unspecified convulsions: Secondary | ICD-10-CM | POA: Diagnosis not present

## 2023-12-13 DIAGNOSIS — E785 Hyperlipidemia, unspecified: Secondary | ICD-10-CM | POA: Diagnosis not present

## 2023-12-15 NOTE — Discharge Summary (Signed)
 Epilepsy Monitoring Service Discharge Summary  Patient Name: Maria Esparza  Medical Record Number: [id number] Date of Birth: 18-Jan-1946  Date Admitted: 12/13/2023  7:53 PM Date Discharged: 12/21/23 Attending Physician: Margette Donnice Fallow, MD  Primary Care Provider: Shona Rush, MD  Admission Diagnoses: Seizures   Discharge Diagnoses:  Principal Problem:   Seizures (CMS/HHS-HCC) Active Problems:   Anxiety and depression   Essential (primary) hypertension   Hyperlipidemia, unspecified   Type 2 diabetes mellitus (CMS/HHS-HCC)   DDD (degenerative disc disease), lumbar Resolved Problems:   * No resolved hospital problems. *  History of Present Illness (may be updated from H&P based on information obtained during admission):  Maria Esparza is a 78 y.o. female with PMHx HTN, DM2, chronic low back pain, focal impaired consciousness seizures, OSA, who is being admitted to the epilepsy monitoring unit for spell characterization.   Patient has followed with Dr. Husain for focal onset seizures since 2022. Per initial clinic notes, her spells started in 2019. She was about to go to bed.  She sat at the edge of the bed and suddenly had a pain above her right eye, then said I am losing it and collapsed backward.  She then had flailing movements of her arm.  They did not appear to be tonic-clonic by description of the husband.  Episode lasted 45 seconds, then she was tired.  The same episodes have been occurring at a frequency of about 1 month, occasionally several per month.   Patient was last seen by Dr. Husain 10/27/23. At her last visit she reported having 1-2 focal seizures per month on average, which is her typical seizure frequency. Plan after this visit was to continue lamotrigine  and briviact  at current doses.   Since then, patient's husband has reached out to neurology clinic multiple times to report seizure spells occurring at home. On 8/28 he stated at 6am patient started  flailing her entire body, flopped on top of me in bed, kept stating she had to go to the bathroom lasted 2 minutes then she had 10 minutes of confusion, afterwards had crying and fatigue. Patient did not recall the event. ASMs were continued and patient was encouraged to get a CPAP for severe OSA. Then on 9/5 her husband reported she had another event where patient was hollering at the top of her lungs and flailing her arms and that her post-ictal confusion lasted hours which is much longer than usual. He also noted the episodes often occur out of sleep around 3-4am.   At this point plan was for EMU admission 10/29. On 9/30 patient's husband called neurology triage line to state patient had another seizure, she was moving her extremities similar to other episodes and kept repeating she had to go to the bathroom. Then on 10/8 patient's husband called again stating patient had multiple seizures over 45 minutes, he believes 15 events involving screaming, moaning/crying, upper body flailing lasting 3 minutes each, then patient would briefly come out of them for 30 seconds before having another spell. She additionally dove her head into the closet off of her bed and hit her head on the shelf, afterwards had a mild headache. She had not missed and ASM doses or had any recent infection. Had still not started CPAP. She had yet another event Saturday AM where she tried to use the restroom but walked backwards into a vanity and hit the back of her head on it. Based on increased seizure frequency with possible clustering, Dr. Ransom recommended urgent EMU  admission for spell characterization.   Epilepsy Patient Summary: Initial Evaluation: 10/29/2020 Handedness: Right Age of Onset of Seizures: 72 years  Risk Factors for Seizures: None Injuries with Seizures: Bruise and Fall(s) History of Status Epilepticus: no   Spell Types (and frequency): 1. Focal Onset with preserved awareness with motor onset and with  other motor activity.  Current Frequency: 1/month.  In these episodes the patient suddenly feels that she has a right supraorbital pain.  She then says, I am losing it.  Thereafter she has arm flailing movements.  Her eyes rolled up.  The entire episode lasted 45 seconds.  She is awake shortly thereafter but tired.  It takes her about 2 hours to recover from this episode.  More recently she no longer has a head pain prior to the episode. 2. Focal Onset with uncertain awareness pattern  with non-motor onset and with non-motor activity. Current Frequency: Only 2 or 3 of these have happened. In these episodes the patient does not have arm flailing but rather feels like her legs are weak and collapses to the floor.  Often she is aware of what is going on during these.   Previous ASMs: Vimpat  (increased falls) Keppra    Current ASMS/Relevant Meds: Briviact  100 mg bid Lamotrigine  XR 200 mg bid  Hospital Course: 1. 12/13/23-12/21/23: Patient was admitted to epilepsy monitoring service for prolonged EEG monitoring with video in order to characterize their spells. Upon admission ASMs were adjusted as follows: briviact  100 mg bid and lamotrigine  xr 200 mg bid were continued 10/13, then were reduced to briviact  50 mg bid and lamotrigine  xr 100 mg bid on 10/14, further reduced by 50% on 10/15, then discontinued on 10/16. The summarized EEG report is noted below, but in brief, there were epileptic discharges but no events captured during stay.  Patient was discharged with the following plan: Briviact  100 mg BID Lamotrigine  XR 200 mg BID  Discharge Exam:  Vital signs: Temp:  [36.6 C (97.9 F)-36.8 C (98.2 F)] 36.6 C (97.9 F) Heart Rate:  [73-80] 77 Resp:  [18-23] 18 BP: (87-131)/(50-71) 131/57 Temp (24hrs), Avg:36.7 C (98.1 F), Min:36.6 C (97.9 F), Max:36.8 C (98.2 F)  SpO2: 92 % Weight: (!) 106.6 kg (235 lb)  Gen: Well appearing and not in any distress. HEENT: Normocephalic. Atraumatic.  Oropharynx clear and moist. Pulm: Normal work of breathing. CV: Regular rate and rhythm on monitor. MSK: Appears well perfused without edema.  Neurologic Exam:  - MS: Awake, alert, and oriented to self, place, and time; appropriately interactive, normal affect.  - Speech: Fluent, not dysarthric.  - CN: Visual acuity grossly intact.  EOMI without nystagmus; no ptosis. Face symmetric without weakness. Hearing to voice intact. Voice normal. Tongue protrudes midline. - Motor: Normal bulk and tone. Antigravity in all extremities.  - Sensory: Deferred - Coordination: Grossly intact.  - Reflexes: Deferred - Gait: Deferred.   EEG: Prolonged EEG with video (12/13/23-12/21/23): During the course of this hospitalization, the interictal EEG showed: abundant sharp discharges in the right posterior region; maximal at O2>T6>O1>T5 and rare left temporal sharp discharges at T3-T5; maximal at T3. There were no events   Consult Orders: [facility] IP CASE MANAGEMENT - CONSULT TO CARE COORDINATION SPECIALIST [facility] IP CASE MANAGEMENT - CONSULT TO CARE COORDINATION SPECIALIST IP CONSULT TO BRT NURSE   Procedures/Surgeries Performed: EEG  Adverse Reactions: None  Discharge Disposition: Home  Discharge Condition: Procedure tolerated well and ready for discharge  Activity: Activity as tolerated  Diet: Regular  Allergies:  Allergies  Allergen Reactions  . Adhesive Itching    If placed too long  . Lisinopril Cough  . Penicillins Other (See Comments)    Vomiting, ge 10  Can take cephal  Did it involve swelling of the face/tongue/throat, SOB, or low BP? No  Did it involve sudden or severe rash/hives, skin peeling, or any reaction on the inside of your mouth or nose? No  Did you need to seek medical attention at a hospital or doctor's office? No  When did it last happen?        If all above answers are NO, may proceed with cephalosporin use.  Not specified  Childhood allergy  . Wound Dressings  Itching    If placed too long    Discharge Medications:    Current Discharge Medication List     CONTINUE taking these medications      Instructions  BD ULTRA-FINE NANO PEN NEEDLE 32 gauge x 5/32 Ndle Refills: 0 Generic drug: pen needle, diabetic    brivaracetam  100 mg tablet Quantity: 60 tablet Refills: 5  Commonly known as: BRIVIACT  Take 1 tablet (100 mg total) by mouth every 12 (twelve) hours Doctor's comments: Dose increase Last time this was given: 100 mg on December 21, 2023  8:34 AM   calcium  carbonate-vitamin D3 600 mg-5 mcg (200 unit) tablet Refills: 0  Commonly known as: CALTRATE 600+D Take 1 tablet by mouth 2 (two) times daily with meals Last time this was given: Ask your nurse or doctor   Fish OiL  1,000 (120-180) mg Cap Refills: 0 Generic drug: omega 3-dha-epa-fish oil   Take 1 capsule by mouth 2 (two) times daily   lamoTRIgine  200 mg XR tablet Quantity: 60 tablet Refills: 11  Commonly known as: LaMICtal  XR Take 1 tablet (200 mg total) by mouth 2 (two) times daily Last time this was given: 200 mg on December 21, 2023  8:35 AM   metFORMIN  1000 MG tablet Refills: 0  Commonly known as: GLUCOPHAGE  Take 500 mg by mouth at bedtime Last time this was given: 500 mg on December 20, 2023  8:16 PM   olmesartan  20 MG tablet Refills: 0  Commonly known as: BENICAR  Take 20 mg by mouth every morning   OZEMPIC SUBQ Refills: 0  Inject subcutaneously   PROBIOTIC ACIDOPHILUS ORAL Refills: 0  Take 1 capsule by mouth every morning   rosuvastatin  20 MG tablet Refills: 0  Commonly known as: CRESTOR  Take 20 mg by mouth once daily Last time this was given: 20 mg on December 21, 2023  8:34 AM   TRESIBA  FLEXTOUCH U-200 pen injector (concentration 200 units/mL) Refills: 0 Generic drug: insulin  DEGLUDEC  Inject 20 Units subcutaneously once daily       STOP taking these medications    insulin  LISPRO 100 unit/mL injection Commonly known as: AdmeLOG, HumaLOG        ASK your doctor about these medications      Instructions  oxyBUTYnin  5 MG XL tablet Refills: 0  Commonly known as: DITROPAN -XL Take 5 mg by mouth once daily Last time this was given: 5 mg on December 21, 2023  8:34 AM   pioglitazone  30 MG tablet Refills: 0  Commonly known as: ACTOS  Take 30 mg by mouth once daily Last time this was given: 30 mg on December 21, 2023  8:34 AM   WOMEN'S 50 PLUS MULTIVITAMIN 400 mcg-500 mg calcium -20 mcg Tab Refills: 0 Generic drug: mv-min-folic-calcium  carb-K1  Take 1 tablet by mouth every morning  Results Pending at Discharge: Unresulted Labs (From admission, onward)    None       Follow-up Tests Recommended:  none  Future Appointments  Date Time Provider Department Center  05/17/2024 11:00 AM Ransom Corrine HERO, MD [facility]    Note for outside providers, please use the following [facility] numbers for pending tests: Laboratory - Please Call: [phone] Microbiology - Please Call: [phone] Pathology - Please Call: [phone]  Time Spent on Discharge Care: 30 minutes  NZITA MERDI LUTETE, MD 12/21/2023 11:35 AM  ------------------------------------------------------------------------------- Attestation signed by Margette Donnice Fallow, MD at 12/21/2023  6:28 PM Attestation Statement:   I personally saw and evaluated the patient, and participated in the management and treatment plan as documented in the resident/fellow note.  MATTHEW FALLOW MARGETTE, MD  -------------------------------------------------------------------------------

## 2023-12-20 NOTE — Progress Notes (Signed)
 Epilepsy Monitoring Service Progress Note  Patient ID: Maria Esparza is a 78 y.o. female with PMHx HTN, DM2, chronic low back pain, focal impaired consciousness seizures, OSA, who is being admitted to the epilepsy monitoring unit for spell characterization.   Interval History: -No clinical events thus far -continue to monitor -plan to restart home ASM today  -Patient would prefer to not have an ambulatory EEG upon d/c   Spell Types: 1. Focal Onset with preserved awareness with motor onset and with other motor activity.  Current Frequency: 1-2/month.  In these episodes the patient suddenly feels that she has a right supraorbital pain.  She then says, I am losing it.  Thereafter she has arm flailing movements.  Her eyes rolled up.  The entire episode lasted 45 seconds.  She is awake shortly thereafter but tired.  It takes her about 2 hours to recover from this episode.  More recently she no longer has a head pain prior to the episode. 2. Focal Onset with uncertain awareness pattern  with non-motor onset and with non-motor activity. Current Frequency: Only 2 or 3 of these have happened. In these episodes the patient does not have arm flailing but rather feels like her legs are weak and collapses to the floor.  Often she is aware of what is going on during these. 3. Similar to type 1 involving bilateral arm flailing but also with screaming, patient stating she has to use the restroom, +/- jumping off the bed  Home ASMs: Briviact  100 mg bid Lamotrigine  XR 200 mg bid  Allergies: Allergies  Allergen Reactions  . Adhesive Itching    If placed too long  . Lisinopril Cough  . Penicillins Other (See Comments)    Vomiting, ge 10  Can take cephal  Did it involve swelling of the face/tongue/throat, SOB, or low BP? No  Did it involve sudden or severe rash/hives, skin peeling, or any reaction on the inside of your mouth or nose? No  Did you need to seek medical attention at a hospital or  doctor's office? No  When did it last happen?        If all above answers are NO, may proceed with cephalosporin use.  Not specified  Childhood allergy  . Wound Dressings Itching    If placed too long    Inpatient Medications: Scheduled Meds: . calcium  carbonate-vitamin D3  1 tablet Oral BID CC  . insulin  GLARGINE-yfgn  20 Units Subcutaneous Daily  . lidocaine  1 patch Transdermal Q24H  . losartan   50 mg Oral Daily  . metFORMIN   500 mg Oral QHS  . oxyBUTYnin   5 mg Oral Daily  . pioglitazone   30 mg Oral Daily  . rosuvastatin   20 mg Oral Daily  . sodium chloride  0.9% flush  5 mL Intravenous Q12H SCH    Continuous Infusions:   PRN Meds: dextrose  50% in water , glucagon, hydrOXYzine  (ATARAX ,VISTARIL ) oral, lidocaine, LORazepam  Physical Exam: Temp:  [36.8 C (98.2 F)] 36.8 C (98.2 F) Heart Rate:  [79] 79 Resp:  [18-19] 19 BP: (96-147)/(57-82) 102/80 Temp (24hrs), Avg:36.8 C (98.2 F), Min:36.8 C (98.2 F), Max:36.8 C (98.2 F)  SpO2: 93 % Weight: (!) 106.6 kg (235 lb)  Gen: Well appearing and not in any distress.   Neurologic Exam:  - MS: Awake, alert appropriately interactive, normal affect.  - Speech: Fluent, not dysarthric.  - CN: Visual acuity grossly intact.  EOMI without nystagmus; no ptosis.  Face symmetric without weakness.  Hearing to voice  intact. Voice normal.  - Motor: Normal bulk. Antigravity in all extremities.  - Sensory: Deferred - Coordination: Grossly intact.  - Reflexes: Deferred - Gait: Deferred.     EEG: - Prolonged EEG with video ongoing: please refer to daily EEG reports  Assessment: Maria Esparza is a 78 y.o. female with PMHx HTN, DM2, chronic low back pain, focal impaired consciousness seizures, OSA, who is being admitted to the epilepsy monitoring unit for spell characterization.   Patient with known seizure disorder presents with increased frequency of spells and differing spell types, now including shouting and in one  instance involving a cluster of events with forward dive from her bed onto the floor. Recommend EMU admission for urgent spell characterization given unclear etiology of these events.   Given multiple recent falls with head strike at home, [facility] was obtained on admission and revealed no acute findings or signs of trauma.  Plan: - Continue long term video monitoring for spell characterization. - Adjust ASMs as follows:  - Briviact  100 mg bid > 50 mg bid 10/14 > 25 mg bid 10/15 > discontinued 10/16 PM, restarted on 10/20 - Lamotrigine  XR 200 mg bid > 100 mg XR bid 10/14 > 50 mg bid 10/15 > discontinued 10/16 PM, restarted on 10/20 - Continue other home medications. - Continuous family observation during the visit. - Bedrest with bathroom privileges. - PIV in place for emergent meds. - Neuro checks per floor routine. - Seizure precautions.  Prophylaxis: SCDs. Diet: Regular. Code Status: Full Code  ALFONSO DAWNA TOR TREY, MD [phone] (EMU resident) 12/20/2023 1:36 PM    Attestation Statement:   I personally saw and evaluated the patient, and participated in the management and treatment plan as documented in the resident/fellow note.  MATTHEW ELSIE CECIL, MD

## 2023-12-20 NOTE — Procedures (Signed)
 [facility] Epilepsy Monitoring Unit -- Video EEG Report Long Term Monitoring Interim Report Dates of Overall Study:   Start: 12/13/2023  End: 12/20/2023 Date/Time for Report:  Start: 12/19/2023, 0700  End: 12/20/2023, 0700 Type of Study: Phase I (scalp EEG) characterization.   Epilepsy Patient Summary: Initial Evaluation: 10/29/2020 Handedness: Right Age of Onset of Seizures: 72 years  Risk Factors for Seizures: None Injuries with Seizures: Bruise and Fall(s) History of Status Epilepticus: no  Spell Types (and frequency): 1. Focal Onset with preserved awareness with motor onset and with other motor activity.  Current Frequency: 1/month.  In these episodes the patient suddenly feels that she has a right supraorbital pain.  She then says, I am losing it.  Thereafter she has arm flailing movements.  Her eyes rolled up.  The entire episode lasted 45 seconds.  She is awake shortly thereafter but tired.  It takes her about 2 hours to recover from this episode.  More recently she no longer has a head pain prior to the episode. 2. Focal Onset with uncertain awareness pattern  with non-motor onset and with non-motor activity. Current Frequency: Only 2 or 3 of these have happened. In these episodes the patient does not have arm flailing but rather feels like her legs are weak and collapses to the floor.  Often she is aware of what is going on during these.  Previous ASMs: Vimpat  (increased falls) Keppra  Current ASMs/Relevant Meds: Briviact  100 mg bid Lamotrigine  XR 200 mg bid Prior Imaging:  MRI brain with and without contrast 05/08/2020 [facility]) Stable moderate atrophy and moderate chronic small vessel ischemic  disease. No acute findings.  PET scan: N/A Ictal SPECT: N/A fMRI: N/A Prior EEG Studies: Routine EEG: N/A Ambulatory EEG: N/A Video EEG:  05/10/2020-05/14/2020 Long Term EEG ([facility])  EEG Interpretation:  Interictal: Sharp waves rare left and right  temporal, during sleep  Ictal: No events or seizures.   Clinical Correlation: The inability to capture events precludes a definitive diagnosis for the spells.  The rare sharp waves seen during sleep which were poorly formed may indicate a predisposition towards epilepsy however clinical correlation and video EEG with spell capture may still be necessary. Neuropsychiatric Evaluation: N/A Wada Test: N/A Physical Exam (abnormal findings only): None, please refer to EMU H&P, Progress notes for further details ________________________________________________________________________ RECORDING DATES: 12/13/2023 - 12/20/2023 Technical Description: This EEG was acquired using cable telemetry and a minimum of 16 EEG channels were used. This EEG was acquired with electrodes placed according to the International 10-20 electrode placement system. The following electrodes were missing, displaced or added: N/A.  The patient and family were instructed to activate the push-button for any events. The EEG was reviewed for all events.  The entire sleep period and randomly selected samples of the remainder of the tracing were reviewed.  Video and audio were reviewed for push-button events and other periods of interest.   Interictal EEG: The occipital dominant rhythm was 9 Hz. This activity is reactive to stimulation.  Present in the anterior head region is a 15-20 Hz beta activity. Drowsiness and sleep were manifested by background fragmentation, vertex waves, K-complexes, and sleep spindles. There was no focal slowing. Interictal discharges consisting of sharp discharges were located over the right posterior region that is maximal at O2>T6>O1>T5; these were abundant and most prominent during sleep. There were also rare left temporal sharp discharges at T3-T5; maximal at T3 - these were also prominent during sleep . There were no electrographic  seizures identified.  No abnormal response to photic stimulation was present. No  abnormal response to hyperventilation was present.       Events: No events yet  Summary of LTM: During the course of this hospitalization, the interictal EEG showed: abundant sharp discharges in the right posterior region; maximal at O2>T6>O1>T5 and rare left temporal sharp discharges at T3-T5; maximal at T3. There were no events Conclusion/Plan of LTM: Continue to monitor. Restart home ASMs with plan to discharge tomorrow. Conference: The patient will not need to be discussed at epilepsy conference.     ANA SOFIA GAILEN EHLERS, MD PGY-5, Epilepsy Fellow 12/20/2023 6:34 PM  I have reviewed the results of this test and agree with the findings documented in the resident/fellow note as above.  MATTHEW ELSIE CECIL, MD

## 2023-12-20 NOTE — Progress Notes (Signed)
 Initial visit for providing spiritual and emotional support for patient on [employer] Patient and family strongly profess to the [employer] faith Tradition. Patient had a long chat with Chaplain. At the end of chat, Patient requested and received the Eucharist and prayer for healing and comfort. The Patient and family were s appreciative of the visit and company.  Cleora Mansfield Laurena Cathern [phone]  12/20/23 1433  Visit Information  Visit Type Family meeting  Who was Visited? Patient;Family  Chaplain Making Visit Senior Staff  Receptivity Needs met  Spiritual/Pastoral Assessment  Spiritual Concerns Fear  Ministry Interventions Compassionate presence;Continued presence and follow-up;Prayer;Reconciliation with self/others;Use of storytelling;Spiritual Practice Interventions  OTHER  Consulted With Nurse  Visit Requested By Elia initiated  Referral Made To [facility] chaplain  Time Log 0.50

## 2023-12-21 NOTE — Progress Notes (Signed)
 Case Manager Discharge Summary / Closing Note  Expected Discharge Date & Time: 12/21/2023 at   Discharge Plan:  Patient is discharging home with routine care and no post-acute services were indicated prior to discharge.    Post-Acute Services Coordinated: No resources indicated at this time       Transportation: Arrangements: patient arranged Discharge Transportation: private vehicle  Final ADT:  Final ADT Discharge Disposition: Home Based  Home Based: Home or Self Care                 Second IMM Received: Provided to patient - in person (12/18/23 0935)  Final Summary: CM confirmed pt is medically ready for dc.  No post-acute service coordination needs identified.  Medical team ensures pt/family understands and agrees with dc plan.  Lamarr Etter Rucks, [place] ACM-SW Case Manager [facility] [phone]   KATHRYN  ROUNDTREE

## 2023-12-22 ENCOUNTER — Telehealth: Payer: Self-pay

## 2023-12-22 NOTE — Patient Instructions (Signed)
 Visit Information  Thank you for taking time to visit with me today.   Contact your neurologist for concerns or changes.   The patient verbalized understanding of instructions, educational materials, and care plan provided today and DECLINED offer to receive copy of patient instructions, educational materials, and care plan.   The patient has been provided with contact information for the care management team and has been advised to call with any health related questions or concerns.   Please call the care guide team at [phone] if you need to cancel or reschedule your appointment.   Please call the Suicide and Crisis Lifeline: 988 if you are experiencing a Mental Health or Behavioral Health Crisis or need someone to talk to.  Tania Steinhauser J. Khyran Riera RN, MSN [facility], [facility] RN Care Manager Direct Dial: [phone]  Fax: [phone] Website: [email or web address]

## 2023-12-22 NOTE — Transitions of Care (Post Inpatient/ED Visit) (Signed)
   12/22/2023  Name: Maria Esparza [facility] MRN: [id number] DOB: [birth year]  Today's TOC FU Call Status: Today's TOC FU Call Status:: Successful TOC FU Call Completed TOC FU Call Complete Date: 12/22/23 Patient's Name and Date of Birth confirmed.  Transition Care Management Follow-up Telephone Call Date of Discharge: 12/21/23 Discharge Facility: Other ([facility] Facility) Name of Other ([facility]) Discharge Facility: [facility] Type of Discharge: Inpatient Admission Primary Inpatient Discharge Diagnosis:: Seizures How have you been since you were released from the hospital?: Better Any questions or concerns?: No  Items Reviewed: Did you receive and understand the discharge instructions provided?: Yes Medications obtained,verified, and reconciled?: No Medications Not Reviewed Reasons:: Other: (Patient reports she does not know what she takes.  She states she gets medication from [facility] in pill packs.) Any new allergies since your discharge?: No Dietary orders reviewed?: Yes Type of Diet Ordered:: Low sodium heart healthy Do you have support at home?: Yes People in Home [RPT]: spouse Name of Support/Comfort Primary Source: [name]  Medications Reviewed Today: Patient unable to review medications as she receives pil packs Medications Reviewed Today   Medications were not reviewed in this encounter     Home Care and Equipment/Supplies: Were Home Health Services Ordered?: NA Any new equipment or medical supplies ordered?: NA  Functional Questionnaire: Do you need assistance with bathing/showering or dressing?: Yes Do you need assistance with meal preparation?: No Do you need assistance with eating?: No Do you have difficulty maintaining continence: No Do you need assistance with getting out of bed/getting out of a chair/moving?: No Do you have difficulty managing or taking your medications?: No  Follow up appointments reviewed: PCP Follow-up appointment  confirmed?: No MD Provider Line [phone] Given: No Specialist Hospital Follow-up appointment confirmed?: Yes Date of Specialist follow-up appointment?:  (Follow up with Neuro in March per patient) Follow-Up Specialty Provider:: [facility]- Neurology Do you need transportation to your follow-up appointment?: No Do you understand care options if your condition(s) worsen?: Yes-patient verbalized understanding  SDOH Interventions Today    Flowsheet Row Most Recent Value  SDOH Interventions   Food Insecurity Interventions Intervention Not Indicated  Housing Interventions Intervention Not Indicated  Transportation Interventions Intervention Not Indicated  Utilities Interventions Intervention Not Indicated    Leiland Mihelich J. Urbano Milhouse RN, MSN [facility]  [facility], [facility] RN Care Manager Direct Dial: [phone]  Fax: [phone] Website: [email or web address]

## 2024-01-03 ENCOUNTER — Other Ambulatory Visit (HOSPITAL_COMMUNITY): Payer: Self-pay | Admitting: Internal Medicine

## 2024-01-03 ENCOUNTER — Encounter (HOSPITAL_COMMUNITY): Payer: Self-pay | Admitting: Internal Medicine

## 2024-01-03 DIAGNOSIS — G4733 Obstructive sleep apnea (adult) (pediatric): Secondary | ICD-10-CM | POA: Diagnosis not present

## 2024-01-03 DIAGNOSIS — R0789 Other chest pain: Secondary | ICD-10-CM

## 2024-01-03 DIAGNOSIS — Z713 Dietary counseling and surveillance: Secondary | ICD-10-CM | POA: Diagnosis not present

## 2024-01-03 DIAGNOSIS — I10 Essential (primary) hypertension: Secondary | ICD-10-CM | POA: Diagnosis not present

## 2024-01-03 DIAGNOSIS — Z9989 Dependence on other enabling machines and devices: Secondary | ICD-10-CM | POA: Diagnosis not present

## 2024-01-03 DIAGNOSIS — Z6838 Body mass index (BMI) 38.0-38.9, adult: Secondary | ICD-10-CM | POA: Diagnosis not present

## 2024-01-03 DIAGNOSIS — Z87891 Personal history of nicotine dependence: Secondary | ICD-10-CM | POA: Diagnosis not present

## 2024-01-03 DIAGNOSIS — Z7182 Exercise counseling: Secondary | ICD-10-CM | POA: Diagnosis not present

## 2024-01-03 DIAGNOSIS — Z79899 Other long term (current) drug therapy: Secondary | ICD-10-CM | POA: Diagnosis not present

## 2024-01-06 DIAGNOSIS — E1165 Type 2 diabetes mellitus with hyperglycemia: Secondary | ICD-10-CM | POA: Diagnosis not present

## 2024-01-10 ENCOUNTER — Other Ambulatory Visit: Payer: Self-pay

## 2024-01-10 ENCOUNTER — Other Ambulatory Visit (HOSPITAL_COMMUNITY): Payer: Self-pay

## 2024-01-10 MED ORDER — FAMOTIDINE 20 MG PO TABS
20.0000 mg | ORAL_TABLET | Freq: Every day | ORAL | 2 refills | Status: DC
  Filled 2024-01-10: qty 30, 30d supply, fill #0
  Filled 2024-02-03: qty 30, 30d supply, fill #1

## 2024-01-11 ENCOUNTER — Other Ambulatory Visit: Payer: Self-pay

## 2024-01-11 DIAGNOSIS — H353122 Nonexudative age-related macular degeneration, left eye, intermediate dry stage: Secondary | ICD-10-CM | POA: Diagnosis not present

## 2024-01-11 DIAGNOSIS — M79672 Pain in left foot: Secondary | ICD-10-CM | POA: Diagnosis not present

## 2024-01-11 DIAGNOSIS — L110 Acquired keratosis follicularis: Secondary | ICD-10-CM | POA: Diagnosis not present

## 2024-01-11 DIAGNOSIS — M79671 Pain in right foot: Secondary | ICD-10-CM | POA: Diagnosis not present

## 2024-01-11 DIAGNOSIS — H35722 Serous detachment of retinal pigment epithelium, left eye: Secondary | ICD-10-CM | POA: Diagnosis not present

## 2024-01-11 DIAGNOSIS — E1140 Type 2 diabetes mellitus with diabetic neuropathy, unspecified: Secondary | ICD-10-CM | POA: Diagnosis not present

## 2024-01-11 DIAGNOSIS — H353112 Nonexudative age-related macular degeneration, right eye, intermediate dry stage: Secondary | ICD-10-CM | POA: Diagnosis not present

## 2024-01-11 DIAGNOSIS — H353221 Exudative age-related macular degeneration, left eye, with active choroidal neovascularization: Secondary | ICD-10-CM | POA: Diagnosis not present

## 2024-01-12 ENCOUNTER — Other Ambulatory Visit (HOSPITAL_COMMUNITY): Payer: Self-pay

## 2024-01-12 ENCOUNTER — Other Ambulatory Visit: Payer: Self-pay

## 2024-01-12 DIAGNOSIS — H353221 Exudative age-related macular degeneration, left eye, with active choroidal neovascularization: Secondary | ICD-10-CM | POA: Diagnosis not present

## 2024-01-12 DIAGNOSIS — H35722 Serous detachment of retinal pigment epithelium, left eye: Secondary | ICD-10-CM | POA: Diagnosis not present

## 2024-01-12 DIAGNOSIS — R0789 Other chest pain: Secondary | ICD-10-CM

## 2024-01-12 DIAGNOSIS — H353112 Nonexudative age-related macular degeneration, right eye, intermediate dry stage: Secondary | ICD-10-CM | POA: Diagnosis not present

## 2024-01-12 DIAGNOSIS — G4733 Obstructive sleep apnea (adult) (pediatric): Secondary | ICD-10-CM | POA: Diagnosis not present

## 2024-01-12 DIAGNOSIS — H353122 Nonexudative age-related macular degeneration, left eye, intermediate dry stage: Secondary | ICD-10-CM | POA: Diagnosis not present

## 2024-01-13 ENCOUNTER — Other Ambulatory Visit: Payer: Self-pay

## 2024-01-13 ENCOUNTER — Other Ambulatory Visit (HOSPITAL_COMMUNITY): Payer: Self-pay

## 2024-01-13 MED ORDER — FAMOTIDINE 20 MG PO TABS
20.0000 mg | ORAL_TABLET | Freq: Every day | ORAL | 2 refills | Status: DC
  Filled 2024-01-13: qty 30, 30d supply, fill #0

## 2024-01-14 ENCOUNTER — Other Ambulatory Visit: Payer: Self-pay

## 2024-01-14 ENCOUNTER — Other Ambulatory Visit (HOSPITAL_BASED_OUTPATIENT_CLINIC_OR_DEPARTMENT_OTHER): Payer: Self-pay

## 2024-01-14 ENCOUNTER — Other Ambulatory Visit (HOSPITAL_COMMUNITY): Payer: Self-pay

## 2024-01-14 ENCOUNTER — Other Ambulatory Visit (HOSPITAL_COMMUNITY): Payer: Self-pay | Admitting: Internal Medicine

## 2024-01-14 DIAGNOSIS — Z1231 Encounter for screening mammogram for malignant neoplasm of breast: Secondary | ICD-10-CM

## 2024-01-20 ENCOUNTER — Other Ambulatory Visit: Payer: Self-pay

## 2024-01-20 ENCOUNTER — Ambulatory Visit (HOSPITAL_COMMUNITY)
Admission: RE | Admit: 2024-01-20 | Discharge: 2024-01-20 | Disposition: A | Discharge status: Home / Self Care | Source: Ambulatory Visit | Attending: Internal Medicine | Admitting: Internal Medicine

## 2024-01-20 DIAGNOSIS — R918 Other nonspecific abnormal finding of lung field: Secondary | ICD-10-CM | POA: Diagnosis not present

## 2024-01-20 DIAGNOSIS — R0789 Other chest pain: Secondary | ICD-10-CM | POA: Diagnosis not present

## 2024-01-20 DIAGNOSIS — J479 Bronchiectasis, uncomplicated: Secondary | ICD-10-CM | POA: Diagnosis not present

## 2024-01-20 DIAGNOSIS — I700 Atherosclerosis of aorta: Secondary | ICD-10-CM | POA: Diagnosis not present

## 2024-01-20 DIAGNOSIS — R079 Chest pain, unspecified: Secondary | ICD-10-CM | POA: Diagnosis not present

## 2024-01-21 ENCOUNTER — Other Ambulatory Visit (HOSPITAL_COMMUNITY): Payer: Self-pay

## 2024-01-21 ENCOUNTER — Other Ambulatory Visit (HOSPITAL_BASED_OUTPATIENT_CLINIC_OR_DEPARTMENT_OTHER): Payer: Self-pay

## 2024-01-25 ENCOUNTER — Other Ambulatory Visit: Payer: Self-pay

## 2024-01-25 ENCOUNTER — Other Ambulatory Visit (HOSPITAL_COMMUNITY): Payer: Self-pay

## 2024-01-25 MED ORDER — METFORMIN HCL 500 MG PO TABS
500.0000 mg | ORAL_TABLET | Freq: Every day | ORAL | 5 refills | Status: AC
  Filled 2024-02-03: qty 30, 30d supply, fill #0
  Filled 2024-03-07: qty 30, 30d supply, fill #1
  Filled 2024-04-06: qty 30, 30d supply, fill #2

## 2024-01-25 MED ORDER — MYRBETRIQ 25 MG PO TB24
25.0000 mg | ORAL_TABLET | Freq: Every day | ORAL | 5 refills | Status: AC
  Filled 2024-02-03: qty 30, 30d supply, fill #0
  Filled 2024-03-07: qty 30, 30d supply, fill #1
  Filled 2024-04-06: qty 30, 30d supply, fill #2

## 2024-01-26 ENCOUNTER — Other Ambulatory Visit (HOSPITAL_COMMUNITY): Payer: Self-pay

## 2024-01-26 ENCOUNTER — Other Ambulatory Visit: Payer: Self-pay

## 2024-01-26 MED ORDER — ROSUVASTATIN CALCIUM 20 MG PO TABS
20.0000 mg | ORAL_TABLET | Freq: Every day | ORAL | 5 refills | Status: AC
  Filled 2024-01-26 – 2024-02-03 (×2): qty 30, 30d supply, fill #0
  Filled 2024-03-07: qty 30, 30d supply, fill #1
  Filled 2024-04-06: qty 30, 30d supply, fill #2

## 2024-01-28 ENCOUNTER — Other Ambulatory Visit: Payer: Self-pay

## 2024-02-03 ENCOUNTER — Other Ambulatory Visit (HOSPITAL_COMMUNITY): Payer: Self-pay

## 2024-02-03 ENCOUNTER — Other Ambulatory Visit: Payer: Self-pay

## 2024-02-04 ENCOUNTER — Other Ambulatory Visit: Payer: Self-pay

## 2024-02-04 ENCOUNTER — Other Ambulatory Visit (HOSPITAL_COMMUNITY): Payer: Self-pay

## 2024-02-04 MED ORDER — FAMOTIDINE 20 MG PO TABS
20.0000 mg | ORAL_TABLET | Freq: Every day | ORAL | 1 refills | Status: DC
  Filled 2024-02-04: qty 30, 30d supply, fill #0

## 2024-02-04 MED ORDER — FAMOTIDINE 20 MG PO TABS
20.0000 mg | ORAL_TABLET | Freq: Every day | ORAL | 2 refills | Status: AC
  Filled 2024-02-04: qty 30, 30d supply, fill #0

## 2024-02-05 DIAGNOSIS — E1165 Type 2 diabetes mellitus with hyperglycemia: Secondary | ICD-10-CM | POA: Diagnosis not present

## 2024-02-07 ENCOUNTER — Other Ambulatory Visit: Payer: Self-pay

## 2024-02-07 ENCOUNTER — Other Ambulatory Visit (HOSPITAL_COMMUNITY): Payer: Self-pay

## 2024-02-10 DIAGNOSIS — E1169 Type 2 diabetes mellitus with other specified complication: Secondary | ICD-10-CM | POA: Diagnosis not present

## 2024-02-10 DIAGNOSIS — G40909 Epilepsy, unspecified, not intractable, without status epilepticus: Secondary | ICD-10-CM | POA: Diagnosis not present

## 2024-02-10 DIAGNOSIS — D509 Iron deficiency anemia, unspecified: Secondary | ICD-10-CM | POA: Diagnosis not present

## 2024-02-10 DIAGNOSIS — E782 Mixed hyperlipidemia: Secondary | ICD-10-CM | POA: Diagnosis not present

## 2024-02-16 ENCOUNTER — Other Ambulatory Visit: Payer: Self-pay

## 2024-02-16 ENCOUNTER — Other Ambulatory Visit (HOSPITAL_COMMUNITY): Payer: Self-pay

## 2024-02-16 DIAGNOSIS — N3281 Overactive bladder: Secondary | ICD-10-CM | POA: Diagnosis not present

## 2024-02-16 DIAGNOSIS — E1169 Type 2 diabetes mellitus with other specified complication: Secondary | ICD-10-CM | POA: Diagnosis not present

## 2024-02-16 DIAGNOSIS — G4733 Obstructive sleep apnea (adult) (pediatric): Secondary | ICD-10-CM | POA: Diagnosis not present

## 2024-02-16 DIAGNOSIS — Z0000 Encounter for general adult medical examination without abnormal findings: Secondary | ICD-10-CM | POA: Diagnosis not present

## 2024-02-16 DIAGNOSIS — I10 Essential (primary) hypertension: Secondary | ICD-10-CM | POA: Diagnosis not present

## 2024-02-16 DIAGNOSIS — N182 Chronic kidney disease, stage 2 (mild): Secondary | ICD-10-CM | POA: Diagnosis not present

## 2024-02-16 DIAGNOSIS — E1122 Type 2 diabetes mellitus with diabetic chronic kidney disease: Secondary | ICD-10-CM | POA: Diagnosis not present

## 2024-02-16 DIAGNOSIS — D509 Iron deficiency anemia, unspecified: Secondary | ICD-10-CM | POA: Diagnosis not present

## 2024-02-16 DIAGNOSIS — Z0001 Encounter for general adult medical examination with abnormal findings: Secondary | ICD-10-CM | POA: Diagnosis not present

## 2024-02-16 DIAGNOSIS — I129 Hypertensive chronic kidney disease with stage 1 through stage 4 chronic kidney disease, or unspecified chronic kidney disease: Secondary | ICD-10-CM | POA: Diagnosis not present

## 2024-02-16 DIAGNOSIS — R0789 Other chest pain: Secondary | ICD-10-CM | POA: Diagnosis not present

## 2024-02-16 DIAGNOSIS — E1140 Type 2 diabetes mellitus with diabetic neuropathy, unspecified: Secondary | ICD-10-CM | POA: Diagnosis not present

## 2024-02-16 MED ORDER — TRESIBA FLEXTOUCH 200 UNIT/ML ~~LOC~~ SOPN
38.0000 [IU] | PEN_INJECTOR | Freq: Every morning | SUBCUTANEOUS | 5 refills | Status: AC
  Filled 2024-02-16: qty 18, 94d supply, fill #0

## 2024-02-17 ENCOUNTER — Other Ambulatory Visit (HOSPITAL_COMMUNITY): Payer: Self-pay

## 2024-02-19 DIAGNOSIS — G4733 Obstructive sleep apnea (adult) (pediatric): Secondary | ICD-10-CM | POA: Diagnosis not present

## 2024-02-21 ENCOUNTER — Ambulatory Visit (HOSPITAL_COMMUNITY)
Admission: RE | Admit: 2024-02-21 | Discharge: 2024-02-21 | Disposition: A | Discharge status: Home / Self Care | Source: Ambulatory Visit | Attending: Internal Medicine | Admitting: Internal Medicine

## 2024-02-21 ENCOUNTER — Other Ambulatory Visit (HOSPITAL_COMMUNITY): Payer: Self-pay

## 2024-02-21 ENCOUNTER — Encounter (HOSPITAL_COMMUNITY): Payer: Self-pay

## 2024-02-21 DIAGNOSIS — Z1231 Encounter for screening mammogram for malignant neoplasm of breast: Secondary | ICD-10-CM | POA: Insufficient documentation

## 2024-02-22 ENCOUNTER — Other Ambulatory Visit (HOSPITAL_BASED_OUTPATIENT_CLINIC_OR_DEPARTMENT_OTHER): Payer: Self-pay

## 2024-03-01 ENCOUNTER — Other Ambulatory Visit (HOSPITAL_BASED_OUTPATIENT_CLINIC_OR_DEPARTMENT_OTHER): Payer: Self-pay

## 2024-03-01 ENCOUNTER — Other Ambulatory Visit: Payer: Self-pay

## 2024-03-01 MED ORDER — CORICIDIN HBP 10-325-2 MG PO TABS: ORAL_TABLET | ORAL | 0 refills | Status: AC

## 2024-03-01 MED ORDER — ALBUTEROL SULFATE HFA 108 (90 BASE) MCG/ACT IN AERS
INHALATION_SPRAY | RESPIRATORY_TRACT | 0 refills | Status: AC
  Filled 2024-03-01: qty 6.7, 17d supply, fill #0

## 2024-03-01 MED ORDER — DIPHENOXYLATE-ATROPINE 2.5-0.025 MG PO TABS
2.0000 | ORAL_TABLET | Freq: Two times a day (BID) | ORAL | 0 refills | Status: AC
  Filled 2024-03-01: qty 16, 4d supply, fill #0

## 2024-03-01 MED ORDER — BENZONATATE 200 MG PO CAPS
200.0000 mg | ORAL_CAPSULE | Freq: Three times a day (TID) | ORAL | 0 refills | Status: AC | PRN
  Filled 2024-03-01: qty 21, 7d supply, fill #0

## 2024-03-07 ENCOUNTER — Other Ambulatory Visit: Payer: Self-pay

## 2024-03-08 ENCOUNTER — Other Ambulatory Visit: Payer: Self-pay

## 2024-03-09 ENCOUNTER — Other Ambulatory Visit: Payer: Self-pay

## 2024-03-13 ENCOUNTER — Other Ambulatory Visit (HOSPITAL_BASED_OUTPATIENT_CLINIC_OR_DEPARTMENT_OTHER): Payer: Self-pay

## 2024-03-13 ENCOUNTER — Other Ambulatory Visit (HOSPITAL_COMMUNITY): Payer: Self-pay

## 2024-03-13 ENCOUNTER — Other Ambulatory Visit: Payer: Self-pay

## 2024-03-14 ENCOUNTER — Ambulatory Visit (HOSPITAL_COMMUNITY)
Admission: RE | Admit: 2024-03-14 | Discharge: 2024-03-14 | Disposition: A | Discharge status: Home / Self Care | Source: Ambulatory Visit | Attending: Internal Medicine | Admitting: Internal Medicine

## 2024-03-14 DIAGNOSIS — R0789 Other chest pain: Secondary | ICD-10-CM | POA: Diagnosis present

## 2024-03-14 DIAGNOSIS — J988 Other specified respiratory disorders: Secondary | ICD-10-CM | POA: Diagnosis not present

## 2024-03-14 DIAGNOSIS — Z87891 Personal history of nicotine dependence: Secondary | ICD-10-CM | POA: Insufficient documentation

## 2024-03-14 LAB — PULMONARY FUNCTION TEST
DL/VA % pred: 76 %
DL/VA: 3.08 ml/min/mmHg/L
DLCO unc % pred: 56 %
DLCO unc: 11.54 ml/min/mmHg
FEF 25-75 Post: 1.19 L/s
FEF 25-75 Pre: 1.67 L/s
FEF2575-%Change-Post: -29 %
FEF2575-%Pred-Post: 72 %
FEF2575-%Pred-Pre: 102 %
FEV1-%Change-Post: -4 %
FEV1-%Pred-Post: 85 %
FEV1-%Pred-Pre: 90 %
FEV1-Post: 1.95 L
FEV1-Pre: 2.04 L
FEV1FVC-%Change-Post: 0 %
FEV1FVC-%Pred-Pre: 102 %
FEV6-%Change-Post: -6 %
FEV6-%Pred-Post: 87 %
FEV6-%Pred-Pre: 93 %
FEV6-Post: 2.52 L
FEV6-Pre: 2.7 L
FEV6FVC-%Change-Post: -1 %
FEV6FVC-%Pred-Post: 104 %
FEV6FVC-%Pred-Pre: 105 %
FVC-%Change-Post: -5 %
FVC-%Pred-Post: 84 %
FVC-%Pred-Pre: 89 %
FVC-Post: 2.55 L
FVC-Pre: 2.7 L
Post FEV1/FVC ratio: 76 %
Post FEV6/FVC ratio: 99 %
Pre FEV1/FVC ratio: 76 %
Pre FEV6/FVC Ratio: 100 %
RV % pred: 76 %
RV: 1.93 L
TLC % pred: 82 %
TLC: 4.52 L

## 2024-03-14 MED ORDER — ALBUTEROL SULFATE (2.5 MG/3ML) 0.083% IN NEBU
2.5000 mg | INHALATION_SOLUTION | Freq: Once | RESPIRATORY_TRACT | Status: AC
  Administered 2024-03-14: 2.5 mg via RESPIRATORY_TRACT

## 2024-03-23 ENCOUNTER — Other Ambulatory Visit (HOSPITAL_BASED_OUTPATIENT_CLINIC_OR_DEPARTMENT_OTHER): Payer: Self-pay

## 2024-03-23 ENCOUNTER — Other Ambulatory Visit: Payer: Self-pay

## 2024-03-23 ENCOUNTER — Other Ambulatory Visit (HOSPITAL_COMMUNITY): Payer: Self-pay

## 2024-04-06 ENCOUNTER — Other Ambulatory Visit (HOSPITAL_COMMUNITY): Payer: Self-pay

## 2024-04-06 ENCOUNTER — Other Ambulatory Visit: Payer: Self-pay

## 2024-04-06 MED ORDER — OLMESARTAN MEDOXOMIL 20 MG PO TABS
20.0000 mg | ORAL_TABLET | Freq: Every day | ORAL | 5 refills | Status: AC
  Filled 2024-04-06: qty 30, 30d supply, fill #0
